# Patient Record
Sex: Female | Born: 1955 | Race: Black or African American | Hispanic: No | Smoking: Former smoker
Health system: Southern US, Community
[De-identification: ages and names within clinical notes are randomized; demographics above are authoritative.]

## PROBLEM LIST (undated history)

## (undated) DIAGNOSIS — E785 Hyperlipidemia, unspecified: Secondary | ICD-10-CM

## (undated) DIAGNOSIS — F329 Major depressive disorder, single episode, unspecified: Secondary | ICD-10-CM

## (undated) DIAGNOSIS — D219 Benign neoplasm of connective and other soft tissue, unspecified: Secondary | ICD-10-CM

## (undated) DIAGNOSIS — R0789 Other chest pain: Secondary | ICD-10-CM

## (undated) DIAGNOSIS — K635 Polyp of colon: Secondary | ICD-10-CM

## (undated) DIAGNOSIS — G4730 Sleep apnea, unspecified: Secondary | ICD-10-CM

## (undated) DIAGNOSIS — T7840XA Allergy, unspecified, initial encounter: Secondary | ICD-10-CM

## (undated) DIAGNOSIS — F410 Panic disorder [episodic paroxysmal anxiety] without agoraphobia: Secondary | ICD-10-CM

## (undated) DIAGNOSIS — F32A Depression, unspecified: Secondary | ICD-10-CM

## (undated) DIAGNOSIS — I10 Essential (primary) hypertension: Secondary | ICD-10-CM

## (undated) HISTORY — DX: Hyperlipidemia, unspecified: E78.5

## (undated) HISTORY — PX: CATARACT EXTRACTION: SUR2

## (undated) HISTORY — DX: Essential (primary) hypertension: I10

## (undated) HISTORY — DX: Major depressive disorder, single episode, unspecified: F32.9

## (undated) HISTORY — DX: Panic disorder (episodic paroxysmal anxiety): F41.0

## (undated) HISTORY — DX: Polyp of colon: K63.5

## (undated) HISTORY — DX: Benign neoplasm of connective and other soft tissue, unspecified: D21.9

## (undated) HISTORY — DX: Allergy, unspecified, initial encounter: T78.40XA

## (undated) HISTORY — DX: Depression, unspecified: F32.A

## (undated) HISTORY — DX: Other chest pain: R07.89

## (undated) HISTORY — DX: Sleep apnea, unspecified: G47.30

---

## 1996-11-21 HISTORY — PX: ABDOMINAL HYSTERECTOMY: SHX81

## 1998-09-22 ENCOUNTER — Ambulatory Visit (HOSPITAL_COMMUNITY): Admission: RE | Admit: 1998-09-22 | Discharge: 1998-09-22 | Payer: Self-pay | Admitting: *Deleted

## 1998-09-22 ENCOUNTER — Encounter: Payer: Self-pay | Admitting: *Deleted

## 1998-10-06 ENCOUNTER — Encounter: Payer: Self-pay | Admitting: *Deleted

## 1998-10-06 ENCOUNTER — Ambulatory Visit (HOSPITAL_COMMUNITY): Admission: RE | Admit: 1998-10-06 | Discharge: 1998-10-06 | Payer: Self-pay | Admitting: *Deleted

## 2001-07-02 ENCOUNTER — Other Ambulatory Visit: Admission: RE | Admit: 2001-07-02 | Discharge: 2001-07-02 | Payer: Self-pay | Admitting: Obstetrics and Gynecology

## 2003-04-23 ENCOUNTER — Ambulatory Visit (HOSPITAL_BASED_OUTPATIENT_CLINIC_OR_DEPARTMENT_OTHER): Admission: RE | Admit: 2003-04-23 | Discharge: 2003-04-23 | Payer: Self-pay | Admitting: Orthopedic Surgery

## 2004-07-13 ENCOUNTER — Emergency Department (HOSPITAL_COMMUNITY): Admission: EM | Admit: 2004-07-13 | Discharge: 2004-07-13 | Payer: Self-pay | Admitting: Emergency Medicine

## 2005-02-22 ENCOUNTER — Ambulatory Visit (HOSPITAL_COMMUNITY): Admission: RE | Admit: 2005-02-22 | Discharge: 2005-02-22 | Payer: Self-pay | Admitting: Gastroenterology

## 2005-02-22 ENCOUNTER — Encounter (INDEPENDENT_AMBULATORY_CARE_PROVIDER_SITE_OTHER): Payer: Self-pay | Admitting: *Deleted

## 2005-11-21 HISTORY — PX: NASAL SINUS SURGERY: SHX719

## 2005-12-20 ENCOUNTER — Encounter: Admission: RE | Admit: 2005-12-20 | Discharge: 2005-12-20 | Payer: Self-pay | Admitting: Family Medicine

## 2005-12-20 ENCOUNTER — Ambulatory Visit: Payer: Self-pay | Admitting: Licensed Clinical Social Worker

## 2005-12-26 ENCOUNTER — Ambulatory Visit: Payer: Self-pay | Admitting: Licensed Clinical Social Worker

## 2006-01-02 ENCOUNTER — Ambulatory Visit: Payer: Self-pay | Admitting: Licensed Clinical Social Worker

## 2006-01-06 ENCOUNTER — Ambulatory Visit: Payer: Self-pay | Admitting: Licensed Clinical Social Worker

## 2006-01-13 ENCOUNTER — Ambulatory Visit: Payer: Self-pay | Admitting: Licensed Clinical Social Worker

## 2006-01-20 ENCOUNTER — Ambulatory Visit: Payer: Self-pay | Admitting: Licensed Clinical Social Worker

## 2006-01-27 ENCOUNTER — Ambulatory Visit: Payer: Self-pay | Admitting: Licensed Clinical Social Worker

## 2006-02-03 ENCOUNTER — Ambulatory Visit: Payer: Self-pay | Admitting: Licensed Clinical Social Worker

## 2006-02-10 ENCOUNTER — Ambulatory Visit: Payer: Self-pay | Admitting: Licensed Clinical Social Worker

## 2006-02-17 ENCOUNTER — Ambulatory Visit: Payer: Self-pay | Admitting: Licensed Clinical Social Worker

## 2006-02-23 ENCOUNTER — Ambulatory Visit: Payer: Self-pay | Admitting: Licensed Clinical Social Worker

## 2006-03-03 ENCOUNTER — Ambulatory Visit: Payer: Self-pay | Admitting: Licensed Clinical Social Worker

## 2006-03-17 ENCOUNTER — Ambulatory Visit: Payer: Self-pay | Admitting: Licensed Clinical Social Worker

## 2006-03-24 ENCOUNTER — Ambulatory Visit: Payer: Self-pay | Admitting: Licensed Clinical Social Worker

## 2006-03-29 ENCOUNTER — Ambulatory Visit: Payer: Self-pay | Admitting: Licensed Clinical Social Worker

## 2006-04-21 ENCOUNTER — Ambulatory Visit: Payer: Self-pay | Admitting: Licensed Clinical Social Worker

## 2006-05-05 ENCOUNTER — Ambulatory Visit: Payer: Self-pay | Admitting: Licensed Clinical Social Worker

## 2006-08-25 ENCOUNTER — Ambulatory Visit: Payer: Self-pay | Admitting: Family Medicine

## 2006-11-23 ENCOUNTER — Ambulatory Visit: Payer: Self-pay | Admitting: Licensed Clinical Social Worker

## 2006-12-21 ENCOUNTER — Ambulatory Visit: Payer: Self-pay | Admitting: Family Medicine

## 2007-01-19 ENCOUNTER — Ambulatory Visit: Payer: Self-pay | Admitting: Family Medicine

## 2007-01-20 DIAGNOSIS — R0789 Other chest pain: Secondary | ICD-10-CM

## 2007-01-20 HISTORY — DX: Other chest pain: R07.89

## 2007-01-25 ENCOUNTER — Ambulatory Visit: Payer: Self-pay | Admitting: Cardiovascular Disease

## 2007-01-26 ENCOUNTER — Ambulatory Visit: Payer: Self-pay | Admitting: Family Medicine

## 2007-01-26 LAB — CONVERTED CEMR LAB
Cholesterol: 204 mg/dL (ref 0–200)
Direct LDL: 140.5 mg/dL
HDL: 46.3 mg/dL (ref >39.0)
Total CHOL/HDL Ratio: 4.4
Triglycerides: 46 mg/dL (ref 0–149)
VLDL: 9 mg/dL (ref 0–40)

## 2007-02-02 ENCOUNTER — Ambulatory Visit: Payer: Self-pay

## 2007-06-08 ENCOUNTER — Ambulatory Visit: Payer: Self-pay | Admitting: Licensed Clinical Social Worker

## 2007-06-15 ENCOUNTER — Ambulatory Visit: Payer: Self-pay | Admitting: Licensed Clinical Social Worker

## 2007-06-22 ENCOUNTER — Ambulatory Visit: Payer: Self-pay | Admitting: Licensed Clinical Social Worker

## 2007-06-22 ENCOUNTER — Telehealth (INDEPENDENT_AMBULATORY_CARE_PROVIDER_SITE_OTHER): Payer: Self-pay | Admitting: *Deleted

## 2007-06-29 ENCOUNTER — Ambulatory Visit: Payer: Self-pay | Admitting: Licensed Clinical Social Worker

## 2007-07-06 ENCOUNTER — Ambulatory Visit: Payer: Self-pay | Admitting: Licensed Clinical Social Worker

## 2007-11-07 ENCOUNTER — Telehealth (INDEPENDENT_AMBULATORY_CARE_PROVIDER_SITE_OTHER): Payer: Self-pay | Admitting: *Deleted

## 2007-11-13 ENCOUNTER — Telehealth (INDEPENDENT_AMBULATORY_CARE_PROVIDER_SITE_OTHER): Payer: Self-pay | Admitting: Family Medicine

## 2007-11-13 ENCOUNTER — Ambulatory Visit: Payer: Self-pay | Admitting: Family Medicine

## 2007-11-13 DIAGNOSIS — F329 Major depressive disorder, single episode, unspecified: Secondary | ICD-10-CM | POA: Insufficient documentation

## 2007-12-12 ENCOUNTER — Telehealth (INDEPENDENT_AMBULATORY_CARE_PROVIDER_SITE_OTHER): Payer: Self-pay | Admitting: *Deleted

## 2008-03-05 ENCOUNTER — Ambulatory Visit: Payer: Self-pay | Admitting: Internal Medicine

## 2008-03-05 DIAGNOSIS — I10 Essential (primary) hypertension: Secondary | ICD-10-CM | POA: Insufficient documentation

## 2008-03-05 DIAGNOSIS — R5383 Other fatigue: Secondary | ICD-10-CM | POA: Insufficient documentation

## 2008-03-05 DIAGNOSIS — R5381 Other malaise: Secondary | ICD-10-CM | POA: Insufficient documentation

## 2008-03-08 LAB — CONVERTED CEMR LAB
BUN: 11 mg/dL (ref 6–23)
Basophils Absolute: 0 10*3/uL (ref 0.0–0.1)
Creatinine, Ser: 0.8 mg/dL (ref 0.4–1.2)
Free T4: 0.9 ng/dL (ref 0.6–1.6)
HCT: 41 % (ref 36.0–46.0)
MCV: 92.6 fL (ref 78.0–100.0)
Neutro Abs: 1.9 10*3/uL (ref 1.4–7.7)
Platelets: 292 10*3/uL (ref 150–400)
Potassium: 3.8 meq/L (ref 3.5–5.1)
RBC: 4.43 M/uL (ref 3.87–5.11)
TSH: 1.45 microintl units/mL (ref 0.35–5.50)
WBC: 4.3 10*3/uL — ABNORMAL LOW (ref 4.5–10.5)

## 2008-03-10 ENCOUNTER — Encounter (INDEPENDENT_AMBULATORY_CARE_PROVIDER_SITE_OTHER): Payer: Self-pay | Admitting: *Deleted

## 2008-03-24 ENCOUNTER — Encounter: Admission: RE | Admit: 2008-03-24 | Discharge: 2008-03-24 | Payer: Self-pay | Admitting: Internal Medicine

## 2008-03-24 ENCOUNTER — Encounter: Payer: Self-pay | Admitting: Internal Medicine

## 2008-05-26 ENCOUNTER — Ambulatory Visit: Payer: Self-pay | Admitting: Licensed Clinical Social Worker

## 2008-06-03 ENCOUNTER — Ambulatory Visit: Payer: Self-pay | Admitting: Licensed Clinical Social Worker

## 2008-06-10 ENCOUNTER — Ambulatory Visit: Payer: Self-pay | Admitting: Licensed Clinical Social Worker

## 2008-06-20 ENCOUNTER — Ambulatory Visit: Payer: Self-pay | Admitting: Licensed Clinical Social Worker

## 2008-06-27 ENCOUNTER — Ambulatory Visit: Payer: Self-pay | Admitting: Licensed Clinical Social Worker

## 2008-06-27 ENCOUNTER — Ambulatory Visit: Payer: Self-pay | Admitting: *Deleted

## 2008-07-18 ENCOUNTER — Ambulatory Visit: Payer: Self-pay | Admitting: Licensed Clinical Social Worker

## 2008-08-07 ENCOUNTER — Ambulatory Visit: Payer: Self-pay | Admitting: Licensed Clinical Social Worker

## 2008-08-19 ENCOUNTER — Ambulatory Visit: Payer: Self-pay | Admitting: Licensed Clinical Social Worker

## 2008-08-26 ENCOUNTER — Ambulatory Visit: Payer: Self-pay | Admitting: Licensed Clinical Social Worker

## 2008-08-27 ENCOUNTER — Ambulatory Visit: Payer: Self-pay | Admitting: *Deleted

## 2008-10-22 ENCOUNTER — Ambulatory Visit: Payer: Self-pay | Admitting: Licensed Clinical Social Worker

## 2008-10-24 ENCOUNTER — Ambulatory Visit: Payer: Self-pay | Admitting: Licensed Clinical Social Worker

## 2008-12-09 ENCOUNTER — Ambulatory Visit: Payer: Self-pay | Admitting: *Deleted

## 2008-12-09 DIAGNOSIS — G4700 Insomnia, unspecified: Secondary | ICD-10-CM | POA: Insufficient documentation

## 2008-12-09 DIAGNOSIS — E7800 Pure hypercholesterolemia, unspecified: Secondary | ICD-10-CM | POA: Insufficient documentation

## 2008-12-09 DIAGNOSIS — L259 Unspecified contact dermatitis, unspecified cause: Secondary | ICD-10-CM | POA: Insufficient documentation

## 2008-12-09 LAB — CONVERTED CEMR LAB
Basophils Absolute: 0 10*3/uL (ref 0.0–0.1)
Basophils Relative: 0.1 % (ref 0.0–3.0)
CO2: 35 meq/L — ABNORMAL HIGH (ref 19–32)
Chloride: 107 meq/L (ref 96–112)
Creatinine, Ser: 0.8 mg/dL (ref 0.4–1.2)
Eosinophils Absolute: 0.1 10*3/uL (ref 0.0–0.7)
GFR calc non Af Amer: 79 mL/min
Lymphocytes Relative: 45.5 % (ref 12.0–46.0)
MCHC: 34.3 g/dL (ref 30.0–36.0)
MCV: 92.4 fL (ref 78.0–100.0)
Neutrophils Relative %: 46.3 % (ref 43.0–77.0)
Platelets: 267 10*3/uL (ref 150–400)
RDW: 13.2 % (ref 11.5–14.6)
Sodium: 144 meq/L (ref 135–145)
TSH: 1.17 microintl units/mL (ref 0.35–5.50)

## 2008-12-10 DIAGNOSIS — D72819 Decreased white blood cell count, unspecified: Secondary | ICD-10-CM | POA: Insufficient documentation

## 2009-01-02 ENCOUNTER — Ambulatory Visit: Payer: Self-pay | Admitting: Licensed Clinical Social Worker

## 2009-01-09 ENCOUNTER — Ambulatory Visit: Payer: Self-pay | Admitting: Licensed Clinical Social Worker

## 2009-01-20 ENCOUNTER — Ambulatory Visit: Payer: Self-pay | Admitting: Licensed Clinical Social Worker

## 2009-03-10 ENCOUNTER — Ambulatory Visit: Payer: Self-pay | Admitting: Family Medicine

## 2009-03-11 LAB — CONVERTED CEMR LAB
AST: 22 units/L (ref 0–37)
Albumin: 3.8 g/dL (ref 3.5–5.2)
Alkaline Phosphatase: 52 units/L (ref 39–117)
Cholesterol: 228 mg/dL — ABNORMAL HIGH (ref 0–200)
HDL: 47.4 mg/dL (ref >39.00)
Total Bilirubin: 0.9 mg/dL (ref 0.3–1.2)
Total CHOL/HDL Ratio: 5
VLDL: 10.2 mg/dL (ref 0.0–40.0)

## 2009-03-23 LAB — CONVERTED CEMR LAB: Pap Smear: NORMAL

## 2009-04-02 ENCOUNTER — Encounter: Admission: RE | Admit: 2009-04-02 | Discharge: 2009-04-02 | Payer: Self-pay | Admitting: *Deleted

## 2009-07-15 ENCOUNTER — Telehealth: Payer: Self-pay | Admitting: Family Medicine

## 2010-01-25 ENCOUNTER — Telehealth: Payer: Self-pay | Admitting: Family

## 2010-02-01 ENCOUNTER — Ambulatory Visit: Payer: Self-pay | Admitting: Family

## 2010-02-01 LAB — CONVERTED CEMR LAB
CO2: 28 meq/L (ref 19–32)
Calcium: 9.7 mg/dL (ref 8.4–10.5)
Creatinine, Ser: 0.87 mg/dL (ref 0.40–1.20)
Glucose, Bld: 90 mg/dL (ref 70–99)

## 2010-02-09 ENCOUNTER — Telehealth (INDEPENDENT_AMBULATORY_CARE_PROVIDER_SITE_OTHER): Payer: Self-pay | Admitting: *Deleted

## 2010-02-22 ENCOUNTER — Encounter: Payer: Self-pay | Admitting: Family

## 2010-02-23 LAB — CONVERTED CEMR LAB
ALT: 17 units/L (ref 0–35)
Albumin: 4.1 g/dL (ref 3.5–5.2)
BUN: 12 mg/dL (ref 6–23)
Basophils Absolute: 0 10*3/uL (ref 0.0–0.1)
Cholesterol: 214 mg/dL — ABNORMAL HIGH (ref 0–200)
HDL: 44 mg/dL (ref >39)
Hemoglobin, Urine: NEGATIVE
Indirect Bilirubin: 0.2 mg/dL (ref 0.0–0.9)
Leukocytes, UA: NEGATIVE
Lymphocytes Relative: 53 % — ABNORMAL HIGH (ref 12–46)
Lymphs Abs: 2.1 10*3/uL (ref 0.7–4.0)
Neutro Abs: 1.5 10*3/uL — ABNORMAL LOW (ref 1.7–7.7)
Platelets: 237 10*3/uL (ref 150–400)
Potassium: 4.2 meq/L (ref 3.5–5.3)
Protein, ur: NEGATIVE mg/dL
RDW: 14.3 % (ref 11.5–15.5)
Sodium: 143 meq/L (ref 135–145)
Total CHOL/HDL Ratio: 4.9
Total Protein: 6.4 g/dL (ref 6.0–8.3)
Triglycerides: 61 mg/dL (ref <150)
Urine Glucose: NEGATIVE mg/dL
VLDL: 12 mg/dL (ref 0–40)
WBC: 4 10*3/uL (ref 4.0–10.5)

## 2010-03-01 ENCOUNTER — Ambulatory Visit: Payer: Self-pay | Admitting: Family

## 2010-03-01 DIAGNOSIS — F17200 Nicotine dependence, unspecified, uncomplicated: Secondary | ICD-10-CM | POA: Insufficient documentation

## 2010-03-02 ENCOUNTER — Encounter (INDEPENDENT_AMBULATORY_CARE_PROVIDER_SITE_OTHER): Payer: Self-pay | Admitting: *Deleted

## 2010-03-15 ENCOUNTER — Encounter (INDEPENDENT_AMBULATORY_CARE_PROVIDER_SITE_OTHER): Payer: Self-pay | Admitting: *Deleted

## 2010-03-16 ENCOUNTER — Ambulatory Visit: Payer: Self-pay | Admitting: Internal Medicine

## 2010-03-19 ENCOUNTER — Ambulatory Visit: Payer: Self-pay | Admitting: Family

## 2010-03-29 ENCOUNTER — Telehealth: Payer: Self-pay | Admitting: Family

## 2010-03-29 ENCOUNTER — Ambulatory Visit: Payer: Self-pay | Admitting: Family

## 2010-03-29 LAB — CONVERTED CEMR LAB
ALT: 17 units/L (ref 0–35)
AST: 18 units/L (ref 0–37)
Albumin: 3.6 g/dL (ref 3.5–5.2)

## 2010-03-30 ENCOUNTER — Ambulatory Visit: Payer: Self-pay | Admitting: Internal Medicine

## 2010-03-30 HISTORY — PX: COLONOSCOPY: SHX174

## 2010-03-31 ENCOUNTER — Encounter: Payer: Self-pay | Admitting: Internal Medicine

## 2010-05-27 ENCOUNTER — Encounter: Admission: RE | Admit: 2010-05-27 | Discharge: 2010-05-27 | Payer: Self-pay | Admitting: Internal Medicine

## 2010-05-31 ENCOUNTER — Ambulatory Visit: Payer: Self-pay | Admitting: Family

## 2010-06-17 ENCOUNTER — Encounter: Payer: Self-pay | Admitting: Family

## 2010-06-17 LAB — CONVERTED CEMR LAB
ALT: 17 units/L (ref 0–35)
AST: 16 units/L (ref 0–37)
Albumin: 4 g/dL (ref 3.5–5.2)
CO2: 26 meq/L (ref 19–32)
Calcium: 9.3 mg/dL (ref 8.4–10.5)
Cholesterol: 172 mg/dL (ref 0–200)
HDL: 49 mg/dL (ref >39)
Sodium: 143 meq/L (ref 135–145)
Total CHOL/HDL Ratio: 3.5
Uric Acid, Serum: 4.4 mg/dL (ref 2.4–7.0)

## 2010-06-22 ENCOUNTER — Encounter: Payer: Self-pay | Admitting: Family

## 2010-08-30 ENCOUNTER — Ambulatory Visit: Payer: Self-pay | Admitting: Family

## 2010-10-01 ENCOUNTER — Ambulatory Visit: Payer: Self-pay | Admitting: Family

## 2010-10-01 LAB — CONVERTED CEMR LAB
Calcium: 9.6 mg/dL (ref 8.4–10.5)
Creatinine, Ser: 0.78 mg/dL (ref 0.40–1.20)

## 2010-10-04 ENCOUNTER — Encounter: Payer: Self-pay | Admitting: Family

## 2010-12-23 NOTE — Assessment & Plan Note (Signed)
Summary: 3 month follow up/mhf   Vital Signs:  Patient profile:   55 year old female Weight:      153.50 pounds BMI:     26.44 O2 Sat:      97 % on Room air Temp:     98.8 degrees F oral Pulse rate:   75 / minute Pulse rhythm:   regular Resp:     16 per minute BP sitting:   110 / 72  (right arm) Cuff size:   large  Vitals Entered By: Glendell Docker CMA (May 31, 2010 8:20 AM)  O2 Flow:  Room air CC: Rm 5- 3 Month Follow up  Is Patient Diabetic? No Pain Assessment Patient in pain? no      Comments medication refill on Pravastatin   Primary Care Provider:  Seymour Bars DO  CC:  Rm 5- 3 Month Follow up .  History of Present Illness: Tiffany Gonzales is a 55 year old female who presents today for follow up.  1)HTN- she reports compliance with BP meds, denies HA or LE edema, watching sodium intake.  2)  Cholesterol- tolerating Pravastatin- some diarrhea.    3) Depression- stable, no complaints.    Preventive Screening-Counseling & Management  Alcohol-Tobacco     Smoking Status: current  Allergies (verified): No Known Drug Allergies  Review of Systems       reports resolution of swelling beneath jaw.  Physical Exam  General:  Well-developed,well-nourished,in no acute distress; alert,appropriate and cooperative throughout examination Lungs:  Normal respiratory effort, chest expands symmetrically. Lungs are clear to auscultation, no crackles or wheezes. Heart:  Normal rate and regular rhythm. S1 and S2 normal without gallop, murmur, click, rub or other extra sounds. Extremities:  No peripheral edema noted.   Impression & Recommendations:  Problem # 1:  HYPERCHOLESTEROLEMIA (ICD-272.0) Assessment Comment Only Will check FLP and LFT's.   Her updated medication list for this problem includes:    Pravastatin Sodium 40 Mg Tabs (Pravastatin sodium) ..... One tablet by mouth daily at bedtime  Orders: T-Hepatic Function [id number]) T-Lipid Profile  [phone])  Problem # 2:  HYPERTENSION (ICD-401.9) Assessment: Improved Check f/u BMET- BP is improved.  Her updated medication list for this problem includes:    Lisinopril-hydrochlorothiazide 20-12.5 Mg Tabs (Lisinopril-hydrochlorothiazide) ..... One tablet by mouth daily  Orders: T-Basic Metabolic Panel [id number])  BP today: 110/72 Prior BP: 130/80 (03/19/2010)  Labs Reviewed: K+: 4.2 (02/22/2010) Creat: : 0.78 (02/22/2010)   Chol: 214 (02/22/2010)   HDL: 44 (02/22/2010)   LDL: 158 (02/22/2010)   TG: 61 (02/22/2010)  Problem # 3:  DEPRESSIVE DISORDER (ICD-311) Assessment: Improved Stable per patient- was situational in nature.  Complete Medication List: 1)  Ambien 10 Mg Tabs (Zolpidem tartrate) .... Take one tablet every 3rd night as needed only 2)  Lisinopril-hydrochlorothiazide 20-12.5 Mg Tabs (Lisinopril-hydrochlorothiazide) .... One tablet by mouth daily 3)  Bayer Aspirin Ec Low Dose 81 Mg Tbec (Aspirin) .... Take 1 tablet by mouth once a day 4)  Estrace Cream (estradiol)  .... Take 1 tablet three times a week 5)  Pravastatin Sodium 40 Mg Tabs (Pravastatin sodium) .... One tablet by mouth daily at bedtime  Patient Instructions: 1)  Please return fasting this week to complete your lab work. 2)  Follow up in 3 months.   3)  Have a nice Summer!  Current Allergies (reviewed today): No known allergies

## 2010-12-23 NOTE — Assessment & Plan Note (Signed)
Summary: 3 month follow up/mhfg--rm 4   Vital Signs:  Patient profile:   55 year old female Height:      64 inches Weight:      151 pounds BMI:     26.01 Temp:     98.3 degrees F oral Pulse rate:   72 / minute Pulse rhythm:   regular Resp:     16 per minute BP sitting:   100 / 70  (right arm) Cuff size:   regular  Vitals Entered By: Mervin Kung CMA Duncan Dull) (August 30, 2010 8:28 AM) CC: Rm 4   3 month f/u., Hypertension Management Is Patient Diabetic? No Pain Assessment Patient in pain? no      Comments Pt would like explanation of last labs. Needs refill on Ambien. Nicki Guadalajara Fergerson CMA Duncan Dull)  August 30, 2010 8:33 AM    Primary Care Provider:  Lemont Fillers FNP  CC:  Rm 4   3 month f/u. and Hypertension Management.  History of Present Illness: Tiffany Gonzales is a 55 year old female who presents today for follow up.  1.HTN:  Reports + compliance with medications.  See below.  2. Hyperlipidemia- tolerating statin. Denies muscle pain.  + med compliance.  3. Insomnia- well controlled.  Rarely needing ambien.  Hypertension History:      She denies headache, chest pain, palpitations, and dyspnea with exertion.        Positive major cardiovascular risk factors include female age 2 years old or older, hyperlipidemia, hypertension, and current tobacco user.  Negative major cardiovascular risk factors include negative family history for ischemic heart disease.     Allergies (verified): No Known Drug Allergies  Physical Exam  General:  Well-developed,well-nourished,in no acute distress; alert,appropriate and cooperative throughout examination Head:  Normocephalic and atraumatic without obvious abnormalities. No apparent alopecia or balding. Lungs:  Normal respiratory effort, chest expands symmetrically. Lungs are clear to auscultation, no crackles or wheezes. Heart:  Normal rate and regular rhythm. S1 and S2 normal without gallop, murmur, click, rub or other extra  sounds. Extremities:  No clubbing, cyanosis, edema, or deformity noted with normal full range of motion of all joints.   Psych:  Cognition and judgment appear intact. Alert and cooperative with normal attention span and concentration. No apparent delusions, illusions, hallucinations   Impression & Recommendations:  Problem # 1:  HYPERTENSION (ICD-401.9) Assessment Unchanged BP is on low side.  Will remove HCTZ from lisinopril/hctz combo.  F/u in 1 month for BP check.   Her updated medication list for this problem includes:    Lisinopril 20 Mg Tabs (Lisinopril) ..... One tablet by mouth once daily  BP today: 100/70 Prior BP: 110/72 (05/31/2010)  Labs Reviewed: K+: 3.9 (06/17/2010) Creat: : 0.82 (06/17/2010)   Chol: 172 (06/17/2010)   HDL: 49 (06/17/2010)   LDL: 115 (06/17/2010)   TG: 38 (06/17/2010)  BP today: 100/70 Prior BP: 110/72 (05/31/2010)  10 Yr Risk Heart Disease: 8 %  Labs Reviewed: K+: 3.9 (06/17/2010) Creat: : 0.82 (06/17/2010)   Chol: 172 (06/17/2010)   HDL: 49 (06/17/2010)   LDL: 115 (06/17/2010)   TG: 38 (06/17/2010)  Problem # 2:  HYPERCHOLESTEROLEMIA (ICD-272.0) Assessment: Improved Stable, continue pravastatin Her updated medication list for this problem includes:    Pravastatin Sodium 40 Mg Tabs (Pravastatin sodium) ..... One tablet by mouth daily at bedtime  Labs Reviewed: SGOT: 16 (06/17/2010)   SGPT: 17 (06/17/2010)  10 Yr Risk Heart Disease: 8 %   HDL:49 (06/17/2010), 44 (  02/22/2010)  LDL:115 (06/17/2010), 158 (02/22/2010)  Chol:172 (06/17/2010), 214 (02/22/2010)  Trig:38 (06/17/2010), 61 (02/22/2010)  Problem # 3:  INSOMNIA (ICD-780.52) Assessment: Improved Continue as needed ambien, well controlled per patient. Her updated medication list for this problem includes:    Ambien 10 Mg Tabs (Zolpidem tartrate) .Marland Kitchen... Take one tablet every 3rd night as needed only  Complete Medication List: 1)  Ambien 10 Mg Tabs (Zolpidem tartrate) .... Take one tablet  every 3rd night as needed only 2)  Lisinopril 20 Mg Tabs (Lisinopril) .... One tablet by mouth once daily 3)  Bayer Aspirin Ec Low Dose 81 Mg Tbec (Aspirin) .... Take 1 tablet by mouth once a day 4)  Estrace Cream (estradiol)  .... Take 1 tablet three times a week 5)  Pravastatin Sodium 40 Mg Tabs (Pravastatin sodium) .... One tablet by mouth daily at bedtime  Hypertension Assessment/Plan:      The patient's hypertensive risk group is category B: At least one risk factor (excluding diabetes) with no target organ damage.  Her calculated 10 year risk of coronary heart disease is 8 %.  Today's blood pressure is 100/70.    Patient Instructions: 1)  Please follow up in 1 month for a blood pressure check. Prescriptions: AMBIEN 10 MG  TABS (ZOLPIDEM TARTRATE) Take one tablet every 3rd night as needed only  #30 x 0   Entered and Authorized by:   Lemont Fillers FNP   Signed by:   Lemont Fillers FNP on 08/30/2010   Method used:   Print then Give to Patient   RxID:   [id number] PRAVASTATIN SODIUM 40 MG TABS (PRAVASTATIN SODIUM) one tablet by mouth daily at bedtime  #30 Tablet x 3   Entered and Authorized by:   Lemont Fillers FNP   Signed by:   Lemont Fillers FNP on 08/30/2010   Method used:   [facility] to        [facility] [phone][facility] (retail)       [address]       [place], [place]  [address]       Ph: [phone]       Fax: [phone]   RxID:   [id number] LISINOPRIL 20 MG TABS (LISINOPRIL) one tablet by mouth once daily  #30 x 3   Entered and Authorized by:   Lemont Fillers FNP   Signed by:   Lemont Fillers FNP on 08/30/2010   Method used:   [facility] to        [facility] [phone]* (retail)       [address]       [place], [place]  [address]       Ph: [phone]       Fax: [phone]   RxID:   [id number]   Current Allergies (reviewed today): No known  allergies

## 2010-12-23 NOTE — Miscellaneous (Signed)
Summary: Orders Update  Clinical Lists Changes  Problems: Added new problem of PREVENTIVE HEALTH CARE (ICD-V70.0) Orders: Added new Test order of TLB-CBC Platelet - w/Differential (85025-CBCD) - Signed Added new Test order of TLB-Hepatic/Liver Function Pnl (80076-HEPATIC) - Signed Added new Test order of TLB-Lipid Panel (80061-LIPID) - Signed Added new Test order of TLB-BMP (Basic Metabolic Panel-BMET) (80048-METABOL) - Signed Added new Test order of TLB-TSH (Thyroid Stimulating Hormone) (84443-TSH) - Signed Added new Test order of T-Urinalysis ([id number]) - Signed

## 2010-12-23 NOTE — Letter (Signed)
Summary: [facility] Instructions  [facility]  [address] [place], [place] [address]   Phone: [phone]  Fax: [phone]       [facility]    [birth year]    MRN: [id number]       Procedure Day /Date: Tuesday 03/30/10     Arrival Time:  9:00 a.m.     Procedure Time: 10:00 a.m.     Location of Procedure:                    _x_  [facility] (4th Floor)    PREPARATION FOR COLONOSCOPY WITH MIRALAX  Starting 5 days prior to your procedure  03/25/10 do not eat nuts, seeds, popcorn, corn, beans, peas,  salads, or any raw vegetables.  Do not take any fiber supplements (e.g. Metamucil, Citrucel, and Benefiber). ____________________________________________________________________________________________________   THE DAY BEFORE YOUR PROCEDURE         DATE: 03/29/10  DAY:  Monday  1   Drink clear liquids the entire day-NO SOLID FOOD  2   Do not drink anything colored red or purple.  Avoid juices with pulp.  No orange juice.  3   Drink at least 64 oz. (8 glasses) of fluid/clear liquids during the day to prevent dehydration and help the prep work efficiently.  CLEAR LIQUIDS INCLUDE: Water Jello Ice Popsicles Tea (sugar ok, no milk/cream) Powdered fruit flavored drinks Coffee (sugar ok, no milk/cream) Gatorade Juice: apple, white grape, white cranberry  Lemonade Clear bullion, consomm, broth Carbonated beverages (any kind) Strained chicken noodle soup Hard Candy  4   Mix the entire bottle of Miralax with 64 oz. of Gatorade/Powerade in the morning and put in the refrigerator to chill.  5   At 3:00 pm take 2 Dulcolax/Bisacodyl tablets.  6   At 4:30 pm take one Reglan/Metoclopramide tablet.  7  Starting at 5:00 pm drink one 8 oz glass of the Miralax mixture every 15-20 minutes until you have finished drinking the entire 64 oz.  You should finish drinking prep around 7:30 or 8:00 pm.  8   If you are nauseated, you may take the 2nd  Reglan/Metoclopramide tablet at 6:30 pm.        9    At 8:00 pm take 2 more DULCOLAX/Bisacodyl tablets.     THE DAY OF YOUR PROCEDURE      DATE:  03/30/10  DAY: Tuesday  You may drink clear liquids until  8:00 a.m. (2 HOURS BEFORE PROCEDURE).   MEDICATION INSTRUCTIONS  Unless otherwise instructed, you should take regular prescription medications with a small sip of water as early as possible the morning of your procedure.           OTHER INSTRUCTIONS  You will need a responsible adult at least 57 years of age to accompany you and drive you home.   This person must remain in the waiting room during your procedure.  Wear loose fitting clothing that is easily removed.  Leave jewelry and other valuables at home.  However, you may wish to bring a book to read or an iPod/MP3 player to listen to music as you wait for your procedure to start.  Remove all body piercing jewelry and leave at home.  Total time from sign-in until discharge is approximately 2-3 hours.  You should go home directly after your procedure and rest.  You can resume normal activities the day after your procedure.  The day of your procedure you should not:  Drive   Make legal decisions   Operate machinery   Drink alcohol   Return to work  You will receive specific instructions about eating, activities and medications before you leave.   The above instructions have been reviewed and explained to me by   Clide Cliff, RN_______________________    I fully understand and can verbalize these instructions _____________________________ Date _______

## 2010-12-23 NOTE — Miscellaneous (Signed)
Summary: added uric acid level  Clinical Lists Changes  Orders: Added new Test order of T-Uric Acid (Blood) [phone]) - Signed

## 2010-12-23 NOTE — Letter (Signed)
   [place] [facility] [address] [place], [place]  [address]  [place] Phone: [phone]      June 22, 2010   [facility] [address] [place], [place] [address]  RE:  LAB RESULTS  Dear  Ms. Starrett,  The following is an interpretation of your most recent lab tests.  Please take note of any instructions provided or changes to medications that have resulted from your lab work.  ELECTROLYTES:  Good - no changes needed  KIDNEY FUNCTION TESTS:  Good - no changes needed  LIVER FUNCTION TESTS:  Good - no changes needed  LIPID PANEL:  Good - no changes needed Triglyceride: 38   Cholesterol: 172   LDL: 115   HDL: 49   Chol/HDL%:  3.5 Ratio   DIABETIC STUDIES:  Excellent - no changes needed Blood Glucose: 90   Also, your uric acid level was normal.  No sign of gout playing a role in your swelling. Please follow up in October as scheduled- sooner if problems or concerns.   Sincerely Yours,    Lemont Fillers FNP  Appended Document:  Mailed.

## 2010-12-23 NOTE — Letter (Signed)
Summary: Patient Notice- Polyp Results  [facility]  [address] [place], [place] [address]   Phone: [phone]  Fax: [phone]        Mar 31, 2010 MRN: [id number]    [facility] [address] [place], [place]  [address]    Dear Ms. Schendel,  I am pleased to inform you that the colon polyp(s) removed during your recent colonoscopy was (were) found to be benign (no cancer detected) upon pathologic examination.The polyps were hyperplastic ( not precancerous)  I recommend you have a repeat colonoscopy examination in _7 years to look for recurrent polyps, as having colon polyps increases your risk for having recurrent polyps or even colon cancer in the future.  Should you develop new or worsening symptoms of abdominal pain, bowel habit changes or bleeding from the rectum or bowels, please schedule an evaluation with either your primary care physician or with me.  Additional information/recommendations:  _x_ No further action with gastroenterology is needed at this time. Please      follow-up with your primary care physician for your other healthcare      needs.  __ Please call [phone] to schedule a return visit to review your      situation.  __ Please keep your follow-up visit as already scheduled.  __ Continue treatment plan as outlined the day of your exam.  Please call us if you are having persistent problems or have questions about your condition that have not been fully answered at this time.  Sincerely,  Hart Carwin MD  This letter has been electronically signed by your physician.  Appended Document: Patient Notice- Polyp Results letter mailed

## 2010-12-23 NOTE — Progress Notes (Signed)
Summary: status update  Phone Note Outgoing Call   Summary of Call: Noted that patient did not follow up.  LFT's normal.  Left message for patient to return my call.  Will ask her how her neck pain/swelling is when she returns my call. Initial call taken by: Lemont Fillers FNP,  Mar 29, 2010 3:00 PM  Follow-up for Phone Call        Pt states neck pain and swelling have resolved. Notified pt. that LFTs were normal.  Mervin Kung CMA  Mar 29, 2010 3:28 PM

## 2010-12-23 NOTE — Assessment & Plan Note (Signed)
Summary: BP CHECK NURSE VISIT/MHF  Nurse Visit   Vital Signs:  Patient profile:   55 year old female BP sitting:   124 / 90  (right arm) Cuff size:   regular  Vitals Entered By: Glendell Docker CMA (October 01, 2010 1:08 PM) Comments patient presented today for blood pressure check. She was advised to continue with curret dose , bmet checked today.patient was advised to schedule a 3 month follow up per Harless Litten CMA  October 01, 2010 1:13 PM     Allergies: No Known Drug Allergies  Orders Added: 1)  T-Basic Metabolic Panel [phone]

## 2010-12-23 NOTE — Assessment & Plan Note (Signed)
Summary: new to est, needs refill on BP med,Old Dr.Wilson pt- jr   Vital Signs:  Patient profile:   55 year old female Weight:      156.75 pounds BMI:     27.00 O2 Sat:      98 % on Room air Temp:     98.1 degrees F oral Pulse rate:   94 / minute Pulse rhythm:   regular Resp:     12 per minute BP sitting:   118 / 80  (right arm) Cuff size:   regular  Vitals Entered By: Mervin Kung CMA (February 01, 2010 1:40 PM)  O2 Flow:  Room air CC: room 5  Needs to establish new pmd. Is Patient Diabetic? No Comments Pt is requesting refill on Lisinopril-HCTZ.   Primary Care Provider:  Seymour Bars DO  CC:  room 5  Needs to establish new pmd..  History of Present Illness: Tiffany Gonzales is a 55 year old female who presents today for follow up.  She presents today for follow up of her HTN.  Notes that she has not taken pravastatin due to "being afraid of the liver damage."  Has not been compliant with low sodium/low cholesterol diet.    Depression/anxiety- Notes that this has improved sincer her GYN placed her on Estrace.    Preventive Screening-Counseling & Management  Alcohol-Tobacco     Smoking Status: current     Packs/Day: 0.5  Caffeine-Diet-Exercise     Caffeine use/day: 1 cup of coffee daily     Does Patient Exercise: yes     Type of exercise: cardio     Exercise (avg: min/session): 30-60     Times/week: 3  Allergies (verified): No Known Drug Allergies  Social History: Packs/Day:  0.5 Caffeine use/day:  1 cup of coffee daily Does Patient Exercise:  yes  Physical Exam  General:  Well-developed,well-nourished,in no acute distress; alert,appropriate and cooperative throughout examination Lungs:  Normal respiratory effort, chest expands symmetrically. Lungs are clear to auscultation, no crackles or wheezes. Heart:  Normal rate and regular rhythm. S1 and S2 normal without gallop, murmur, click, rub or other extra sounds. Extremities:  No clubbing, cyanosis, edema, or  deformity noted with normal full range of motion of all joints.     Impression & Recommendations:  Problem # 1:  HYPERCHOLESTEROLEMIA (ICD-272.0) Assessment Comment Only Pt tells me that she has never taken pravastatin.  Will plan to have patient return fasting for FLP- long discusssion with pt re: long term risks of untreated hypercholesterolemia.  She is agreeable to trial of statin. Patient counseled on low cholesterol diet.   Will  address at next visit.   The following medications were removed from the medication list:    Pravastatin Sodium 40 Mg Tabs (Pravastatin sodium) .Marland Kitchen... 1 tab by mouth qhs  Problem # 2:  HYPERTENSION (ICD-401.9) Assessment: Comment Only Pt has been taking lisinopril-hctz 20/12.5 will continue this dose as BP looks good.  Check BMET.  Pt counseled on low sodium diet.   Her updated medication list for this problem includes:    Lisinopril-hydrochlorothiazide 20-12.5 Mg Tabs (Lisinopril-hydrochlorothiazide) ..... One tablet by mouth daily  BP today: 118/80 Prior BP: 105/83 (03/10/2009)  Labs Reviewed: K+: 4.1 (12/09/2008) Creat: : 0.8 (12/09/2008)   Chol: 228 (03/10/2009)   HDL: 47.40 (03/10/2009)   LDL: DEL (01/26/2007)   TG: 51.0 (03/10/2009)  Complete Medication List: 1)  Ambien 10 Mg Tabs (Zolpidem tartrate) .... Take one tablet every 3rd night as needed only  2)  Lisinopril-hydrochlorothiazide 20-12.5 Mg Tabs (Lisinopril-hydrochlorothiazide) .... One tablet by mouth daily 3)  Bayer Aspirin Ec Low Dose 81 Mg Tbec (Aspirin) .... Take 1 tablet by mouth once a day 4)  Estrace Tabs (estradiol)  .... Take 1 tablet three times a week  Other Orders: Venipuncture ([address]) TLB-BMP (Basic Metabolic Panel-BMET) (80048-METABOL)  Patient Instructions: 1)  Please return fasting for a Complete physical exam. 2)  Please work hard on a low sodium/low cholesterol diet.   Prescriptions: LISINOPRIL-HYDROCHLOROTHIAZIDE 20-12.5 MG TABS (LISINOPRIL-HYDROCHLOROTHIAZIDE) one  tablet by mouth daily  #30 x 1   Entered and Authorized by:   Lemont Fillers FNP   Signed by:   Lemont Fillers FNP on 02/01/2010   Method used:   [facility] to        [facility] [phone]* (retail)       [address]       [place], [place]  [address]       Ph: [phone]       Fax: [phone]   RxID:   [id number]    Preventive Care Screening  Pap Smear:    Date:  03/23/2009    Results:  normal   Mammogram:    Date:  02/19/2009    Results:  normal    Current Allergies (reviewed today): No known allergies

## 2010-12-23 NOTE — Assessment & Plan Note (Signed)
Summary: physical / tf,cma   Vital Signs:  Patient profile:   55 year old female Height:      64 inches Weight:      156.25 pounds BMI:     26.92 Temp:     98.3 degrees F oral Pulse rate:   78 / minute Pulse rhythm:   regular Resp:     16 per minute BP sitting:   118 / 82  (right arm) Cuff size:   regular  Vitals Entered By: Mervin Kung CMA (March 01, 2010 8:15 AM) Is Patient Diabetic? No   Primary Care Provider:  Seymour Bars DO   History of Present Illness: Ms Pancoast is a 55 year old female who presents today for a complete physical.  She has no specific complaints.  Preventative- exercises 3x a week.   Diet is high fat/ does not watch her sodium. Pap is to be done with GYN.  Due for mammogram- [facility]. Last colonoscopy 5 years ago- due for f/u due to finding of polyps (was done at [facility]).  Declines tetanus.  Depression- denies suicide ideation, feels that depression is well controlled.  Sleeping well, rarely needs ambien.    Panic attacks- pt feels that these were situational and feels that this is currently well controlled.    Back pain- generally well controlled.   Preventive Screening-Counseling & Management  Alcohol-Tobacco     Smoking Status: current     Smoking Cessation Counseling: yes     Packs/Day: 0.5  Caffeine-Diet-Exercise     Caffeine use/day: 1 cup coffee daily     Does Patient Exercise: yes     Type of exercise: tennis, cardio     Exercise (avg: min/session): >60     Times/week: <3  Allergies (verified): No Known Drug Allergies  Past History:  Past Medical History: Last updated: 12/09/2008 Current Problems:  SUICIDE RISK (ICD-300.9) DEPRESSIVE DISORDER (ICD-311) Panic attacks Hypertension fibroids colon polyps atypical chest pain neg stress tess 01/2007 seasonal allergies hyperlipidemia  Past Surgical History: Last updated: 06/27/2008 Hysterectomy 1998  Family History: Last updated:  03/01/2010 Family History of Arthritis - mother Family History Hypertension - father only child 2 children-  (one son and one daughter) Armed forces training and education officer and well.   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    Risk Factors: Alcohol Use: 0 (06/27/2008) Caffeine Use: 1 cup coffee daily (03/01/2010) Exercise: yes (03/01/2010)  Risk Factors: Smoking Status: current (03/01/2010) Packs/Day: 0.5 (03/01/2010) Passive Smoke Exposure: yes (06/27/2008)  Family History: Reviewed history from 06/27/2008 and no changes required. Family History of Arthritis - mother Family History Hypertension - father only child 2 children-  (one son and one daughter) Alive and well.   Social History: Reviewed history from 06/27/2008 and no changes required. 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  Caffeine use/day:  1 cup coffee daily  Review of Systems       Constitutional: Denies Fever ENT:  Denies nasal congestion or sore throat. Resp: Denies cough CV:  Denies Chest Pain GI:  Denies nausea or vomitting or diarrhea GU: Denies dysuria Lymphatic: Denies lymphadenopathy Musculoskeletal:  Denies muscle/joint pain Skin:  Denies Rashes Psychiatric: see HPI Neuro: Denies numbness     Physical Exam  General:  Well-developed,well-nourished,in no acute distress; alert,appropriate and cooperative throughout examination Head:  Normocephalic and atraumatic without obvious abnormalities. No apparent alopecia or balding. Eyes:  PERRLA Ears:  External ear exam shows no significant lesions or deformities.  Otoscopic examination reveals clear canals, tympanic membranes are intact bilaterally without bulging, retraction,  inflammation or discharge. Hearing is grossly normal bilaterally. Mouth:  Oral mucosa and oropharynx without lesions or exudates.  Teeth in good repair. Neck:  No deformities, masses, or tenderness noted. Breasts:  No mass, nodules, thickening, tenderness, bulging, retraction, inflamation, nipple discharge or skin changes noted.   Lungs:  Normal respiratory effort, chest expands symmetrically. Lungs are clear to auscultation, no crackles or wheezes. Heart:  Normal rate and regular rhythm. S1 and S2 normal without gallop, murmur, click, rub or other extra sounds. Abdomen:  Bowel sounds positive,abdomen soft and non-tender without masses, organomegaly or hernias noted. Genitalia:  deferred to GYN Msk:  No deformity or scoliosis noted of thoracic or lumbar spine.   Extremities:  No clubbing, cyanosis, edema, or deformity noted with normal full range of motion of all joints.   Neurologic:  alert & oriented X3, cranial nerves II-XII intact, strength normal in all extremities, and gait normal.   Skin:  Intact without suspicious lesions or rashes Cervical Nodes:  No lymphadenopathy noted Axillary Nodes:  No palpable lymphadenopathy Psych:  Cognition and judgment appear intact. Alert and cooperative with normal attention span and concentration. No apparent delusions, illusions, hallucinations   Impression & Recommendations:  Problem # 1:  PREVENTIVE HEALTH CARE (ICD-V70.0) Assessment Comment Only Patient exercises regularly- I encouraged her to continue.  She reports high fat diet, and does not watch sodium.  Encouraged low sodium/low fat diet.  Will order screening mammo- patient instructed to arrange f/u with her GYN for pap.  Will refer for f/u colonoscopy.   Orders: Mammogram (Screening) (Mammo) Gastroenterology Referral (GI)  Problem # 2:  HYPERCHOLESTEROLEMIA (ICD-272.0) Assessment: Deteriorated Never started pravastatin- now agreeable to start.  Plan f/u lft's in 1 month Her updated  medication list for this problem includes:    Pravastatin Sodium 40 Mg Tabs (Pravastatin sodium) ..... One tablet by mouth daily at bedtime  Problem # 3:  HYPERTENSION (ICD-401.9) Assessment: Unchanged BP is stable, continue lisinopril/HCTZ. Her updated medication list for this problem includes:    Lisinopril-hydrochlorothiazide 20-12.5 Mg Tabs (Lisinopril-hydrochlorothiazide) ..... One tablet by mouth daily  BP today: 118/82 Prior BP: 118/80 (02/01/2010)  Labs Reviewed: K+: 4.2 (02/22/2010) Creat: : 0.78 (02/22/2010)   Chol: 214 (02/22/2010)   HDL: 44 (02/22/2010)   LDL: 158 (02/22/2010)   TG: 61 (02/22/2010)  Problem # 4:  DEPRESSIVE DISORDER (ICD-311) Assessment: New Patient reports that her depression and anxiety are well controlled.    Problem # 5:  TOBACCO ABUSE (ICD-305.1) Assessment: Unchanged  Patient counselled on need to quit smoking.  She tells me that she is not mentally ready- but will consider cessation  Orders: Tobacco use cessation intermediate 3-10 minutes ([address])  Complete Medication List: 1)  Ambien 10 Mg Tabs (Zolpidem tartrate) .... Take one tablet every 3rd night as needed only 2)  Lisinopril-hydrochlorothiazide 20-12.5 Mg Tabs (Lisinopril-hydrochlorothiazide) .... One tablet by mouth daily 3)  Bayer Aspirin Ec Low Dose 81 Mg Tbec (Aspirin) .... Take 1 tablet by mouth once a day 4)  Estrace Cream (estradiol)  .... Take 1 tablet three times a week 5)  Pravastatin Sodium 40 Mg Tabs (Pravastatin sodium) .... One tablet by mouth daily at bedtime  Patient  Instructions: 1)  Please return in one month for LFT's (272.0) 2)  Continue to work hard on exercise- try to eat a low sodium/low cholesterol diet. 3)  Follow up in 3 months for an appointment- pls come fasting. 4)  Tobacco is very bad for your health and your loved ones! You Should stop smoking!. 5)  Stop Smoking Tips: Choose a Quit date. Cut down before the Quit date. decide what you will do as a  substitute when you feel the urge to smoke(gum,toothpick,exercise). Prescriptions: PRAVASTATIN SODIUM 40 MG TABS (PRAVASTATIN SODIUM) one tablet by mouth daily at bedtime  #30 x 1   Entered and Authorized by:   Lemont Fillers FNP   Signed by:   Lemont Fillers FNP on 03/01/2010   Method used:   [facility]        [facility] [phone]* (retail)       [address]       [place], [place]  [address]       Ph: [phone]       Fax: [phone]   RxID:   [id number]      Vital Signs:  Patient Profile:   55 year old female Height:     64 inches Weight:      156.25 pounds BMI:     26.92 Temp:     98.3 degrees F oral Pulse rate:   78 / minute Pulse rhythm:   regular Resp:     16 per minute BP sitting:   118 / 82 Cuff size:   regular                   Current Allergies (reviewed today): No known allergies

## 2010-12-23 NOTE — Progress Notes (Signed)
Summary: med request -- lisinopril hctz  Phone Note Refill Request Call back at Home Phone [phone] Message from:  Patient on January 25, 2010 8:35 AM  Refills Requested: Medication #1:  LISINOPRIL-HYDROCHLOROTHIAZIDE 10-12.5 MG TABS 1 tab by mouth daily Pt. needs her BP med. called in as soon as possible, she has been out for a week now. Pt. says if you will just give her enough to last her until her appt. date on 02/01/10.. Call pt. and let her know [phone] [facility]  Next Appointment Scheduled: 02/01/10 Initial call taken by: Michaelle Copas,  January 25, 2010 8:38 AM  Follow-up for Phone Call        OK to give 1 week supply Follow-up by: Lemont Fillers FNP,  January 25, 2010 9:05 AM  Additional Follow-up for Phone Call Additional follow up Details #1::        [facility] [address]  Pt notified. Additional Follow-up by: Mervin Kung CMA,  January 25, 2010 4:32 PM    Prescriptions: LISINOPRIL-HYDROCHLOROTHIAZIDE 10-12.5 MG TABS (LISINOPRIL-HYDROCHLOROTHIAZIDE) 1 tab by mouth daily  #7 x 0   Entered by:   Mervin Kung CMA   Authorized by:   Lemont Fillers FNP   Signed by:   Mervin Kung CMA on 01/25/2010   Method used:   Electronically to        [facility] [phone]* (retail)       [address]       [place], [place]  [address]       Ph: [phone]       Fax: [phone]   RxID:   [id number]

## 2010-12-23 NOTE — Miscellaneous (Signed)
Summary: previsit  Clinical Lists Changes  Medications: Added new medication of MIRALAX   POWD (POLYETHYLENE GLYCOL 3350) As directed - Signed Added new medication of REGLAN 10 MG  TABS (METOCLOPRAMIDE HCL) As directed - Signed Added new medication of DULCOLAX 5 MG  TBEC (BISACODYL) As directed - Signed Rx of MIRALAX   POWD (POLYETHYLENE GLYCOL 3350) As directed;  #255 x 0;  Signed;  Entered by: Clide Cliff RN;  Authorized by: Hart Carwin MD;  Method used: Electronically to [facility] [phone]*, [address], [place], [place], [place]  [address], Ph: [phone], Fax: [phone] Rx of REGLAN 10 MG  TABS (METOCLOPRAMIDE HCL) As directed;  #2 x 0;  Signed;  Entered by: Clide Cliff RN;  Authorized by: Hart Carwin MD;  Method used: Electronically to [facility]  [facility] [phone]*, [address], [place], [place], [place]  [address], Ph: [phone], Fax: [phone] Rx of DULCOLAX 5 MG  TBEC (BISACODYL) As directed;  #4 x 0;  Signed;  Entered by: Clide Cliff RN;  Authorized by: Hart Carwin MD;  Method used: Electronically to [facility]  [facility] [phone]*, [address] [place], [place], [place]  [address], Ph: [phone], Fax: [phone] Observations: Added new observation of ALLERGY REV: Done (03/16/2010 13:46)    Prescriptions: DULCOLAX 5 MG  TBEC (BISACODYL) As directed  #4 x 0   Entered by:   Clide Cliff RN   Authorized by:   Hart Carwin MD   Signed by:   Clide Cliff RN on 03/16/2010   Method used:   Electronically to        [facility] [phone]* (retail)       [address]       [place], [place]  [address]       Ph: [phone]       Fax: [phone]   RxID:   [id number] REGLAN 10 MG  TABS (METOCLOPRAMIDE HCL) As directed  #2 x 0   Entered by:   Clide Cliff RN   Authorized by:   Hart Carwin MD   Signed by:   Clide Cliff RN on 03/16/2010   Method used:   Electronically to        [facility] [phone]* (retail)       [address]       [place], [place]  [address]       Ph: [phone]       Fax: [phone]   RxID:   [id number] MIRALAX   POWD (POLYETHYLENE GLYCOL 3350) As directed  #255 x 0   Entered by:   Clide Cliff RN   Authorized by:   Hart Carwin MD   Signed by:   Clide Cliff RN on 03/16/2010   Method used:   Electronically to        [facility]  [facility] [phone]* (retail)       [address]       [place], [place]  [address]       Ph: [phone]       Fax: [phone]   RxID:   [id number]

## 2010-12-23 NOTE — Assessment & Plan Note (Signed)
Summary: lump under chin /mhf   Vital Signs:  Patient profile:   55 year old female Height:      64 inches Weight:      159 pounds BMI:     27.39 Temp:     98.9 degrees F oral Pulse rate:   84 / minute Pulse rhythm:   regular Resp:     16 per minute BP sitting:   130 / 80  (right arm) Cuff size:   regular  Vitals Entered By: Tiffany Gonzales CMA (March 19, 2010 3:45 PM) CC: room 4  Pt states she has a lump under her right jaw since Wednesday and is sore to touch though she feels the swelling is some better today. Is Patient Diabetic? No   Primary Care Provider:  Seymour Bars DO  CC:  room 4  Pt states she has a lump under her right jaw since Wednesday and is sore to touch though she feels the swelling is some better today.Marland Kitchen  History of Present Illness: Tiffany Gonzales is a 55 year old female who presents with c/o of swelling under right side of her jaw since wednesday night.  Denies fever, sore throat or ear pain.  Has not tried any OTC meds.   Allergies (verified): No Known Drug Allergies  Physical Exam  General:  Well-developed,well-nourished,in no acute distress; alert,appropriate and cooperative throughout examination Head:  Normocephalic and atraumatic without obvious abnormalities. No apparent alopecia or balding. Ears:  External ear exam shows no significant lesions or deformities.  Otoscopic examination reveals clear canals, tympanic membranes are intact bilaterally without bulging, retraction, inflammation or discharge. Hearing is grossly normal bilaterally. Mouth:  Oral mucosa and oropharynx without lesions or exudates.  Teeth in good repair. Neck:  + tender swelling under right side of chin Lungs:  Normal respiratory effort, chest expands symmetrically. Lungs are clear to auscultation, no crackles or wheezes. Heart:  Normal rate and regular rhythm. S1 and S2 normal without gallop, murmur, click, rub or other extra sounds.   Impression & Recommendations:  Problem # 1:   PAROTITIS, RIGHT (ICD-527.2) Assessment New Will treat with Augmentin, plan f/u in 1 week.    Complete Medication List: 1)  Ambien 10 Mg Tabs (Zolpidem tartrate) .... Take one tablet every 3rd night as needed only 2)  Lisinopril-hydrochlorothiazide 20-12.5 Mg Tabs (Lisinopril-hydrochlorothiazide) .... One tablet by mouth daily 3)  Bayer Aspirin Ec Low Dose 81 Mg Tbec (Aspirin) .... Take 1 tablet by mouth once a day 4)  Estrace Cream (estradiol)  .... Take 1 tablet three times a week 5)  Pravastatin Sodium 40 Mg Tabs (Pravastatin sodium) .... One tablet by mouth daily at bedtime 6)  Augmentin 500-125 Mg Tabs (Amoxicillin-pot clavulanate) .... One tab by mouth two times a day x 7 day  Patient Instructions: 1)  Please follow up in 1 week, sooner if symptoms worsen or do not improve. Prescriptions: AUGMENTIN 500-125 MG TABS (AMOXICILLIN-POT CLAVULANATE) one tab by mouth two times a day x 7 day  #14 x 0   Entered and Authorized by:   Lemont Fillers FNP   Signed by:   Lemont Fillers FNP on 03/19/2010   Method used:   Electronically to        [facility] [phone]* (retail)       [address]       [place], [place]  [address]       Ph: [phone]  Fax: [phone]   RxID:   [id number]   Current Allergies (reviewed today): No known allergies

## 2010-12-23 NOTE — Letter (Signed)
   [place] [facility] [address] [place], [place]  [address]  [place] Phone: [phone]      October 04, 2010   [facility] [address] [place], [place] [address]  RE:  LAB RESULTS  Dear  Ms. Baquera,  The following is an interpretation of your most recent lab tests.  Please take note of any instructions provided or changes to medications that have resulted from your lab work.  ELECTROLYTES:  Good - no changes needed  KIDNEY FUNCTION TESTS:  Good - no changes needed     Sincerely Yours,    Lemont Fillers FNP  Appended Document:  mailed

## 2010-12-23 NOTE — Progress Notes (Signed)
Summary: BLOOD WORK RESULTS   Phone Note Call from Patient Call back at Home Phone [phone]   Caller: PATIENT  Call For: OSULLIVAN Summary of Call: WOULD LIKE REULTS OF BLOOD WORK  Initial call taken by: Roselle Locus,  February 09, 2010 2:26 PM  Follow-up for Phone Call        Spoke to pt. @ 4pm and notified her of normal Bmet on 02/01/10. Reminded pt. of lab appt. on 02/22/10 and advised her to call at anytime about labs if she hasn't received results. Follow-up by: Mervin Kung CMA,  February 09, 2010 3:59 PM

## 2010-12-23 NOTE — Letter (Signed)
Summary: Previsit letter  [facility]  [address] [place], [place] [address]   Phone: [phone]  Fax: [phone]       03/02/2010 MRN: [id number]  [facility] [address] [place], [place]  [address]  Dear Ms. Sutcliffe,  Welcome to the Gastroenterology Division at [facility].    You are scheduled to see a nurse for your pre-procedure visit on 03-16-10 at [facility] on the 3rd floor at [facility], [address]  We ask that you try to arrive at our office 15 minutes prior to your appointment time to allow for check-in.  Your nurse visit will consist of discussing your medical and surgical history, your immediate family medical history, and your medications.    Please bring a complete list of all your medications or, if you prefer, bring the medication bottles and we will list them.  We will need to be aware of both prescribed and over the counter drugs.  We will need to know exact dosage information as well.  If you are on blood thinners (Coumadin, Plavix, Aggrenox, Ticlid, etc.) please call our office today/prior to your appointment, as we need to consult with your physician about holding your medication.   Please be prepared to read and sign documents such as consent forms, a financial agreement, and acknowledgement forms.  If necessary, and with your consent, a friend or relative is welcome to sit-in on the nurse visit with you.  Please bring your insurance card so that we may make a copy of it.  If your insurance requires a referral to see a specialist, please bring your referral form from your primary care physician.  No co-pay is required for this nurse visit.     If you cannot keep your appointment, please call [phone] to cancel or reschedule prior to your appointment date.  This allows [place] the opportunity to schedule an appointment for another patient in need of care.    Thank you for choosing [facility] for your medical needs.  We  appreciate the opportunity to care for you.  Please visit [place] at our website  to learn more about our practice.                     Sincerely.                                                                                                                   The Gastroenterology Division

## 2010-12-23 NOTE — Procedures (Signed)
Summary: Colonoscopy  Patient: [name] Note: All result statuses are Final unless otherwise noted.  Tests: (1) Colonoscopy (COL)   COL Colonoscopy           DONE     [facility]     [address]     [place], [place]  [address]           COLONOSCOPY PROCEDURE REPORT           PATIENT:  Tiffany Gonzales, Tiffany Gonzales  MR#:  [id number]     BIRTHDATE:  1956/04/17, 53 yrs. old  GENDER:  female     ENDOSCOPIST:  Hedwig Morton. Juanda Chance, MD     REF. BY:  Thomos Lemons, DO     PROCEDURE DATE:  03/30/2010     PROCEDURE:  Colonoscopy [address]     ASA CLASS:  Class I     INDICATIONS:  Elevated Risk Screening prior colon elsewhere, told     to have had 4 polyps removed     MEDICATIONS:   Versed 7 mg, Fentanyl 75 mcg           DESCRIPTION OF PROCEDURE:   After the risks benefits and     alternatives of the procedure were thoroughly explained, informed     consent was obtained.  Digital rectal exam was performed and     revealed decreased sphincter tone.   The LB [id number]     endoscope was introduced through the anus and advanced to the     cecum, which was identified by both the appendix and ileocecal     valve, without limitations.  The quality of the prep was good,     using MiraLax.  The instrument was then slowly withdrawn as the     colon was fully examined.     <<PROCEDUREIMAGES>>           FINDINGS:  There were multiple polyps identified and removed. in     the sigmoid colon. at 20 cm 5-6 diminutive polyps removed 1-2 mm     size The polyps were removed using cold biopsy forceps (see image5     and image4).  This was otherwise a normal examination of the colon     (see image6, image3, and image1).   Retroflexed views in the     rectum revealed no abnormalities.    The scope was then withdrawn     from the patient and the procedure completed.           COMPLICATIONS:  None     ENDOSCOPIC IMPRESSION:     1) Polyps, multiple in the sigmoid colon     2) Otherwise normal  examination     fatty ileocecal valve     RECOMMENDATIONS:     1) Await pathology results     2) High fiber diet.     REPEAT EXAM:  In 7 year(s) for.           ______________________________     Hedwig Morton. Juanda Chance, MD           CC:           n.     eSIGNED:   Hedwig Morton. Shereece Wellborn at 03/30/2010 10:45 AM           Blain Pais, [phone]  Note: An exclamation mark (!) indicates a result that was not dispersed into the flowsheet. Document Creation Date: 03/30/2010 10:46 AM _______________________________________________________________________  (1) Order result status: Final Collection  or observation date-time: 03/30/2010 10:38 Requested date-time:  Receipt date-time:  Reported date-time:  Referring Physician:   Ordering Physician: Lina Sar [phone]) Specimen Source:  Source: Launa Grill Order Number: [id number] Lab site:   Appended Document: Colonoscopy     Procedures Next Due Date:    Colonoscopy: 03/2017

## 2010-12-28 ENCOUNTER — Telehealth: Payer: Self-pay | Admitting: Family

## 2010-12-29 ENCOUNTER — Encounter: Payer: Self-pay | Admitting: Family

## 2010-12-29 ENCOUNTER — Ambulatory Visit (INDEPENDENT_AMBULATORY_CARE_PROVIDER_SITE_OTHER): Payer: Private Health Insurance - Indemnity | Admitting: Family

## 2010-12-29 DIAGNOSIS — I10 Essential (primary) hypertension: Secondary | ICD-10-CM

## 2010-12-29 DIAGNOSIS — G4700 Insomnia, unspecified: Secondary | ICD-10-CM

## 2010-12-29 DIAGNOSIS — E7800 Pure hypercholesterolemia, unspecified: Secondary | ICD-10-CM

## 2011-01-06 NOTE — Progress Notes (Signed)
Summary: regarding lisinopril refill  Phone Note Call from Patient   Caller: Patient Call For: trish Reason for Call: Refill Medication Summary of Call: we told pt that she needs to contact provider regarding lisinopril refill. please call pt at [phone].  Initial call taken by: Elba Barman,  December 28, 2010 8:55 AM  Follow-up for Phone Call        Pt notified of need for f/u. Appt scheduled with Batul Diego for 12/29/10 @ 2:15pm. Pt aware med may be refilled at visit.  Follow-up by: Mervin Kung CMA Duncan Dull),  December 28, 2010 12:02 PM

## 2011-01-06 NOTE — Assessment & Plan Note (Signed)
Summary: f/u htn--rm 5   Vital Signs:  Patient profile:   55 year old female Height:      64 inches Weight:      154.50 pounds BMI:     26.62 Temp:     98.1 degrees F oral Pulse rate:   72 / minute Pulse rhythm:   regular Resp:     16 per minute BP sitting:   140 / 90  (right arm) Cuff size:   regular  Vitals Entered By: Mervin Kung CMA Duncan Dull) (December 29, 2010 2:18 PM) CC: Pt here for follow up of hypertension., Hypertension Management Is Patient Diabetic? No Pain Assessment Patient in pain? no      Comments Pt needs refill on Ambien. Only takes Pravastatin every other day due to diarrhea if she takes it daily. Nicki Guadalajara Fergerson CMA Duncan Dull)  December 29, 2010 2:23 PM    Primary Care Provider:  Lemont Fillers FNP  CC:  Pt here for follow up of hypertension. and Hypertension Management.  History of Present Illness: Tiffany Gonzales is a 55 year old female who presents today for routine follow up.  1)Hyperlipidemia-  reports that she is only able to tolerate pravastatin every other day due to side effect of diarrhea.  2)  Insomnia-  reports that she lost rx for ambien.  Has been sleeping well.  Requests refill "to have on hand."  Hypertension History:      She denies headache, chest pain, palpitations, dyspnea with exertion, peripheral edema, visual symptoms, and syncope.  She notes no problems with any antihypertensive medication side effects.        Positive major cardiovascular risk factors include female age 72 years old or older, hyperlipidemia, hypertension, and current tobacco user.  Negative major cardiovascular risk factors include negative family history for ischemic heart disease.     Allergies (verified): No Known Drug Allergies  Past History:  Past Medical History: Last updated: 12/09/2008 Current Problems:  SUICIDE RISK (ICD-300.9) DEPRESSIVE DISORDER (ICD-311) Panic attacks Hypertension fibroids colon polyps atypical chest pain neg stress tess  01/2007 seasonal allergies hyperlipidemia  Past Surgical History: Last updated: 06/27/2008 Hysterectomy 1998  Review of Systems       see HPI  Physical Exam  General:  Well-developed,well-nourished,in no acute distress; alert,appropriate and cooperative throughout examination Lungs:  Normal respiratory effort, chest expands symmetrically. Lungs are clear to auscultation, no crackles or wheezes. Heart:  Normal rate and regular rhythm. S1 and S2 normal without gallop, murmur, click, rub or other extra sounds.   Impression & Recommendations:  Problem # 1:  HYPERCHOLESTEROLEMIA (ICD-272.0) Assessment Comment Only Will try switching from pravachol to simvastatin to see if this improves her diarrhea. Her updated medication list for this problem includes:    Simvastatin 20 Mg Tabs (Simvastatin) ..... One tablet by mouth once daily at bedtime  Problem # 2:  INSOMNIA (ICD-780.52) Assessment: Improved Use ambien as needed only Her updated medication list for this problem includes:    Ambien 10 Mg Tabs (Zolpidem tartrate) .Marland Kitchen... Take one tablet every 3rd night as needed only  Problem # 3:  HYPERTENSION (ICD-401.9) Assessment: Unchanged Stable, continue lisinopril.  BMET next visit. Her updated medication list for this problem includes:    Lisinopril 20 Mg Tabs (Lisinopril) ..... One tablet by mouth once daily  BP today: 140/90 Prior BP: 124/90 (10/01/2010)  10 Yr Risk Heart Disease: 17 % Prior 10 Yr Risk Heart Disease: 8 % (08/30/2010)  Labs Reviewed: K+: 3.7 (  10/01/2010) Creat: : 0.78 (10/01/2010)   Chol: 172 (06/17/2010)   HDL: 49 (06/17/2010)   LDL: 115 (06/17/2010)   TG: 38 (06/17/2010)  Complete Medication List: 1)  Ambien 10 Mg Tabs (Zolpidem tartrate) .... Take one tablet every 3rd night as needed only 2)  Lisinopril 20 Mg Tabs (Lisinopril) .... One tablet by mouth once daily 3)  Bayer Aspirin Ec Low Dose 81 Mg Tbec (Aspirin) .... Take 1 tablet by mouth once a day 4)   Estrace Cream (estradiol)  .... Take 1 tablet three times a week 5)  Simvastatin 20 Mg Tabs (Simvastatin) .... One tablet by mouth once daily at bedtime  Hypertension Assessment/Plan:      The patient's hypertensive risk group is category B: At least one risk factor (excluding diabetes) with no target organ damage.  Her calculated 10 year risk of coronary heart disease is 17 %.  Today's blood pressure is 140/90.  Her blood pressure goal is < 140/90.  Patient Instructions: 1)  Please follow up in 3 months, come fasting to this appointment.  Prescriptions: LISINOPRIL 20 MG TABS (LISINOPRIL) one tablet by mouth once daily  #30 x 3   Entered and Authorized by:   Lemont Fillers FNP   Signed by:   Lemont Fillers FNP on 12/29/2010   Method used:   Electronically to        [facility] [phone]* (retail)       [address]       [place], [place]  [address]       Ph: [phone]       Fax: [phone]   RxID:   [id number] LISINOPRIL 20 MG TABS (LISINOPRIL) one tablet by mouth once daily  #30 x 3   Entered and Authorized by:   Lemont Fillers FNP   Signed by:   Lemont Fillers FNP on 12/29/2010   Method used:   Print then Give to Patient   RxID:   [id number] AMBIEN 10 MG  TABS (ZOLPIDEM TARTRATE) Take one tablet every 3rd night as needed only  #30 x 0   Entered and Authorized by:   Lemont Fillers FNP   Signed by:   Lemont Fillers FNP on 12/29/2010   Method used:   Print then Give to Patient   RxID:   [id number] SIMVASTATIN 20 MG TABS (SIMVASTATIN) one tablet by mouth once daily at bedtime  #30 x 3   Entered and Authorized by:   Lemont Fillers FNP   Signed by:   Lemont Fillers FNP on 12/29/2010   Method used:   Electronically to        [facility] [phone]* (retail)       [address]       [place], [place]  [address]       Ph: [phone]       Fax: [phone]    RxID:   [id number]    Orders Added: 1)  Est. Patient Level III [15176]    Current Allergies (reviewed today): No known allergies

## 2011-02-23 ENCOUNTER — Encounter: Payer: Self-pay | Admitting: Family

## 2011-02-23 ENCOUNTER — Ambulatory Visit (INDEPENDENT_AMBULATORY_CARE_PROVIDER_SITE_OTHER): Payer: Private Health Insurance - Indemnity | Admitting: Family

## 2011-02-23 DIAGNOSIS — E785 Hyperlipidemia, unspecified: Secondary | ICD-10-CM

## 2011-02-23 DIAGNOSIS — B029 Zoster without complications: Secondary | ICD-10-CM | POA: Insufficient documentation

## 2011-02-23 DIAGNOSIS — I10 Essential (primary) hypertension: Secondary | ICD-10-CM

## 2011-02-23 DIAGNOSIS — R404 Transient alteration of awareness: Secondary | ICD-10-CM

## 2011-02-23 DIAGNOSIS — R400 Somnolence: Secondary | ICD-10-CM | POA: Insufficient documentation

## 2011-02-23 LAB — HEPATIC FUNCTION PANEL
Bilirubin, Direct: 0.1 mg/dL (ref 0.0–0.3)
Indirect Bilirubin: 0.2 mg/dL (ref 0.0–0.9)

## 2011-02-23 LAB — BASIC METABOLIC PANEL
BUN: 10 mg/dL (ref 6–23)
Chloride: 105 mEq/L (ref 96–112)
Glucose, Bld: 92 mg/dL (ref 70–99)
Potassium: 4 mEq/L (ref 3.5–5.3)

## 2011-02-23 MED ORDER — LISINOPRIL 20 MG PO TABS: ORAL_TABLET | ORAL | Status: DC

## 2011-02-23 MED ORDER — VALACYCLOVIR HCL 1 G PO TABS: 1000.0000 mg | ORAL_TABLET | Freq: Three times a day (TID) | ORAL | Status: AC

## 2011-02-23 NOTE — Progress Notes (Signed)
  Subjective:    Patient ID: Tiffany Gonzales, female    DOB: 1956/03/09, 55 y.o.   MRN: [id number]  HPI  Tiffany Gonzales is a 55 yr old female presents today for follow up and to discuss recent dizziness and rash on chest.    1) Dizziness- started yesterday, before dinner. Felt wobbly, went to bed, resolved on its own.   2)Hyperlidemia- tolerating simvastatin- diarrhea is resolved.   3)insomnia- + fatigue, not sleeping well, rarely using the ambien. Wakes up 3x a night to go to the bathroom. Goes to bed at 9:30PM gets up between 6 and 6:30.  + Snoring- badly.    4) rash -tarted 2 days ago, itching/painful. Neosporin helped with the itching. Notes that originally there were blisters which weeped.   Review of Systems  Constitutional: Negative for fever and chills.       Objective:   Physical Exam  Constitutional: She appears well-developed and well-nourished.  HENT:  Head: Normocephalic and atraumatic.  Eyes: Conjunctivae are normal. Pupils are equal, round, and reactive to light.  Cardiovascular: Normal rate and regular rhythm.   Pulmonary/Chest: Effort normal and breath sounds normal.  Skin:          Rash noted beneath right breast, some ulcerated areas from previous vesicles.          Assessment & Plan:

## 2011-02-23 NOTE — Assessment & Plan Note (Signed)
Will treat with valtrex 

## 2011-02-23 NOTE — Patient Instructions (Signed)
You will be contacted about your referral for your sleep study. Please complete your lab work on the first floor.

## 2011-02-23 NOTE — Assessment & Plan Note (Signed)
Will cut back on her lisinopril to 10mg  once daily.

## 2011-02-23 NOTE — Assessment & Plan Note (Signed)
+   daytime somnolence despite >8 hrs of sleep at night.  + snoring.  Will refer for sleep study.

## 2011-02-27 ENCOUNTER — Encounter: Payer: Self-pay | Admitting: Family

## 2011-02-28 ENCOUNTER — Telehealth: Payer: Self-pay | Admitting: *Deleted

## 2011-02-28 NOTE — Telephone Encounter (Signed)
Pt.notified

## 2011-02-28 NOTE — Telephone Encounter (Signed)
Pt called wanting to know her lab results. Please advise. °

## 2011-02-28 NOTE — Telephone Encounter (Signed)
Please call patient and let her know that her labs are normal (Basic metabolic panel, TSH and liver tests)

## 2011-04-08 ENCOUNTER — Encounter (HOSPITAL_BASED_OUTPATIENT_CLINIC_OR_DEPARTMENT_OTHER): Payer: Private Health Insurance - Indemnity

## 2011-04-08 NOTE — Assessment & Plan Note (Signed)
[facility]                            CARDIOLOGY OFFICE NOTE   NAME:Tiffany Gonzales, Tiffany Gonzales                    MRN:          [id number]  DATE:01/25/2007                            DOB:          03/17/1956    Ms. [name] is a pleasant 55 year old patient referred by Dr. Blossom Hoops.  She is having chest pressure.  Her pressure has been going on for about  2 weeks.  It is atypical for coronary pain.  It is primarily right-  sided.  There is no exacerbating factor.  She does not know what she can  do to make it better.  It tends to be short-lived, it has been  intermittent over the last 2 weeks.  There is no associated nausea or GI  complaints, there is no associated shortness of breath.   She did have the flu a couple of weeks ago and it may be some residual  myalgias.   She has not had any previous cardiac problems before.   There is no family history of coronary disease, no diabetes, no  hypertension, no smoking.  Her lipid status is unknown to me.   REVIEW OF SYSTEMS:  Remarkable for occasional headaches.   SHE IS NOT ALLERGIC TO SHELLFISH.  SHE DOES NOT HAVE ANY OTHER KNOWN  ALLERGIES.   She has been smoking 6 cigarettes a day.  She smoked for 20 years.   She has occasional alcohol and 1 cup of coffee a day.   Patient uses an elliptical machine and weights on occasion.   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   She is divorced.  Her only previous surgical history is a hysterectomy  in 1998.   Mother and father are both still alive.   MEDICATIONS:  1. Singulair 10 a day.  2. Lisinopril 10/12.5.  3. Wellbutrin 300 a day.  4. __________ 10 mg a day.   EXAM:  Remarkable for a blood pressure of 126/76, pulse is 76 and  regular.  HEENT:  Normal.  Carotids are normal without bruit.  There is no  lymphadenopathy, no thyromegaly.  LUNGS:  Clear.  There is an S1, S2 with normal heart sounds.  ABDOMEN:  Benign.  LOWER  EXTREMITIES:  Intact pulses, no edema.  She has an owl tattoo on  the back between her shoulder blades.  NEURO:  Nonfocal.  SKIN:  Warm and dry.   EKG is normal.   IMPRESSION:  1. Atypical chest pain in a patient who smokes and has hypertension.      Unfortunately, her EKG has some T wave flattening in 3 and F and I      think she would need a Myoview study, not just a simple treadmill.      We will try to schedule this next Monday or Friday at her      convenience.  The pain is atypical.  As long as her stress test is      normal she does not need cardiovascular followup.  She will      continue to follow up with  Dr. Blossom Hoops for risk factor      modification.  Her blood pressure seems well controlled on      lisinopril/HCTZ.  2. She is at a point where she should be able to stop smoking on      Wellbutrin.  3. There is a question of a history of hyperlipidemia, and again she      will follow up with Dr. Blossom Hoops for this without any known      coronary artery disease and LDL cholesterol under 100 should      suffice.     [name]. [name], MD, [facility]  Electronically Signed    PCN/MedQ  DD: 01/25/2007  DT: 01/25/2007  Job #: [id number]   cc:   Leanne Chang, M.D.

## 2011-04-08 NOTE — Op Note (Signed)
   NAME:  Tiffany Gonzales, Tiffany Gonzales                     ACCOUNT NO.:  [id number]   MEDICAL RECORD NO.:  [id number]                   PATIENT TYPE:  AMB   LOCATION:  DSC                                  FACILITY:  [facility]   PHYSICIAN:  Loreta Ave, M.D.              DATE OF BIRTH:  [birth year]   DATE OF PROCEDURE:  04/24/2003  DATE OF DISCHARGE:                                 OPERATIVE REPORT   PREOPERATIVE DIAGNOSIS:  Right carpal tunnel syndrome.   POSTOPERATIVE DIAGNOSIS:  Right carpal tunnel syndrome.   PROCEDURE:  Right carpal tunnel release.   SURGEON:  Loreta Ave, M.D.   ASSISTANT:  Arlys John D. Petrarca, P.A.-C.   ANESTHESIA:  General.   ESTIMATED BLOOD LOSS:  Minimal.   TOURNIQUET TIME:  30 minutes.   SPECIMENS:  None.  Cultures; none.   COMPLICATIONS:  None.  Dressing was soft compressive with a bulky hand  dressing and splint.   DESCRIPTION OF PROCEDURE:  The patient was brought to the operating room and  after adequate anesthesia had been obtained, the tourniquet was applied to  the upper aspect of the right arm.  Prepped and draped in the usual sterile  fashion.  Exsanguinated with elevation of Esmarch. The tourniquet inflated  to 250 mmHg.  An incision on the volar aspect of the hand over the carpal  tunnel.  Heading slightly ulnarward to the distal wrist crease. Avoiding  injury to the palmar branch of the median nerve.  The skin and subcutaneous  tissue was divided.  Retinaculum over the carpal tunnel exposed and incised  under direct visualization from the forearm fascia proximally to palmar arch  distally.  Moderate to marked thickening of all the tissues throughout the  carpal tunnel.  Very thickened, tight epineurium with marked hourglass  constriction of nerve.  After carpal tunnel release, epineurotomy and  debridement of the thickened tenosynovial myxedematous tissue, I had a nice  decompression of the nerve throughout.  Digital branches and motor  branches  were identified, protected, and decompressed.  Obvious improvement on the  amount of constriction on the nerve at completion.  The wound was irrigated.  Skin closed with nylon.  Margins of the wound injected with Marcaine.  A  sterile compressive dressing with hand dressing applied.  The tourniquet was  deflated.  Anesthesia reversed.  Brought to the recovery room.  Tolerated  the surgery well with no complications.                                               Loreta Ave, M.D.    DFM/MEDQ  D:  04/24/2003  T:  04/24/2003  Job:  [id number]

## 2011-04-08 NOTE — Op Note (Signed)
NAMEJALEIA, HANKE           ACCOUNT NO.:  [id number]   MEDICAL RECORD NO.:  [id number]          PATIENT TYPE:  AMB   LOCATION:  ENDO                         FACILITY:  [facility]   PHYSICIAN:  Petra Kuba, M.D.    DATE OF BIRTH:  1956-08-22   DATE OF PROCEDURE:  02/22/2005  DATE OF DISCHARGE:                                 OPERATIVE REPORT   PROCEDURE:  Colonoscopy with polypectomy.   INDICATIONS:  Guaiac positivity.  Due for colonic screening.  Abdominal  pain.  Anorectal complaints.  Consent was signed after risks, benefits,  methods, options were thoroughly discussed in the office.   MEDICINES USED:  1.  Demerol 60.  2.  Versed 5.   PROCEDURE:  Rectal inspection was pertinent for external hemorrhoids, small.  Digital exam was negative.  Video pediatric adjustable colonoscope was  inserted, easily advanced around the colon to the cecum.  This did require  abdominal pressure but no position changes.  No significant abnormality was  seen on insertion.  The cecum was identified by the appendiceal orifice and  the ileocecal valve.  In fact, the scope was inserted a short ways into the  terminal ileum, which was normal.  Photodocumentation was obtained.  The  scope was slowly withdrawn.  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  The scope was slowly withdrawn.  The prep was adequate.  There was some liquid stool that required washing  and suctioning.  On slow withdraw through the colon, four tiny polyps, two  in the ascending, one in the transverse, and one in the descending were seen  and were all hot biopsied x 1 and put in the same container.  The scope was  slowly withdrawn.  No other abnormalities were seen as we slowly withdrew  back to the rectum.  Anorectal pullthrough and retroflexion confirmed some  small hemorrhoids.  The scope was straightened and readvanced a short ways  up the left side of the colon, air was  suctioned, scope removed.  The  patient tolerated the procedure well.  There was no obvious immediate  complication.   ENDOSCOPIC DIAGNOSES:  1.  Internal and external hemorrhoids.  2.  Four tiny polyps in the descending, transverse, and two in the      ascending, hot biopsied.  3.  Lipomatous ileocecal valve, biopsied.  4.  Otherwise within normal limits to the cecum and terminal ileum.Marland Kitchen   PLAN:  Await pathology.  Probably recheck colon screening in five years.  Continue workup with an EGD.      MEM/MEDQ  D:  02/22/2005  T:  02/22/2005  Job:  [id number]   cc:   Petra Kuba, M.D.  [address], [address]  [place]  [place] [address]  Fax: [phone]   Leanne Chang, M.D.  [address]  [place]  [place] [address]  Fax: [phone]

## 2011-04-08 NOTE — Op Note (Signed)
Tiffany Gonzales, FAETH           ACCOUNT NO.:  [id number]   MEDICAL RECORD NO.:  [id number]          PATIENT TYPE:  AMB   LOCATION:  ENDO                         FACILITY:  [facility]   PHYSICIAN:  Petra Kuba, M.D.    DATE OF BIRTH:  [birth year]   DATE OF PROCEDURE:  02/22/2005  DATE OF DISCHARGE:                                 OPERATIVE REPORT   PROCEDURE:  EGD with biopsy.   INDICATIONS:  Guaiac positivity, nondiagnostic colonoscopy, abdominal pain.   Consent was signed after risks, benefits, methods and options were  thoroughly discussed in the office.   ADDITIONAL MEDICINES FOR THIS PROCEDURE:  1 of Versed only.   DESCRIPTION OF PROCEDURE:  The scope was inserted by direct vision. The  proximal and mid esophagus was normal and distal esophagus was a tiny  erosion with no obvious other reflux change or hiatal hernia. The scope  passed into the stomach and advanced to the antrum where some mild antritis  was seen as well as a small antral nodule along the pylorus which was  biopsied at the end of the procedure. The scope passed through a normal  pylorus into a normal duodenal bulb and around the C-loop to a normal second  portion of the duodenum. The scope was withdrawn back to the bulb and a good  look there ruled out abnormalities in the location. The scope was withdrawn  back to the antrum which confirmed the antritis and retroflexed. The  angularis, cardia, fundus, lesser and greater curve were normal on  retroflexed visualization. Straight visualization of the stomach did not  reveal any additional finding. We then re-advanced to the antrum, the  biopsies of the tiny to small nodule as well as some of the antritis were  obtained and we did take two of the proximal stomach to rule out  Helicobacter as well. All were put in the same container. Air was suctioned,  scope slowly withdrawn back to the distal esophagus and a few biopsies of  the distal esophagus and the tiny  erosion were obtained and put in a second  container. The scope was further withdrawn. No additional abnormalities were  seen. The scope was removed. The patient tolerated the procedure well. There  was no obvious immediate complications.   ENDOSCOPIC DIAGNOSIS:  1.  Tiny distal esophageal erosions status post biopsy.  2.  Minimal antritis and minimal nodule in the antrum status post biopsy.  3.  Otherwise normal esophagogastroduodenoscopy.   PLAN:  Await pathology, over-the-counter Prilosec p.r.n., care with aspirin  and nonsteroidals since they may have been playing a role with her stomach  pain. Follow up with me p.r.n. or in six weeks to recheck symptoms, probably  guaiacs and decide any other workup and plans.      MEM/MEDQ  D:  02/22/2005  T:  02/22/2005  Job:  [id number]   cc:   Leanne Chang, M.D.  [address]  [place]  [place] [address]  Fax: [phone]

## 2011-04-13 ENCOUNTER — Other Ambulatory Visit: Payer: Self-pay | Admitting: Neurological Surgery

## 2011-04-13 DIAGNOSIS — M542 Cervicalgia: Secondary | ICD-10-CM

## 2011-04-19 ENCOUNTER — Ambulatory Visit
Admission: RE | Admit: 2011-04-19 | Discharge: 2011-04-19 | Disposition: A | Discharge status: Home / Self Care | Payer: Private Health Insurance - Indemnity | Source: Ambulatory Visit | Attending: Neurological Surgery | Admitting: Neurological Surgery

## 2011-04-19 DIAGNOSIS — M542 Cervicalgia: Secondary | ICD-10-CM

## 2011-05-17 ENCOUNTER — Other Ambulatory Visit: Payer: Self-pay | Admitting: Family

## 2011-05-17 DIAGNOSIS — Z1231 Encounter for screening mammogram for malignant neoplasm of breast: Secondary | ICD-10-CM

## 2011-05-24 ENCOUNTER — Other Ambulatory Visit: Payer: Self-pay | Admitting: Family

## 2011-05-24 NOTE — Telephone Encounter (Signed)
30 day supply sent to pharmacy for simvastatin with note for pt to call for appointment. Last lipid check was 05/2010. Per 4/12 office visit, pt was due back for 1 week follow up of BP and did not return.

## 2011-06-01 ENCOUNTER — Ambulatory Visit
Admission: RE | Admit: 2011-06-01 | Discharge: 2011-06-01 | Disposition: A | Discharge status: Home / Self Care | Payer: Private Health Insurance - Indemnity | Source: Ambulatory Visit | Attending: Family | Admitting: Family

## 2011-06-01 DIAGNOSIS — Z1231 Encounter for screening mammogram for malignant neoplasm of breast: Secondary | ICD-10-CM

## 2011-06-22 ENCOUNTER — Telehealth: Payer: Self-pay | Admitting: Family

## 2011-06-22 ENCOUNTER — Other Ambulatory Visit: Payer: Self-pay | Admitting: Family

## 2011-06-22 DIAGNOSIS — I10 Essential (primary) hypertension: Secondary | ICD-10-CM

## 2011-06-22 MED ORDER — LISINOPRIL 20 MG PO TABS: ORAL_TABLET | ORAL | Status: DC

## 2011-06-22 NOTE — Telephone Encounter (Signed)
Patient states that she took last lisinopril this morning. She made an appt for 06-28-11. Patient wants to know if we can call in some more to last her until her appt. [facility] [address]

## 2011-06-22 NOTE — Telephone Encounter (Signed)
Refill sent to pharmacy and pt notified.  

## 2011-06-23 NOTE — Telephone Encounter (Signed)
Called pharmacy to clarify what medication needed refilling as it did not come through e-scribe. Per Gearldine Bienenstock, there are no pending refills for pt in their system. Request discarded.

## 2011-06-24 ENCOUNTER — Encounter: Payer: Self-pay | Admitting: Family

## 2011-06-28 ENCOUNTER — Encounter: Payer: Self-pay | Admitting: Family

## 2011-06-28 ENCOUNTER — Ambulatory Visit (INDEPENDENT_AMBULATORY_CARE_PROVIDER_SITE_OTHER): Payer: Private Health Insurance - Indemnity | Admitting: Family

## 2011-06-28 DIAGNOSIS — E7800 Pure hypercholesterolemia, unspecified: Secondary | ICD-10-CM

## 2011-06-28 DIAGNOSIS — M5090 Cervical disc disorder, unspecified, unspecified cervical region: Secondary | ICD-10-CM

## 2011-06-28 DIAGNOSIS — E785 Hyperlipidemia, unspecified: Secondary | ICD-10-CM

## 2011-06-28 DIAGNOSIS — I10 Essential (primary) hypertension: Secondary | ICD-10-CM

## 2011-06-28 MED ORDER — SIMVASTATIN 20 MG PO TABS: 20.0000 mg | ORAL_TABLET | Freq: Every day | ORAL | Status: DC

## 2011-06-28 MED ORDER — LISINOPRIL 10 MG PO TABS: 10.0000 mg | ORAL_TABLET | Freq: Every day | ORAL | Status: DC

## 2011-06-28 NOTE — Patient Instructions (Signed)
Please complete your lab work on the first floor.  Follow up in 6 months.

## 2011-06-28 NOTE — Assessment & Plan Note (Signed)
Check FLP today.  Cont. Statin.

## 2011-06-28 NOTE — Assessment & Plan Note (Signed)
This is being managed by neurosurgery.  She is planning for surgery perhaps in the late fall.

## 2011-06-28 NOTE — Progress Notes (Signed)
  Subjective:    Patient ID: Tiffany Gonzales, female    DOB: Apr 05, 1956, 55 y.o.   MRN: [id number]  HPI  C5-6 DDD- seeing Dr. Marikay Alar.  It is recommended that she have a disc fusion but she has delayed this due to her new job.  She has a follow up apt in October.  Having neck pain and numbness down the left arm.  HTN- Continues lisinopril.  Denies chest pain, sob, or lower extremity edema.  Notes occasional HA's.    Hyperlipidemia- denies myalgias. Continues statin.   Review of Systems    see HPI  Past Medical History  Diagnosis Date  . Depression   . Panic attacks   . Hypertension   . Fibroids   . Colon polyps   . Atypical chest pain 01/2007    negative stress test  . Allergy     seasonal  . Hyperlipidemia     History   Social History  . Marital Status: Single    Spouse Name: N/A    Number of Children: N/A  . Years of Education: N/A   Occupational History  . Not on file.   Social History Main Topics  . Smoking status: Current Everyday Smoker -- 0.3 packs/day    Types: Cigarettes  . Smokeless tobacco: Never Used  . Alcohol Use: Yes     Once a week  . Drug Use: No  . Sexually Active: Not on file   Other Topics Concern  . Not on file   Social History Narrative  . No narrative on file    Past Surgical History  Procedure Date  . Abdominal hysterectomy 1998    Family History  Problem Relation Age of Onset  . Arthritis Mother   . Hypertension Father     No Known Allergies  Current Outpatient Prescriptions on File Prior to Visit  Medication Sig Dispense Refill  . aspirin 81 MG tablet Take 81 mg by mouth daily.        Marland Kitchen estradiol (ESTRACE VAGINAL) 0.1 MG/GM vaginal cream Place 1 Applicatorful vaginally. Three times a week.       Marland Kitchen lisinopril (PRINIVIL,ZESTRIL) 20 MG tablet 1/2 tablet by mouth daily  15 tablet  0  . simvastatin (ZOCOR) 20 MG tablet TAKE ONE TABLET BY MOUTH ONCE DAILY AT BEDTIME  30 tablet  0  . zolpidem (AMBIEN) 10 MG tablet Take  10 mg by mouth at bedtime as needed.          BP 130/84  Pulse 78  Temp(Src) 98.4 F (36.9 C) (Oral)  Resp 16  Ht 5\' 3"  (1.6 m)  Wt 152 lb 1.9 oz (69.001 kg)  BMI 26.95 kg/m2    Objective:   Physical Exam  Constitutional: She appears well-developed and well-nourished.  Cardiovascular: Normal rate and regular rhythm.   No murmur heard. Pulmonary/Chest: Effort normal and breath sounds normal. No respiratory distress. She has no wheezes. She has no rales.  Musculoskeletal: She exhibits no tenderness.  Psychiatric: She has a normal mood and affect. Her behavior is normal. Judgment and thought content normal.          Assessment & Plan:

## 2011-06-28 NOTE — Assessment & Plan Note (Signed)
Stable, check BMET, continue ACE.

## 2011-06-29 LAB — BASIC METABOLIC PANEL
BUN: 11 mg/dL (ref 6–23)
Calcium: 9.2 mg/dL (ref 8.4–10.5)
Creat: 0.74 mg/dL (ref 0.50–1.10)
Glucose, Bld: 83 mg/dL (ref 70–99)

## 2011-06-29 LAB — LIPID PANEL: Cholesterol: 151 mg/dL (ref 0–200)

## 2011-06-30 ENCOUNTER — Encounter: Payer: Self-pay | Admitting: Family

## 2011-07-08 ENCOUNTER — Other Ambulatory Visit: Payer: Self-pay | Admitting: Family

## 2011-07-14 ENCOUNTER — Ambulatory Visit (HOSPITAL_COMMUNITY)
Admission: RE | Admit: 2011-07-14 | Payer: Private Health Insurance - Indemnity | Source: Ambulatory Visit | Admitting: Neurological Surgery

## 2011-08-22 ENCOUNTER — Ambulatory Visit (INDEPENDENT_AMBULATORY_CARE_PROVIDER_SITE_OTHER): Payer: Private Health Insurance - Indemnity | Admitting: Family

## 2011-08-22 ENCOUNTER — Encounter: Payer: Self-pay | Admitting: Family

## 2011-08-22 DIAGNOSIS — H109 Unspecified conjunctivitis: Secondary | ICD-10-CM

## 2011-08-22 DIAGNOSIS — J069 Acute upper respiratory infection, unspecified: Secondary | ICD-10-CM

## 2011-08-22 MED ORDER — ESTRADIOL 0.1 MG/GM VA CREA: TOPICAL_CREAM | VAGINAL | Status: DC

## 2011-08-22 MED ORDER — CIPROFLOXACIN HCL 0.3 % OP SOLN: 1.0000 [drp] | OPHTHALMIC | Status: AC

## 2011-08-22 MED ORDER — CIPROFLOXACIN HCL 0.3 % OP SOLN: 1.0000 [drp] | OPHTHALMIC | Status: DC

## 2011-08-22 NOTE — Progress Notes (Signed)
  Subjective:    Patient ID: Tiffany Gonzales, female    DOB: Oct 04, 1956, 55 y.o.   MRN: [id number]  HPI  Ms.  Tiffany Gonzales is a 55 yr old female who presents today with two concerns.  1)R eye redness since Saturday.  Denies drainage or visual changes in the right eye.  2)Sore throat started yesterday AM.  Denies associated fever, nausea, vomitting ear pain or headache.  She does report associated nasal congestion.    Review of Systems    see HPI  Past Medical History  Diagnosis Date  . Depression   . Panic attacks   . Hypertension   . Fibroids   . Colon polyps   . Atypical chest pain 01/2007    negative stress test  . Allergy     seasonal  . Hyperlipidemia     History   Social History  . Marital Status: Single    Spouse Name: N/A    Number of Children: 2  . Years of Education: N/A   Occupational History  . 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 Smoking status: Current Everyday Smoker -- 0.3 packs/day    Types: Cigarettes  . Smokeless tobacco: Never Used  . Alcohol Use: Yes     Once a week  . Drug Use: No  . Sexually Active: Not on file   Other Topics Concern  . 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 Abdominal hysterectomy 1998    Family History  Problem Relation Age of Onset  . Arthritis Mother   . Hypertension Father     No Known Allergies  Current Outpatient Prescriptions on File Prior to Visit  Medication Sig Dispense Refill  . aspirin 81 MG tablet Take 81 mg by mouth daily.        Marland Kitchen lisinopril (PRINIVIL,ZESTRIL) 10 MG tablet Take 1 tablet (10 mg total) by mouth daily.  30 tablet  5  . simvastatin (ZOCOR) 20 MG tablet Take 1 tablet (20 mg total) by mouth at bedtime.  30 tablet  5  . zolpidem (AMBIEN) 10 MG tablet Take 10 mg by mouth at bedtime as needed.          BP 132/86  Pulse 78  Temp(Src)  98.7 F (37.1 C) (Oral)  Resp 16  Ht 5\' 3"  (1.6 m)  Wt 151 lb 1.3 oz (68.529 kg)  BMI 26.76 kg/m2    Objective:   Physical Exam  Constitutional: She appears well-developed and well-nourished.  HENT:  Head: Normocephalic and atraumatic.  Right Ear: Tympanic membrane and ear canal normal.  Left Ear: Tympanic membrane and ear canal normal.  Mouth/Throat: Uvula is midline, oropharynx is clear and moist and mucous membranes are normal.       Mild post oropharyngeal erythema. No exudates.    Eyes: Pupils are equal, round, and reactive to light. Right eye exhibits no discharge. Left eye exhibits no discharge. No scleral icterus.         + scleral injection right eye medially.          Assessment & Plan:

## 2011-08-22 NOTE — Patient Instructions (Signed)
Please call if your symptoms worsen or if you are not feeling better in 2-3 days.  

## 2011-08-23 DIAGNOSIS — H109 Unspecified conjunctivitis: Secondary | ICD-10-CM | POA: Insufficient documentation

## 2011-08-23 DIAGNOSIS — J069 Acute upper respiratory infection, unspecified: Secondary | ICD-10-CM | POA: Insufficient documentation

## 2011-08-23 NOTE — Assessment & Plan Note (Signed)
This too may be viral, but will plan empiric rx with cipro drops.

## 2011-08-23 NOTE — Assessment & Plan Note (Signed)
Suspect viral URI. We discussed conservative measures (motrin, chloraseptic spray, salt water gargles)

## 2011-08-24 ENCOUNTER — Telehealth: Payer: Self-pay | Admitting: Family

## 2011-08-24 DIAGNOSIS — R400 Somnolence: Secondary | ICD-10-CM

## 2011-08-24 NOTE — Telephone Encounter (Signed)
Tiffany Gonzales, pt would like to reschedule her sleep study. This was originally placed in Epic back in April. Would you please call to reschedule? thanks

## 2011-08-29 ENCOUNTER — Telehealth: Payer: Self-pay | Admitting: Family

## 2011-08-29 DIAGNOSIS — R400 Somnolence: Secondary | ICD-10-CM

## 2011-08-29 NOTE — Telephone Encounter (Signed)
Addended by: Sandford Craze on: 08/29/2011 05:04 PM   Modules accepted: Orders

## 2011-09-02 ENCOUNTER — Telehealth: Payer: Self-pay | Admitting: Pulmonary Disease

## 2011-09-02 NOTE — Telephone Encounter (Signed)
LMTCB

## 2011-09-05 ENCOUNTER — Other Ambulatory Visit: Payer: Self-pay | Admitting: Family

## 2011-09-05 NOTE — Telephone Encounter (Signed)
Spoke with Clydie Braun at [facility] and advised her that we have never seen this patient. We are not the ordering physician and did not initiate this referral. Charna Busman at [facility] that she would need to contact the referring physician's office which is Dr. Sandford Craze.

## 2011-09-05 NOTE — Telephone Encounter (Signed)
Per Almyra Free send to [facility] poole

## 2011-09-06 ENCOUNTER — Other Ambulatory Visit: Payer: Self-pay | Admitting: Family

## 2011-09-06 NOTE — Telephone Encounter (Signed)
Refill was sent to pharmacy in August for lisinopril 10mg  1 tablet daily. Advised pharmacist to fill from Rx on file.

## 2011-09-07 NOTE — Telephone Encounter (Signed)
Refill was sent to pharmacy on 06/28/11 for #30 x 5 refills of simvastatin. Should still have refills on file. Denial sent to pharmacy and message left on pharmacy voicemail.

## 2011-09-13 ENCOUNTER — Telehealth: Payer: Self-pay | Admitting: Pulmonary Disease

## 2011-09-13 DIAGNOSIS — R400 Somnolence: Secondary | ICD-10-CM

## 2011-09-14 NOTE — Telephone Encounter (Signed)
Called insurance company to schedule peer to peer as they tell me that the sleep study has been denied.

## 2011-09-14 NOTE — Telephone Encounter (Signed)
Reviewed pt's chart, per phone note from 10/12, we've already spoke with ins company to inform them this pt isn't our pt.  We've never seen before.  This info they are calling about needs to go through pt's pcp, Dr. Peggyann Juba.  Will forward message to Dr. Peggyann Juba.

## 2011-09-14 NOTE — Telephone Encounter (Signed)
Spoke with physician at [facility] re: Sleep study.  He denied approval for sleep study.  Told me that their guidelines will only support an in home study.

## 2011-09-14 NOTE — Telephone Encounter (Signed)
Please call the sleep lab and let them know that pt's insurance has denied authorization for sleep study.

## 2011-09-30 ENCOUNTER — Encounter (HOSPITAL_BASED_OUTPATIENT_CLINIC_OR_DEPARTMENT_OTHER): Payer: Private Health Insurance - Indemnity

## 2011-10-07 ENCOUNTER — Encounter (HOSPITAL_BASED_OUTPATIENT_CLINIC_OR_DEPARTMENT_OTHER): Payer: Private Health Insurance - Indemnity

## 2011-10-18 ENCOUNTER — Telehealth: Payer: Self-pay | Admitting: Family

## 2011-10-18 DIAGNOSIS — R400 Somnolence: Secondary | ICD-10-CM

## 2011-10-19 ENCOUNTER — Other Ambulatory Visit: Payer: Self-pay | Admitting: Family

## 2011-10-19 ENCOUNTER — Ambulatory Visit (INDEPENDENT_AMBULATORY_CARE_PROVIDER_SITE_OTHER): Payer: Private Health Insurance - Indemnity | Admitting: Pulmonary Disease

## 2011-10-19 DIAGNOSIS — G4733 Obstructive sleep apnea (adult) (pediatric): Secondary | ICD-10-CM

## 2011-10-19 DIAGNOSIS — R400 Somnolence: Secondary | ICD-10-CM

## 2011-10-20 ENCOUNTER — Telehealth: Payer: Self-pay | Admitting: Family

## 2011-10-20 DIAGNOSIS — G4730 Sleep apnea, unspecified: Secondary | ICD-10-CM

## 2011-10-20 NOTE — Telephone Encounter (Signed)
Please call pt and let her know that her sleep study shows some sleep apnea.  I would like for her to meet with pulmonary for further evaluation. Tiffany Gonzales will call her with apt.

## 2011-10-20 NOTE — Telephone Encounter (Signed)
Pt.notified

## 2011-10-25 ENCOUNTER — Institutional Professional Consult (permissible substitution): Payer: Private Health Insurance - Indemnity | Admitting: Pulmonary Disease

## 2011-11-08 ENCOUNTER — Encounter: Payer: Self-pay | Admitting: Pulmonary Disease

## 2011-11-08 ENCOUNTER — Ambulatory Visit (INDEPENDENT_AMBULATORY_CARE_PROVIDER_SITE_OTHER): Payer: Private Health Insurance - Indemnity | Admitting: Pulmonary Disease

## 2011-11-08 VITALS — BP 138/90 | HR 72 | Temp 99.0°F | Ht 64.0 in | Wt 154.0 lb

## 2011-11-08 DIAGNOSIS — G4733 Obstructive sleep apnea (adult) (pediatric): Secondary | ICD-10-CM | POA: Insufficient documentation

## 2011-11-08 NOTE — Progress Notes (Signed)
  Subjective:    Patient ID: RAYLI WIEDERHOLD, female    DOB: 09/08/1956, 55 y.o.   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 Snoring is horrible, boyfriend & daughter complaining Memory is affected C/o Nocturia  ESS7 Last took [place] ago, got Rx due to trouble sleeping. She has a glass of wine daily around 8pm, no excessive cafeine intake. Bedtime 10-11 pm, latency 10 mins- 1 hr, 2-3 awakenings, oob at 0630, feels tired - no dryness or headaches. Home study showed AHI 18/h with 22 central apneas & desaturations. There is no history suggestive of cataplexy, sleep paralysis or parasomnias    Review of Systems  Constitutional: Negative for fever and unexpected weight change.  HENT: Positive for congestion, sneezing and dental problem. Negative for ear pain, nosebleeds, sore throat, rhinorrhea, trouble swallowing, postnasal drip and sinus pressure.   Eyes: Negative for redness and itching.  Respiratory: Negative for cough, chest tightness, shortness of breath and wheezing.   Cardiovascular: Negative for palpitations and leg swelling.  Gastrointestinal: Negative for nausea and vomiting.  Genitourinary: Negative for dysuria.  Musculoskeletal: Positive for arthralgias. Negative for joint swelling.  Skin: Negative for rash.  Neurological: Negative for headaches.  Hematological: Does not bruise/bleed easily.  Psychiatric/Behavioral: Negative for dysphoric mood. The patient is nervous/anxious.        Objective:   Physical Exam  Gen. Pleasant, well-nourished, in no distress, normal affect ENT - no lesions, no post nasal drip, class 2 airway Neck: No JVD, no thyromegaly, no carotid bruits Lungs: no use of accessory muscles, no dullness to percussion, clear without rales or rhonchi  Cardiovascular: Rhythm regular, heart sounds  normal, no murmurs or gallops, no peripheral edema Abdomen: soft and non-tender, no hepatosplenomegaly,  BS normal. Musculoskeletal: No deformities, no cyanosis or clubbing Neuro:  alert, non focal       Assessment & Plan:

## 2011-11-08 NOTE — Patient Instructions (Addendum)
We will set you up for a CPAP titration study CPAP will be set up afterwards Please make an effort to cut down & stop smoking

## 2011-11-10 ENCOUNTER — Telehealth: Payer: Self-pay | Admitting: Family

## 2011-11-10 NOTE — Telephone Encounter (Signed)
Patient states that she has had a hard time hearing and would like a referral to ENT.

## 2011-11-11 NOTE — Telephone Encounter (Signed)
Please advise. ENT referral?   

## 2011-11-11 NOTE — Telephone Encounter (Signed)
I think that she should be seen in office first. We can do a basic hearing test, check her ears for fluid/infection and make sure that she does not have a wax blockage. We will determine best referral for her at that time. (Audiology or ENT).

## 2011-11-11 NOTE — Telephone Encounter (Signed)
Call placed to patient at [phone], no answer. A detailed voice message was left informing patient per Sandford Craze instructions.

## 2011-11-13 NOTE — Assessment & Plan Note (Signed)
Moderate obstructive sleep apnea  The pathophysiology of obstructive sleep apnea , it's cardiovascular consequences & modes of treatment including CPAP were discused with the patient in detail & they evidenced understanding. Given central apneas, would prefer for CPAP titration to be performed in the lab - This would enable [place] to determine if these are real & degree of complex sleep apnea - whether they disappear with titration Weight loss encouraged, compliance with goal of at least 4-6 hrs every night is the expectation. Advised against medications with sedative side effects Cautioned against driving when sleepy - understanding that sleepiness will vary on a day to day basis

## 2011-11-17 ENCOUNTER — Ambulatory Visit (INDEPENDENT_AMBULATORY_CARE_PROVIDER_SITE_OTHER): Payer: Private Health Insurance - Indemnity | Admitting: Internal Medicine

## 2011-11-17 ENCOUNTER — Encounter: Payer: Self-pay | Admitting: Internal Medicine

## 2011-11-17 DIAGNOSIS — H6690 Otitis media, unspecified, unspecified ear: Secondary | ICD-10-CM

## 2011-11-17 MED ORDER — AMOXICILLIN 875 MG PO TABS: 875.0000 mg | ORAL_TABLET | Freq: Two times a day (BID) | ORAL | Status: AC

## 2011-11-22 DIAGNOSIS — H9202 Otalgia, left ear: Secondary | ICD-10-CM | POA: Insufficient documentation

## 2011-11-22 NOTE — Progress Notes (Signed)
  Subjective:    Patient ID: Tiffany Gonzales, female    DOB: 1956/06/27, 56 y.o.   MRN: [id number]  HPI Pt presents to clinic for evaluation of decreased hearing. Notes 3wk h/o decreased bilateral hearing. Notes nasal congestion and drainage. No ear drainage, fever, chills. No other associated sx's. Taking no medication for the problem. No alleviating or exacerbating factors. No other complaints.  Past Medical History  Diagnosis Date  . Depression   . Panic attacks   . Hypertension   . Fibroids   . Colon polyps   . Atypical chest pain 01/2007    negative stress test  . Allergy     seasonal  . Hyperlipidemia    Past Surgical History  Procedure Date  . Abdominal hysterectomy 1998    reports that she has been smoking Cigarettes.  She has been smoking about .3 packs per day. She has never used smokeless tobacco. She reports that she drinks alcohol. She reports that she does not use illicit drugs. family history includes Arthritis in her mother and Hypertension in her father. No Known Allergies   Review of Systems see hpi     Objective:   Physical Exam  Nursing note and vitals reviewed. Constitutional: She appears well-developed and well-nourished. No distress.  HENT:  Head: Normocephalic and atraumatic.  Right Ear: Tympanic membrane, external ear and ear canal normal.  Left Ear: External ear and ear canal normal.  Nose: Nose normal.  Mouth/Throat: Oropharynx is clear and moist. No oropharyngeal exudate.       Left tm dullness  Eyes: Conjunctivae are normal. Right eye exhibits no discharge. Left eye exhibits no discharge. No scleral icterus.  Neck: Neck supple.  Neurological: She is alert.  Skin: Skin is warm and dry. She is not diaphoretic.  Psychiatric: She has a normal mood and affect.          Assessment & Plan:

## 2011-11-22 NOTE — Assessment & Plan Note (Signed)
Attempt course of abx with nasonex samples. Followup if no improvement or worsening.

## 2011-11-27 ENCOUNTER — Ambulatory Visit (HOSPITAL_BASED_OUTPATIENT_CLINIC_OR_DEPARTMENT_OTHER): Payer: Private Health Insurance - Indemnity

## 2011-12-06 ENCOUNTER — Other Ambulatory Visit: Payer: Self-pay | Admitting: *Deleted

## 2011-12-06 MED ORDER — SIMVASTATIN 20 MG PO TABS: 20.0000 mg | ORAL_TABLET | Freq: Every day | ORAL | Status: DC

## 2011-12-06 NOTE — Telephone Encounter (Signed)
Received fax refill request from [facility] for simvastatin 20mg . Refill sent via eRx #90 x 1 refill.

## 2011-12-08 ENCOUNTER — Telehealth: Payer: Self-pay | Admitting: *Deleted

## 2011-12-08 MED ORDER — LISINOPRIL 10 MG PO TABS: 10.0000 mg | ORAL_TABLET | Freq: Every day | ORAL | Status: DC

## 2011-12-08 NOTE — Telephone Encounter (Signed)
Spoke with CSR at [facility] and verified that eRx from 12/06/11 (simvastatin) is scanning in their system, she cannot verify name yet. Advised her that I also sent lisinopril via eRx this a.m. Asked her to remove these requests from their fax line as this is the 2nd request we have received for the simvastatin. She states she is not able to remove the fax from their system until we fax a response back. Response faxed to check their eRxs for authorization.

## 2011-12-26 ENCOUNTER — Ambulatory Visit: Payer: Private Health Insurance - Indemnity | Admitting: Family

## 2012-01-02 ENCOUNTER — Telehealth: Payer: Self-pay | Admitting: Family

## 2012-01-02 ENCOUNTER — Encounter: Payer: Self-pay | Admitting: Family

## 2012-01-02 ENCOUNTER — Ambulatory Visit (INDEPENDENT_AMBULATORY_CARE_PROVIDER_SITE_OTHER): Payer: Private Health Insurance - Indemnity | Admitting: Family

## 2012-01-02 DIAGNOSIS — G4733 Obstructive sleep apnea (adult) (pediatric): Secondary | ICD-10-CM

## 2012-01-02 DIAGNOSIS — M79609 Pain in unspecified limb: Secondary | ICD-10-CM

## 2012-01-02 DIAGNOSIS — M79643 Pain in unspecified hand: Secondary | ICD-10-CM

## 2012-01-02 DIAGNOSIS — E785 Hyperlipidemia, unspecified: Secondary | ICD-10-CM

## 2012-01-02 DIAGNOSIS — H9209 Otalgia, unspecified ear: Secondary | ICD-10-CM

## 2012-01-02 DIAGNOSIS — I10 Essential (primary) hypertension: Secondary | ICD-10-CM

## 2012-01-02 DIAGNOSIS — H9202 Otalgia, left ear: Secondary | ICD-10-CM

## 2012-01-02 LAB — HEPATIC FUNCTION PANEL
ALT: 15 U/L (ref 0–35)
AST: 16 U/L (ref 0–37)
Bilirubin, Direct: 0.1 mg/dL (ref 0.0–0.3)

## 2012-01-02 LAB — BASIC METABOLIC PANEL
BUN: 13 mg/dL (ref 6–23)
Chloride: 105 mEq/L (ref 96–112)
Glucose, Bld: 94 mg/dL (ref 70–99)
Potassium: 4.3 mEq/L (ref 3.5–5.3)

## 2012-01-02 MED ORDER — AMLODIPINE BESYLATE 5 MG PO TABS: 5.0000 mg | ORAL_TABLET | Freq: Every day | ORAL | Status: DC

## 2012-01-02 MED ORDER — MOMETASONE FUROATE 50 MCG/ACT NA SUSP: 2.0000 | Freq: Every day | NASAL | Status: DC

## 2012-01-02 NOTE — Patient Instructions (Signed)
Please complete your lab work prior to leaving. Follow up in 6 months, sooner if problems.

## 2012-01-02 NOTE — Telephone Encounter (Signed)
[facility] She will return to the lab for uric acid level.

## 2012-01-02 NOTE — Telephone Encounter (Signed)
Pt had requested refill of Nasonex that she said was prescribed by Dr Rodena Medin. She reports that it really helps her congestion. Please advise.

## 2012-01-02 NOTE — Progress Notes (Signed)
Subjective:    Patient ID: Tiffany Gonzales, female    DOB: [birth year], 56 y.o.   MRN: [id number]  HPI  Ms.  Northington is a 56 yr old female who presents today for follow up.  1) HTN- She reports that she is taking her lisinopril regularly.  Denies cp, sob, swelling.    2) Hyperlipemia- reports that she is taking simvastatin, denies myalgias.   3) Hand pain- Reports + swelling and pain in the right index PIP.  She has tried ibuprofen which has helped.    4) Otalgia-left ear pain x 3 weeks.  Was treated for OM by Dr. Rodena Medin in early January.  Never felt better with amoxicillin.  Actually thinks that the pain is worse, hearing is "about the same."  5) OSA- saw Dr. [name].  Reports that she was unable to tolerate the mask.      Review of Systems See HPI  Past Medical History  Diagnosis Date  . Depression   . Panic attacks   . Hypertension   . Fibroids   . Colon polyps   . Atypical chest pain 01/2007    negative stress test  . Allergy     seasonal  . Hyperlipidemia     History   Social History  . Marital Status: Single    Spouse Name: N/A    Number of Children: 2  . Years of Education: N/A   Occupational History  . 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 Smoking status: Current Everyday Smoker -- 0.3 packs/day    Types: Cigarettes  . Smokeless tobacco: Never Used  . Alcohol Use: Yes     Once a week  . Drug Use: No  . Sexually Active: Not on file   Other Topics Concern  . 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 Abdominal hysterectomy 1998    Family History  Problem Relation Age of Onset  . Arthritis Mother   . Hypertension Father     No Known Allergies  Current Outpatient Prescriptions on File Prior to Visit  Medication Sig Dispense Refill  . aspirin 81 MG tablet Take 81 mg by mouth daily.        Marland Kitchen  estradiol (ESTRACE VAGINAL) 0.1 MG/GM vaginal cream One applicator full in Vagina 3 times a week  42.5 g  0  . lisinopril (PRINIVIL,ZESTRIL) 10 MG tablet Take 1 tablet (10 mg total) by mouth daily.  90 tablet  1  . simvastatin (ZOCOR) 20 MG tablet Take 1 tablet (20 mg total) by mouth at bedtime.  90 tablet  1    BP 158/94  Pulse 65  Temp(Src) 98 F (36.7 C) (Oral)  Resp 16  Ht 5\' 3"  (1.6 m)  SpO2 99%       Objective:   Physical Exam  Constitutional: She appears well-developed and well-nourished. No distress.  HENT:  Head: Normocephalic and atraumatic.  Right Ear: Tympanic membrane and ear canal normal.  Left Ear: Tympanic membrane and ear canal normal.  Mouth/Throat: No posterior oropharyngeal edema or posterior oropharyngeal erythema.  Eyes: No scleral icterus.  Cardiovascular: Normal rate and regular rhythm.   No murmur heard. Pulmonary/Chest: Effort normal and breath sounds normal. No respiratory distress. She has no wheezes. She has no rales. She exhibits no tenderness.  Musculoskeletal: She exhibits no edema.  Skin: Skin  is warm and dry.  Psychiatric: She has a normal mood and affect. Her behavior is normal. Judgment and thought content normal.          Assessment & Plan:

## 2012-01-02 NOTE — Telephone Encounter (Signed)
NEEDS RX FOR NASONEX TO [facility]

## 2012-01-03 ENCOUNTER — Encounter: Payer: Self-pay | Admitting: Family

## 2012-01-04 ENCOUNTER — Telehealth: Payer: Self-pay | Admitting: Family

## 2012-01-04 DIAGNOSIS — M79643 Pain in unspecified hand: Secondary | ICD-10-CM | POA: Insufficient documentation

## 2012-01-04 NOTE — Telephone Encounter (Signed)
Please let pt know that I would like to see her back in 4-6 weeks please for BP recheck. thanks

## 2012-01-04 NOTE — Assessment & Plan Note (Signed)
We discussed risks of untreated sleep apnea.  Pt verbalizes understanding.  I recommended that she arrange follow up with Dr. [name] to discuss further options for her.

## 2012-01-04 NOTE — Telephone Encounter (Signed)
Spoke with pt and scheduled appt for 02/08/12 at 2:15pm.

## 2012-01-04 NOTE — Assessment & Plan Note (Signed)
Reports associated hearing loss.  Exam is normal.  Refer to ENT for further evaluation.

## 2012-01-04 NOTE — Assessment & Plan Note (Signed)
Mild.  Continue ibuprofen PRN.

## 2012-01-04 NOTE — Assessment & Plan Note (Addendum)
BP Readings from Last 3 Encounters:  01/02/12 158/94  11/17/11 140/80  11/08/11 138/90   Deteriorated. Add amlodipine.

## 2012-02-02 ENCOUNTER — Telehealth: Payer: Self-pay | Admitting: Family

## 2012-02-02 MED ORDER — AMLODIPINE BESYLATE 5 MG PO TABS: 5.0000 mg | ORAL_TABLET | Freq: Every day | ORAL | Status: DC

## 2012-02-02 NOTE — Telephone Encounter (Signed)
Verified with pt that she only got amlodipine 1 time at local pharmacy and will now need to use mail order. #90 x 1 refill sent to [facility].

## 2012-02-02 NOTE — Telephone Encounter (Signed)
New prescription request  Amlodipine 5mg  tab. 90 days supply

## 2012-02-08 ENCOUNTER — Ambulatory Visit: Payer: Private Health Insurance - Indemnity | Admitting: Family

## 2012-02-15 ENCOUNTER — Ambulatory Visit (HOSPITAL_BASED_OUTPATIENT_CLINIC_OR_DEPARTMENT_OTHER): Payer: Managed Care, Other (non HMO) | Attending: Otolaryngology | Admitting: Radiology

## 2012-02-15 VITALS — Ht 64.0 in | Wt 150.0 lb

## 2012-02-15 DIAGNOSIS — G4733 Obstructive sleep apnea (adult) (pediatric): Secondary | ICD-10-CM | POA: Insufficient documentation

## 2012-02-17 DIAGNOSIS — R0989 Other specified symptoms and signs involving the circulatory and respiratory systems: Secondary | ICD-10-CM

## 2012-02-17 DIAGNOSIS — R0609 Other forms of dyspnea: Secondary | ICD-10-CM

## 2012-02-17 DIAGNOSIS — G4733 Obstructive sleep apnea (adult) (pediatric): Secondary | ICD-10-CM

## 2012-02-18 NOTE — Procedures (Signed)
NAMEJAMIN, Tiffany Gonzales             ACCOUNT NO.:  [id number]  MEDICAL RECORD NO.:  [id number]          PATIENT TYPE:  OUT  LOCATION:  SLEEP CENTER                 FACILITY:  [facility]  PHYSICIAN:  Jayln Madeira D. Maple Hudson, MD, FCCP, FACPDATE OF BIRTH:  03[birth year]  DATE OF STUDY:                           NOCTURNAL POLYSOMNOGRAM  REFERRING PHYSICIAN:  Jefry H. Pollyann Kennedy, MD  REFERRING PHYSICIAN:  Jefry H. Pollyann Kennedy, MD  INDICATION FOR STUDY:  Hypersomnia with sleep apnea.  EPWORTH SLEEPINESS SCORE:  9/24.  BMI 25.7, weight 150 pounds.  Height 64 inches, neck 11.5 inches.  MEDICATIONS:  Home medications are charted and reviewed.  SLEEP ARCHITECTURE:  Total sleep time 340.5 minutes with sleep efficiency 19.7%.  Stage I was 8.1%, stage II 72.1%, stage III, 0.3%, REM 19.5% of total sleep time.  Sleep latency 14 minutes, REM latency 172 minutes, awake after sleep onset 22.5 minutes, arousal index 33.7.  BEDTIME MEDICATION:  Amlodipine, lisinopril, simvastatin, and aspirin.  RESPIRATORY DATA:  Apnea-hypopnea index (AHI) 13.4 per hour.  A total of 76 events was scored including 60 obstructive apneas, 4 central apneas, 3 mixed apneas, 9 hypopneas.  Events were more common while non-supine. REM AHI 55 per hour.  This was a diagnostic NPSG protocol as ordered.  OXYGEN DATA:  Moderate to loud snoring with oxygen desaturation to a nadir of 88% and mean oxygen saturation through the study of 95.2% on room air.  CARDIAC DATA:  Sinus rhythm with occasional PVC.  MOVEMENT-PARASOMNIA:  No significant movement disturbance.  Bathroom x1.  IMPRESSIONS-RECOMMENDATIONS: 1. Mild obstructive sleep apnea/hypopnea syndrome, apnea-hypopnea     index 13.4 per hour with non-supine and REM events.  Moderately     loud to loud snoring with oxygen desaturation to a nadir of 88% and     mean oxygen saturation through the study of 95.2% on room air. 2. The study was ordered as a diagnostic NPSG protocol.  Consider     return  for CPAP titration if clinically appropriate.     Siriyah Ambrosius D. Maple Hudson, MD, [facility], FACP Diplomate, American Board of Sleep Medicine    CDY/MEDQ  D:  02/17/2012 10:54:40  T:  02/18/2012 00:16:48  Job:  [id number]

## 2012-02-29 ENCOUNTER — Encounter (HOSPITAL_BASED_OUTPATIENT_CLINIC_OR_DEPARTMENT_OTHER): Payer: Private Health Insurance - Indemnity

## 2012-05-08 ENCOUNTER — Other Ambulatory Visit: Payer: Self-pay | Admitting: Family

## 2012-05-08 DIAGNOSIS — Z1231 Encounter for screening mammogram for malignant neoplasm of breast: Secondary | ICD-10-CM

## 2012-05-24 ENCOUNTER — Encounter (HOSPITAL_BASED_OUTPATIENT_CLINIC_OR_DEPARTMENT_OTHER): Payer: Self-pay | Admitting: *Deleted

## 2012-05-24 ENCOUNTER — Observation Stay (HOSPITAL_BASED_OUTPATIENT_CLINIC_OR_DEPARTMENT_OTHER)
Admission: EM | Admit: 2012-05-24 | Discharge: 2012-05-24 | Disposition: A | Discharge status: Home / Self Care | Payer: Managed Care, Other (non HMO) | Attending: Emergency Medicine | Admitting: Emergency Medicine

## 2012-05-24 DIAGNOSIS — F329 Major depressive disorder, single episode, unspecified: Secondary | ICD-10-CM | POA: Insufficient documentation

## 2012-05-24 DIAGNOSIS — T783XXA Angioneurotic edema, initial encounter: Principal | ICD-10-CM | POA: Insufficient documentation

## 2012-05-24 DIAGNOSIS — R21 Rash and other nonspecific skin eruption: Secondary | ICD-10-CM | POA: Insufficient documentation

## 2012-05-24 DIAGNOSIS — Z79899 Other long term (current) drug therapy: Secondary | ICD-10-CM | POA: Insufficient documentation

## 2012-05-24 DIAGNOSIS — F3289 Other specified depressive episodes: Secondary | ICD-10-CM | POA: Insufficient documentation

## 2012-05-24 DIAGNOSIS — E785 Hyperlipidemia, unspecified: Secondary | ICD-10-CM | POA: Insufficient documentation

## 2012-05-24 DIAGNOSIS — I10 Essential (primary) hypertension: Secondary | ICD-10-CM | POA: Insufficient documentation

## 2012-05-24 DIAGNOSIS — T7840XA Allergy, unspecified, initial encounter: Secondary | ICD-10-CM

## 2012-05-24 DIAGNOSIS — X58XXXA Exposure to other specified factors, initial encounter: Secondary | ICD-10-CM | POA: Insufficient documentation

## 2012-05-24 DIAGNOSIS — Y92009 Unspecified place in unspecified non-institutional (private) residence as the place of occurrence of the external cause: Secondary | ICD-10-CM | POA: Insufficient documentation

## 2012-05-24 DIAGNOSIS — F410 Panic disorder [episodic paroxysmal anxiety] without agoraphobia: Secondary | ICD-10-CM | POA: Insufficient documentation

## 2012-05-24 MED ORDER — DIPHENHYDRAMINE HCL 50 MG/ML IJ SOLN
50.0000 mg | Freq: Once | INTRAMUSCULAR | Status: AC
  Administered 2012-05-24: 50 mg via INTRAVENOUS
  Filled 2012-05-24: qty 1

## 2012-05-24 MED ORDER — PREDNISONE 50 MG PO TABS: ORAL_TABLET | ORAL | Status: AC

## 2012-05-24 MED ORDER — FAMOTIDINE IN NACL 20-0.9 MG/50ML-% IV SOLN
20.0000 mg | Freq: Two times a day (BID) | INTRAVENOUS | Status: DC
  Administered 2012-05-24: 20 mg via INTRAVENOUS
  Filled 2012-05-24: qty 50

## 2012-05-24 MED ORDER — DIPHENHYDRAMINE HCL 50 MG/ML IJ SOLN
25.0000 mg | Freq: Once | INTRAMUSCULAR | Status: AC
  Administered 2012-05-24: 25 mg via INTRAVENOUS
  Filled 2012-05-24: qty 1

## 2012-05-24 MED ORDER — EPINEPHRINE 0.3 MG/0.3ML IJ DEVI
0.3000 mg | Freq: Once | INTRAMUSCULAR | Status: DC
  Filled 2012-05-24: qty 0.3

## 2012-05-24 MED ORDER — EPINEPHRINE 0.3 MG/0.3ML IJ DEVI: 0.3000 mg | INTRAMUSCULAR | Status: DC | PRN

## 2012-05-24 MED ORDER — PREDNISONE 50 MG PO TABS
60.0000 mg | ORAL_TABLET | Freq: Every day | ORAL | Status: DC
  Administered 2012-05-24: 60 mg via ORAL
  Filled 2012-05-24: qty 1

## 2012-05-24 MED ORDER — EPINEPHRINE HCL 1 MG/ML IJ SOLN
INTRAMUSCULAR | Status: AC
  Administered 2012-05-24: 0.3 mg
  Filled 2012-05-24: qty 1

## 2012-05-24 MED ORDER — ONDANSETRON HCL 4 MG/2ML IJ SOLN: 4.0000 mg | Freq: Four times a day (QID) | INTRAMUSCULAR | Status: DC | PRN

## 2012-05-24 NOTE — ED Notes (Signed)
Gave Pt. Ice pk for her lip due to more edema noted.  Pt. Report given to Dr. Conseco.

## 2012-05-24 NOTE — ED Notes (Signed)
Allergic reaction. Hives. Lips swelling. No resp distress. States she was stung by several bees about 6 days ago and has been having some itching since that time but nothing like today.

## 2012-05-24 NOTE — ED Provider Notes (Signed)
History     CSN: [id number]  Arrival date & time 05/24/12  1316   First MD Initiated Contact with Patient 05/24/12 1338      Chief Complaint  Patient presents with  . Allergic Reaction     Patient is a 56 y.o. female presenting with allergic reaction. The history is provided by the patient.  Allergic Reaction The primary symptoms are  rash, angioedema and urticaria. The primary symptoms do not include wheezing, shortness of breath, cough, abdominal pain, nausea, vomiting, diarrhea or altered mental status. Episode onset: today. The problem has been gradually improving. This is a new problem.  The rash is associated with itching.  The angioedema is not associated with shortness of breath.   The onset of the reaction was associated with insect bite/sting. Significant symptoms also include itching.  pt reports bee sting over a week ago but did not have immediate rash She reports today noticed rash to her body and lip swelling No new meds No other known causes Denies history of severe allergic reaction  Family history - negative for allergic reaction  Past Medical History  Diagnosis Date  . Depression   . Panic attacks   . Hypertension   . Fibroids   . Colon polyps   . Atypical chest pain 01/2007    negative stress test  . Allergy     seasonal  . Hyperlipidemia     Past Surgical History  Procedure Date  . Abdominal hysterectomy 1998    Family History  Problem Relation Age of Onset  . Arthritis Mother   . Hypertension Father     History  Substance Use Topics  . Smoking status: Current Everyday Smoker -- 0.3 packs/day    Types: Cigarettes  . Smokeless tobacco: Never Used  . Alcohol Use: Yes     Once a week    OB History    Grav Para Term Preterm Abortions TAB SAB Ect Mult Living                  Review of Systems  Respiratory: Negative for cough, shortness of breath and wheezing.   Gastrointestinal: Negative for nausea, vomiting, abdominal pain and  diarrhea.  Skin: Positive for itching and rash.  Psychiatric/Behavioral: Negative for altered mental status.  All other systems reviewed and are negative.    Allergies  Iodinated diagnostic agents  Home Medications   Current Outpatient Rx  Name Route Sig Dispense Refill  . AMLODIPINE BESYLATE 5 MG PO TABS Oral Take 1 tablet (5 mg total) by mouth daily. 90 tablet 1  . ASPIRIN 81 MG PO TABS Oral Take 81 mg by mouth daily.      Marland Kitchen EPINEPHRINE 0.3 MG/0.3ML IJ DEVI Intramuscular Inject 0.3 mLs (0.3 mg total) into the muscle as needed. 2 Device 1  . ESTRADIOL 0.1 MG/GM VA CREA  One applicator full in Vagina 3 times a week 42.5 g 0  . MOMETASONE FUROATE 50 MCG/ACT NA SUSP Nasal Place 2 sprays into the nose daily. 17 g 5  . PREDNISONE 50 MG PO TABS  One tablet PO daily 4 tablet 0  . SIMVASTATIN 20 MG PO TABS Oral Take 1 tablet (20 mg total) by mouth at bedtime. 90 tablet 1    BP 140/68  Pulse 101  Temp 99.2 F (37.3 C) (Oral)  Resp 20  SpO2 98%  Physical Exam CONSTITUTIONAL: Well developed/well nourished HEAD AND FACE: Normocephalic/atraumatic EYES: EOMI/PERRL ENMT: Mucous membranes moist.  Lip swelling noted.  No tongue swelling, uvula midline, voice normal   NECK: supple no meningeal signs SPINE:entire spine nontender CV: S1/S2 noted, no murmurs/rubs/gallops noted LUNGS: Lungs are clear to auscultation bilaterally, no apparent distress ABDOMEN: soft, nontender, no rebound or guarding GU:no cva tenderness NEURO: Pt is awake/alert, moves all extremitiesx4 EXTREMITIES: pulses normal, full ROM SKIN: urticaria to neck and lower extremities PSYCH: no abnormalities of mood noted  ED Course  Procedures    1. Allergic reaction    2:18 PM Pt with lip swelling/urticaria.  She thought it was related to recent bee sting.  She is on lisinopril.  Will have her stop her lisinopril in case this is cause of angioedema.  Will observe in ED on allergic reaction protocol.  Pt otherwise  well/stable at this time   MDM  Nursing notes including past medical history and social history reviewed and considered in documentation         Joya Gaskins, MD 05/24/12 1420

## 2012-05-24 NOTE — ED Notes (Signed)
Pt. In no distress after meds given

## 2012-05-24 NOTE — ED Provider Notes (Signed)
BP 102/64  Pulse 76  Temp 98.8 F (37.1 C) (Oral)  Resp 18  SpO2 100%  Patient without worsening symptoms in the emergency department. Her symptoms began approximately 10 hours ago. Unlikely rebound at this time. Provided epi pen and prednisone by Dr. Bebe Shaggy. Will take OTC benadryl. D/C lisinopril and f/u with PMD for recommendations regarding antihypertensives.  No EMC precluding discharge at this time. Given Precautions for return. PMD f/u.   Forbes Cellar, MD 05/24/12 [phone]

## 2012-06-05 ENCOUNTER — Ambulatory Visit (INDEPENDENT_AMBULATORY_CARE_PROVIDER_SITE_OTHER): Payer: Managed Care, Other (non HMO) | Admitting: Family

## 2012-06-05 ENCOUNTER — Ambulatory Visit
Admission: RE | Admit: 2012-06-05 | Discharge: 2012-06-05 | Disposition: A | Discharge status: Home / Self Care | Payer: Managed Care, Other (non HMO) | Source: Ambulatory Visit | Attending: Family | Admitting: Family

## 2012-06-05 ENCOUNTER — Encounter: Payer: Self-pay | Admitting: Family

## 2012-06-05 ENCOUNTER — Telehealth: Payer: Self-pay | Admitting: *Deleted

## 2012-06-05 VITALS — BP 110/76 | HR 80 | Temp 99.4°F | Resp 16 | Ht 63.0 in | Wt 153.1 lb

## 2012-06-05 DIAGNOSIS — Z1231 Encounter for screening mammogram for malignant neoplasm of breast: Secondary | ICD-10-CM

## 2012-06-05 DIAGNOSIS — I10 Essential (primary) hypertension: Secondary | ICD-10-CM

## 2012-06-05 DIAGNOSIS — T7840XA Allergy, unspecified, initial encounter: Secondary | ICD-10-CM

## 2012-06-05 MED ORDER — AMLODIPINE BESYLATE 5 MG PO TABS: 5.0000 mg | ORAL_TABLET | Freq: Every day | ORAL | Status: DC

## 2012-06-05 MED ORDER — SIMVASTATIN 20 MG PO TABS: 20.0000 mg | ORAL_TABLET | Freq: Every day | ORAL | Status: DC

## 2012-06-05 MED ORDER — PREDNISONE 10 MG PO TABS: ORAL_TABLET | ORAL | Status: DC

## 2012-06-05 MED ORDER — CETIRIZINE HCL 10 MG PO TABS: 10.0000 mg | ORAL_TABLET | Freq: Every day | ORAL | Status: DC

## 2012-06-05 NOTE — Telephone Encounter (Signed)
Received call from pharmacy requesting clarification of prednisone directions.  Tiffany Gonzales per Provider's instruction that directions are correct except we need to include 2 tabs once daily x 2 days. Rx will be corrected.

## 2012-06-05 NOTE — Assessment & Plan Note (Signed)
BP stable despite discontinuation of lisinopril.  I have asked the patient to remain off of lisinopril and continue amlodipine.

## 2012-06-05 NOTE — Assessment & Plan Note (Signed)
Cause unclear. It is possible that this is residual allergic reaction to ACE.  I have advised her to remain off of ACE.  Will place back on prednisone taper- 60mg  x 2 days, 50mg  x 2 days, 40mg  x 2 days 30 mg x 2 days, 20mg  x 2 days, 10mg  x 2 days then stop #36 (clarified dosing with pharmacy downstairs).  Also advised her to add zyrtec once daily. OK to take benadryl as needed.  Has epi pen for severe symptoms.  Advised pt to use if tongue/lip swelling or SOB and then to go directly to ED.  Pt verbalizes understanding. Continue to avoid shellfish. Refer to Allergist for further evaluation.

## 2012-06-05 NOTE — Patient Instructions (Addendum)
You will be contacted about your referral to the allergist.  Do not take lisinopril. Please use epi pen and go to ED if you develop shortness of breath, tongue/lip swelling. Follow up in 3 months.

## 2012-06-05 NOTE — Progress Notes (Signed)
Subjective:    Patient ID: Tiffany Gonzales, female    DOB: [birth year], 56 y.o.   MRN: [id number]  HPI  Ms.  Gonzales is a 56 yr old female who presents today with chief complaint of allergic reaction.  She presented to the ED downstairs on 7/4 with urticaria.  She reports that symptoms started several days prior, but it then worsened. She then developed swollen lips.  She did not develop tongue swelling or shortness of breath.  ED records are reviewed. She was given epinephrine in the ER and told to discontinue lisinopril which she tells me she has done.   Symptoms improved in the ED and pt was ultimately sent home on given prednisone 50 mg daily x 5 days and benadryl.  Rash and symptoms completely resolved.    Approximately 1 week after completion of prednisone on Sunday night 7/14,  she started to break out again on the left forearm and developed associated redness and + itching.  She still has this irritation.  She has had some symptomatic improvement with the use of ice to the area. She denies associated tongue/lip swelling or shortness of breath.    Review of Systems See HPI  Past Medical History  Diagnosis Date  . Depression   . Panic attacks   . Hypertension   . Fibroids   . Colon polyps   . Atypical chest pain 01/2007    negative stress test  . Allergy     seasonal  . Hyperlipidemia     History   Social History  . Marital Status: Single    Spouse Name: N/A    Number of Children: 2  . Years of Education: N/A   Occupational History  . 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 Smoking status: Current Everyday Smoker -- 0.3 packs/day    Types: Cigarettes  . Smokeless tobacco: Never Used  . Alcohol Use: Yes     Once a week  . Drug Use: No  . Sexually Active: Not on file   Other Topics Concern  . 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 Abdominal hysterectomy 1998    Family History  Problem Relation Age of Onset  . Arthritis Mother   . Hypertension Father     Allergies  Allergen Reactions  . Iodinated Diagnostic Agents Shortness Of Breath    Current Outpatient Prescriptions on File Prior to Visit  Medication Sig Dispense Refill  . aspirin 81 MG tablet Take 81 mg by mouth daily.        Marland Kitchen EPINEPHrine (EPIPEN) 0.3 mg/0.3 mL DEVI Inject 0.3 mLs (0.3 mg total) into the muscle as needed.  2 Device  1  . estradiol (ESTRACE VAGINAL) 0.1 MG/GM vaginal cream One applicator full in Vagina 3 times a week  42.5 g  0  . DISCONTD: amLODipine (NORVASC) 5 MG tablet Take 1 tablet (5 mg total) by mouth daily.  90 tablet  1  . DISCONTD: simvastatin (ZOCOR) 20 MG tablet Take 1 tablet (20 mg total) by mouth at bedtime.  90 tablet  1  . cetirizine (ZYRTEC) 10 MG tablet Take 1 tablet (10 mg total) by mouth daily.  30 tablet  11    BP 110/76  Pulse 80  Temp 99.4 F (37.4 C) (Oral)  Resp 16  Ht 5\' 3"  (1.6  m)  Wt 153 lb 1.3 oz (69.437 kg)  BMI 27.12 kg/m2  SpO2 99%       Objective:   Physical Exam  Constitutional: She appears well-developed and well-nourished. No distress.  HENT:       No tongue or lip swelling is noted.   Cardiovascular: Normal rate and regular rhythm.   No murmur heard. Pulmonary/Chest: Effort normal and breath sounds normal. No respiratory distress. She has no wheezes. She has no rales. She exhibits no tenderness.  Skin:       Left forearm with warm induration left forearm most consistent with urticaria.   No other rash is noted.   Psychiatric: She has a normal mood and affect. Her behavior is normal. Judgment and thought content normal.          Assessment & Plan:

## 2012-07-13 ENCOUNTER — Institutional Professional Consult (permissible substitution): Payer: Managed Care, Other (non HMO) | Admitting: Internal Medicine

## 2012-07-17 ENCOUNTER — Institutional Professional Consult (permissible substitution): Payer: Managed Care, Other (non HMO) | Admitting: Internal Medicine

## 2012-08-28 ENCOUNTER — Institutional Professional Consult (permissible substitution): Payer: Managed Care, Other (non HMO) | Admitting: Internal Medicine

## 2012-09-03 ENCOUNTER — Ambulatory Visit (INDEPENDENT_AMBULATORY_CARE_PROVIDER_SITE_OTHER): Payer: Managed Care, Other (non HMO) | Admitting: Family

## 2012-09-03 ENCOUNTER — Encounter: Payer: Self-pay | Admitting: Family

## 2012-09-03 VITALS — BP 122/78 | HR 72 | Temp 98.8°F | Resp 12 | Ht 63.0 in | Wt 157.0 lb

## 2012-09-03 DIAGNOSIS — F329 Major depressive disorder, single episode, unspecified: Secondary | ICD-10-CM

## 2012-09-03 DIAGNOSIS — T7840XA Allergy, unspecified, initial encounter: Secondary | ICD-10-CM

## 2012-09-03 DIAGNOSIS — I10 Essential (primary) hypertension: Secondary | ICD-10-CM

## 2012-09-03 DIAGNOSIS — E785 Hyperlipidemia, unspecified: Secondary | ICD-10-CM

## 2012-09-03 DIAGNOSIS — E7800 Pure hypercholesterolemia, unspecified: Secondary | ICD-10-CM

## 2012-09-03 DIAGNOSIS — N952 Postmenopausal atrophic vaginitis: Secondary | ICD-10-CM

## 2012-09-03 MED ORDER — ESTROGENS, CONJUGATED 0.625 MG/GM VA CREA: TOPICAL_CREAM | Freq: Every day | VAGINAL | Status: DC

## 2012-09-03 MED ORDER — ESTRADIOL 0.1 MG/GM VA CREA: TOPICAL_CREAM | VAGINAL | Status: DC

## 2012-09-03 NOTE — Assessment & Plan Note (Signed)
Pt reports that her insurance is requesting switch to premarin cream.  Rx sent.  (called [facility] and cancelled rx for estrace) She is due for pelvic exam next visit.

## 2012-09-03 NOTE — Assessment & Plan Note (Signed)
Stable off of meds.  

## 2012-09-03 NOTE — Progress Notes (Signed)
  Subjective:    Patient ID: Tiffany Gonzales, female    DOB: 1956-10-29, 56 y.o.   MRN: [id number]  HPI  Ms.  Deupree is a 56 yr old female who presents today for follow up.  1) Depression-  Not currently on medications.  Reports good mood, no concerns re: depresion.  2) HTN-  Pt continues amlodipine. Denies chest pain, SOB or swelling.   3) Hyperlipidemia-  Last LDL 96 back in 8/12.  She continues simvastatin. Denies myalgias.   Review of Systems See HPI  Past Medical History  Diagnosis Date  . Depression   . Panic attacks   . Hypertension   . Fibroids   . Colon polyps   . Atypical chest pain 01/2007    negative stress test  . Allergy     seasonal  . Hyperlipidemia     History   Social History  . Marital Status: Single    Spouse Name: N/A    Number of Children: 2  . Years of Education: N/A   Occupational History  . 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 Smoking status: Current Every Day Smoker -- 0.3 packs/day    Types: Cigarettes  . Smokeless tobacco: Never Used  . Alcohol Use: Yes     Once a week  . Drug Use: No  . Sexually Active: Not on file   Other Topics Concern  . 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 Abdominal hysterectomy 1998    Family History  Problem Relation Age of Onset  . Arthritis Mother   . Hypertension Father     Allergies  Allergen Reactions  . Iodinated Diagnostic Agents Shortness Of Breath    Current Outpatient Prescriptions on File Prior to Visit  Medication Sig Dispense Refill  . amLODipine (NORVASC) 5 MG tablet Take 1 tablet (5 mg total) by mouth daily.  90 tablet  1  . aspirin 81 MG tablet Take 81 mg by mouth daily.        . cetirizine (ZYRTEC) 10 MG tablet Take 1 tablet (10 mg total) by mouth daily.  30 tablet  11  . EPINEPHrine (EPIPEN) 0.3 mg/0.3 mL DEVI Inject  0.3 mLs (0.3 mg total) into the muscle as needed.  2 Device  1  . estradiol (ESTRACE VAGINAL) 0.1 MG/GM vaginal cream One applicator full in Vagina 3 times a week  42.5 g  0  . simvastatin (ZOCOR) 20 MG tablet Take 1 tablet (20 mg total) by mouth at bedtime.  90 tablet  1    BP 122/78  Pulse 72  Temp 98.8 F (37.1 C) (Oral)  Resp 12  Ht 5\' 3"  (1.6 m)  Wt 157 lb 0.6 oz (71.233 kg)  BMI 27.82 kg/m2  SpO2 99%       Objective:   Physical Exam  Constitutional: She appears well-developed and well-nourished. No distress.  Cardiovascular: Normal rate and regular rhythm.   No murmur heard. Pulmonary/Chest: Effort normal and breath sounds normal. No respiratory distress. She has no wheezes. She has no rales. She exhibits no tenderness.  Musculoskeletal: She exhibits no edema.  Skin: Skin is warm and dry.  Psychiatric: She has a normal mood and affect. Her behavior is normal. Judgment and thought content normal.          Assessment & Plan:

## 2012-09-03 NOTE — Patient Instructions (Addendum)
Please complete your blood work prior to leaving.  Please schedule a follow up appointment for follow up and GYN exam in 3 months.

## 2012-09-03 NOTE — Assessment & Plan Note (Signed)
She reports that she continues antihistamines without any further allergy symptoms.  She has rescheduled her allergy apt due to her work schedule.

## 2012-09-03 NOTE — Assessment & Plan Note (Signed)
Obtain FLP, LFT, tolerating statin.  Continue same.

## 2012-09-03 NOTE — Assessment & Plan Note (Signed)
BP stable.  Continue amlodipine. 

## 2012-09-04 ENCOUNTER — Encounter: Payer: Self-pay | Admitting: Family

## 2012-09-04 LAB — HEPATIC FUNCTION PANEL
ALT: 19 U/L (ref 0–35)
AST: 16 U/L (ref 0–37)
Albumin: 4.2 g/dL (ref 3.5–5.2)
Alkaline Phosphatase: 54 U/L (ref 39–117)
Indirect Bilirubin: 0.3 mg/dL (ref 0.0–0.9)
Total Protein: 6.5 g/dL (ref 6.0–8.3)

## 2012-09-04 LAB — BASIC METABOLIC PANEL WITH GFR
BUN: 11 mg/dL (ref 6–23)
CO2: 29 mEq/L (ref 19–32)
Chloride: 107 mEq/L (ref 96–112)
Creat: 0.73 mg/dL (ref 0.50–1.10)
GFR, Est Non African American: >89 mL/min
Glucose, Bld: 86 mg/dL (ref 70–99)
Potassium: 4.1 mEq/L (ref 3.5–5.3)

## 2012-09-04 LAB — LIPID PANEL
Cholesterol: 176 mg/dL (ref 0–200)
Triglycerides: 57 mg/dL (ref <150)
VLDL: 11 mg/dL (ref 0–40)

## 2012-09-05 ENCOUNTER — Telehealth: Payer: Self-pay | Admitting: *Deleted

## 2012-09-05 NOTE — Telephone Encounter (Signed)
Received message from [facility] that clarification is needed for recent Rx received.

## 2012-09-06 MED ORDER — SIMVASTATIN 20 MG PO TABS: 20.0000 mg | ORAL_TABLET | Freq: Every day | ORAL | Status: DC

## 2012-09-06 MED ORDER — AMLODIPINE BESYLATE 5 MG PO TABS: 5.0000 mg | ORAL_TABLET | Freq: Every day | ORAL | Status: DC

## 2012-09-06 NOTE — Telephone Encounter (Signed)
Pt called requesting we contact [facility] at [phone] for Premarin rx.Contacted [facility] verified order f or Premarin vaginal cream to use twice weekly.  Pt also requests refill of Amlodipine and Simvistatin. Rx sent to [facility] for refills of Amlodipine 90 days with 1 refill; Simvistatin 90 days with 1 refill.

## 2012-10-09 ENCOUNTER — Other Ambulatory Visit: Payer: Self-pay | Admitting: Internal Medicine

## 2012-10-09 ENCOUNTER — Encounter: Payer: Self-pay | Admitting: Internal Medicine

## 2012-10-09 ENCOUNTER — Other Ambulatory Visit: Payer: Managed Care, Other (non HMO)

## 2012-10-09 ENCOUNTER — Ambulatory Visit (INDEPENDENT_AMBULATORY_CARE_PROVIDER_SITE_OTHER): Payer: Managed Care, Other (non HMO) | Admitting: Internal Medicine

## 2012-10-09 VITALS — BP 110/62 | HR 64 | Ht 64.0 in | Wt 161.0 lb

## 2012-10-09 DIAGNOSIS — T7840XA Allergy, unspecified, initial encounter: Secondary | ICD-10-CM

## 2012-10-09 DIAGNOSIS — L509 Urticaria, unspecified: Secondary | ICD-10-CM

## 2012-10-09 DIAGNOSIS — J309 Allergic rhinitis, unspecified: Secondary | ICD-10-CM

## 2012-10-09 DIAGNOSIS — G4733 Obstructive sleep apnea (adult) (pediatric): Secondary | ICD-10-CM

## 2012-10-09 DIAGNOSIS — F17200 Nicotine dependence, unspecified, uncomplicated: Secondary | ICD-10-CM

## 2012-10-09 MED ORDER — MONTELUKAST SODIUM 10 MG PO TABS: 10.0000 mg | ORAL_TABLET | Freq: Every day | ORAL | Status: DC

## 2012-10-09 NOTE — Patient Instructions (Addendum)
Order- lab- Allergy Profile, Food allergy IgE profile, Stinging insect allergy profile  Try montelukast/ Singulair for a month, while weaning off the benadryl and zyrtec as you find you are able

## 2012-10-09 NOTE — Progress Notes (Signed)
10/09/12- 76 yoF current smoker referred courtesy of  Macario Carls- NP; angioedema and shellfish allergy  She gives a chronic history of perennial rhinitis/hay fever since at least her teenage years, with no asthma. Much nose blowing. Shellfish- shrimp and crab-close distinct increase nasal congestion. Turbinate reduction surgery did help some in  2007, but she continues to feel too stuffy to wear CPAP.   In July 2013 she had a yellow jacket sting. 3 or 4 days later she had urticaria with angioedema of the lips and went to emergency room. Lisinopril was blamed. She was treated with Benadryl and Zyrtec. I discussed with her the possibility that the yellow jacket sting "primed" her system in conjunction with her lisinopril. She has always had "sensitive skin". She says she smokes 7 cigarettes per day. One glass of wine. Family history is negative for sleep apnea or allergies.  Prior to Admission medications   Medication Sig Start Date End Date Taking? Authorizing Provider  amLODipine (NORVASC) 5 MG tablet Take 1 tablet (5 mg total) by mouth daily. 09/06/12 09/06/13 Yes Sandford Craze, NP  aspirin 81 MG tablet Take 81 mg by mouth daily.     Yes Historical Provider, MD  cetirizine (ZYRTEC) 10 MG tablet Take 1 tablet (10 mg total) by mouth daily. 06/05/12 06/05/13 Yes Sandford Craze, NP  conjugated estrogens (PREMARIN) vaginal cream Place vaginally daily. 0.5 grams in vaginal twice weekly 09/03/12  Yes Sandford Craze, NP  diphenhydrAMINE (BENADRYL) 25 mg capsule Take 25 mg by mouth at bedtime as needed.   Yes Historical Provider, MD  EPINEPHrine (EPIPEN) 0.3 mg/0.3 mL DEVI Inject 0.3 mLs (0.3 mg total) into the muscle as needed. 05/24/12  Yes Joya Gaskins, MD  simvastatin (ZOCOR) 20 MG tablet Take 1 tablet (20 mg total) by mouth at bedtime. 09/06/12  Yes Sandford Craze, NP  montelukast (SINGULAIR) 10 MG tablet Take 1 tablet (10 mg total) by mouth at bedtime. 10/09/12 10/09/13   Waymon Budge, MD   Past Medical History  Diagnosis Date  . Depression   . Panic attacks   . Hypertension   . Fibroids   . Colon polyps   . Atypical chest pain 01/2007    negative stress test  . Allergy     seasonal  . Hyperlipidemia    Past Surgical History  Procedure Date  . Abdominal hysterectomy 1998  . Nasal sinus surgery 2007    Dr Esperanza Richters out area to help breath   Family History  Problem Relation Age of Onset  . Arthritis Mother   . Hypertension Father   . Heart disease Maternal Grandmother   . Heart disease Paternal Grandmother    History   Social History  . Marital Status: Single    Spouse Name: N/A    Number of Children: 2  . Years of Education: N/A   Occupational History  . 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 Smoking status: Current Every Day Smoker -- 0.3 packs/day for 20 years    Types: Cigarettes  . Smokeless tobacco: Never Used  . Alcohol Use: 0.6 oz/week    1 Glasses of wine per week     Comment: white wine  . Drug Use: No  . Sexually Active: Not on file   Other Topics Concern  . 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 HEENT:   No-  headaches, difficulty swallowing, tooth/dental problems, sore throat,       + sneezing, itching,  nasal congestion, post nasal drip,  CV:  No-   chest pain, orthopnea, PND, swelling in lower extremities, anasarca, dizziness, palpitations Resp: No-   shortness of breath with exertion or at rest.              No-   productive cough,  + non-productive cough,  No- coughing up of blood.              No-   change in color of mucus.  No- wheezing.   Skin: +  rash or lesions. GI:  No-   heartburn, indigestion, abdominal pain, nausea, vomiting, diarrhea,                 change in bowel habits, loss of  appetite GU: No-   dysuria, change in color of urine, no urgency or frequency.  No- flank pain. MS:  No-   joint pain or swelling.  No- decreased range of motion.  No- back pain. Neuro-     nothing unusual Psych:  No- change in mood or affect. No depression or anxiety.  No memory loss.  OBJ- Physical Exam General- Alert, Oriented, Affect-appropriate, Distress- none acute. Medium build Skin- rash-none, lesions- none, excoriation- none Lymphadenopathy- none Head- atraumatic            Eyes- Gross vision intact, PERRLA, conjunctivae and secretions clear            Ears- Hearing, canals-normal            Nose- + Wet, Clear, no-Septal dev,  polyps, erosion, perforation             Throat- Mallampati IV , mucosa clear , drainage- none, tonsils- atrophic Neck- flexible , trachea midline, no stridor , thyroid nl, carotid no bruit Chest - symmetrical excursion , unlabored           Heart/CV- RRR , no murmur , no gallop  , no rub, nl s1 s2                           - JVD- none , edema- none, stasis changes- none, varices- none           Lung- clear to P&A, wheeze- none, cough- none , dullness-none, rub- none           Chest wall-  Abd- tender-no, distended-no, bowel sounds-present, HSM- no Br/ Gen/ Rectal- Not done, not indicated Extrem- cyanosis- none, clubbing, none, atrophy- none, strength- nl Neuro- grossly intact to observation

## 2012-10-10 LAB — ALLERGEN HYMENOPTERA PANEL: Honey Bee IgE: <0.1 kU/L

## 2012-10-10 LAB — ALLERGY FULL AND FOOD SPECIFIC PROFILE
Allergen,Goose feathers, e70: <0.1 kU/L
Aspergillus fumigatus, IgG: <0.1 kU/L
Bermuda Grass: <0.1 kU/L
Box Elder IgE: 0.56 kU/L — ABNORMAL HIGH
Candida Albicans: 3.86 kU/L — ABNORMAL HIGH
Common Ragweed: 0.31 kU/L — ABNORMAL HIGH
Dog Dander: <0.1 kU/L
Egg White IgE: <0.1 kU/L
G005 Rye, Perennial: 1.44 kU/L — ABNORMAL HIGH
Goldenrod: <0.1 kU/L
Helminthosporium halodes: <0.1 kU/L
House Dust Hollister: <0.1 kU/L
IgE (Immunoglobulin E), Serum: 131.4 IU/mL (ref 0.0–180.0)
Orange: <0.1 kU/L
Peanut IgE: <0.1 kU/L
Plantain: 0.13 kU/L — ABNORMAL HIGH
Shrimp IgE: <0.1 kU/L
Soybean IgE: <0.1 kU/L
Stemphylium Botryosum: <0.1 kU/L
Tomato IgE: <0.1 kU/L
Wheat IgE: <0.1 kU/L

## 2012-10-19 ENCOUNTER — Encounter: Payer: Self-pay | Admitting: Internal Medicine

## 2012-10-19 NOTE — Assessment & Plan Note (Signed)
Limited CPAP compliance partly because of nasal congestion. We discussed this.

## 2012-10-19 NOTE — Assessment & Plan Note (Signed)
Tobacco exposure is likely to aggravate nasal symptoms as discussed.

## 2012-10-19 NOTE — Assessment & Plan Note (Signed)
The combination of insect sting and lisinopril may have been enough to trigger urticaria/angioedema, but we have no proof as discussed with her. We can look at the status of her IgE system. Plan-okay to wean off with Benadryl and Zyrtec as tolerated. Allergy profiles, stinging insect profile.

## 2012-10-26 NOTE — Progress Notes (Signed)
Quick Note:  LMTCB ______ 

## 2012-10-29 ENCOUNTER — Telehealth: Payer: Self-pay | Admitting: Internal Medicine

## 2012-10-29 NOTE — Telephone Encounter (Signed)
Pt returned [name] call.  Tiffany Gonzales

## 2012-10-29 NOTE — Progress Notes (Signed)
Quick Note:  Spoke with patient, informed her of results and recs per Dr. Maple Hudson as listed below. Nothing further needed at this time. ______

## 2012-10-29 NOTE — Telephone Encounter (Signed)
ATC patient, no answer LMOMTCB 

## 2012-10-29 NOTE — Progress Notes (Signed)
Quick Note:  ATC patient no answer, lmomtcb ______

## 2012-10-29 NOTE — Telephone Encounter (Signed)
Spoke with patient informed her of results/recs as lsited below per Dr. Maple Hudson. Verbalized understanding of this and nothing further needed at this time.   Notes Recorded by Waymon Budge, MD on 10/24/2012 at 12:52 PM Alle3rgy antibody levels are elevated for rass, weed and tree pollens, apple, and also bees and wasps. We will discuss at next ov.

## 2012-11-06 ENCOUNTER — Encounter: Payer: Self-pay | Admitting: Internal Medicine

## 2012-11-06 ENCOUNTER — Ambulatory Visit (INDEPENDENT_AMBULATORY_CARE_PROVIDER_SITE_OTHER): Payer: Managed Care, Other (non HMO) | Admitting: Internal Medicine

## 2012-11-06 VITALS — BP 114/68 | HR 76 | Ht 64.0 in | Wt 157.2 lb

## 2012-11-06 DIAGNOSIS — F17200 Nicotine dependence, unspecified, uncomplicated: Secondary | ICD-10-CM

## 2012-11-06 DIAGNOSIS — Z91038 Other insect allergy status: Secondary | ICD-10-CM

## 2012-11-06 DIAGNOSIS — T7840XA Allergy, unspecified, initial encounter: Secondary | ICD-10-CM

## 2012-11-06 NOTE — Patient Instructions (Addendum)
Ok to continue Singulair/ montelukast and to add an antihistamine like Claritin/ loratadine or Allegra/ fexofenadine as needed  If this isn't enough we can add a nasal spray though a bad season  Allergy vaccine is an option for the long term if needed  You are at potential danger from the Hymenoptera stinging insects- wasps, bees and yellow jackets. Avoid when you can, keep Epipen available, and consider pretreating with an antihistamine if yuo are working out-doors.   Pay attention and avoid those foods that clearly cause you problems.

## 2012-11-06 NOTE — Progress Notes (Signed)
10/09/12- 55 yoF current smoker referred courtesy of  Macario Carls- NP; angioedema and shellfish allergy  She gives a chronic history of perennial rhinitis/hay fever since at least her teenage years, with no asthma. Much nose blowing. Shellfish- shrimp and crab-close distinct increase nasal congestion. Turbinate reduction surgery did help some in  2007, but she continues to feel too stuffy to wear CPAP.   In July 2013 she had a yellow jacket sting. 3 or 4 days later she had urticaria with angioedema of the lips and went to emergency room. Lisinopril was blamed. She was treated with Benadryl and Zyrtec. I discussed with her the possibility that the yellow jacket sting "primed" her system in conjunction with her lisinopril. She has always had "sensitive skin". She says she smokes 7 cigarettes per day. One glass of wine. Family history is negative for sleep apnea or allergies.  11/06/12-56 yoF current smoker referred courtesy of  Macario Carls- NP; angioedema and shellfish allergy  FOLLOWS FOR: currently on singulair and helping per patient; review all allergy labs in detail with patient. She has been able to stop taking Zyrtec and Benadryl. Cough is gone. She is still smoking against advice and we discussed that again today. Lab: Stinging Insect/Hymenoptera IgE elevated to all except honeybees. History of systemic reaction to yellow jacket sting Allergy Profile 10/09/2012-total IgE 131.4, elevated for apple which she eats without problem, for grass pollens and weed pollens  ROS-see HPI Constitutional:   No-   weight loss, night sweats, fevers, chills, fatigue, lassitude. HEENT:   No-  headaches, difficulty swallowing, tooth/dental problems, sore throat,       + sneezing, itching,  nasal congestion, post nasal drip,  CV:  No-   chest pain, orthopnea, PND, swelling in lower extremities, anasarca, dizziness, palpitations Resp: No-   shortness of breath with exertion or at rest.    No-   productive cough,  + non-productive cough,  No- coughing up of blood.              No-   change in color of mucus.  No- wheezing.   Skin: +  rash or lesions. GI:  No-   heartburn, indigestion, abdominal pain, nausea, vomiting,  GU:  MS:  No-   joint pain or swelling. . Neuro-     nothing unusual Psych:  No- change in mood or affect. No depression or anxiety.  No memory loss.  OBJ- Physical Exam General- Alert, Oriented, Affect-appropriate, Distress- none acute. Medium build Skin- rash-none, lesions- none, excoriation- none Lymphadenopathy- none Head- atraumatic            Eyes- Gross vision intact, PERRLA, conjunctivae and secretions clear            Ears- Hearing, canals-normal            Nose- + Wet, Clear, no-Septal dev,  polyps, erosion, perforation             Throat- Mallampati IV , mucosa clear , drainage- none, tonsils- atrophic Neck- flexible , trachea midline, no stridor , thyroid nl, carotid no bruit Chest - symmetrical excursion , unlabored           Heart/CV- RRR , no murmur , no gallop  , no rub, nl s1 s2                           - JVD- none , edema- none, stasis changes- none, varices- none  Lung- clear to P&A, wheeze- none, cough- none , dullness-none, rub- none           Chest wall-  Abd-  Br/ Gen/ Rectal- Not done, not indicated Extrem- cyanosis- none, clubbing, none, atrophy- none, strength- nl Neuro- grossly intact to observation

## 2012-11-18 DIAGNOSIS — Z91038 Other insect allergy status: Secondary | ICD-10-CM | POA: Insufficient documentation

## 2012-11-18 NOTE — Assessment & Plan Note (Signed)
The importance of smoking cessation and available support measures were reviewed again.

## 2012-11-18 NOTE — Assessment & Plan Note (Signed)
Remain off of angiotensin converting enzyme inhibitors. Continue Singulair. Antihistamine when needed

## 2012-11-18 NOTE — Assessment & Plan Note (Signed)
We have reviewed avoidance of stings, the role of antihistamines and use of EpiPen. I explained that insect venom skin testing and vaccine is available at other office is and I can refer her. She wants to wait.

## 2012-12-03 ENCOUNTER — Ambulatory Visit: Payer: Managed Care, Other (non HMO) | Admitting: Family

## 2012-12-11 ENCOUNTER — Telehealth: Payer: Self-pay | Admitting: *Deleted

## 2012-12-11 ENCOUNTER — Encounter: Payer: Self-pay | Admitting: Family

## 2012-12-11 ENCOUNTER — Ambulatory Visit (INDEPENDENT_AMBULATORY_CARE_PROVIDER_SITE_OTHER): Payer: Managed Care, Other (non HMO) | Admitting: Family

## 2012-12-11 VITALS — BP 104/66 | HR 78 | Temp 98.7°F | Resp 16 | Ht 63.0 in | Wt 156.1 lb

## 2012-12-11 DIAGNOSIS — F17200 Nicotine dependence, unspecified, uncomplicated: Secondary | ICD-10-CM

## 2012-12-11 DIAGNOSIS — I10 Essential (primary) hypertension: Secondary | ICD-10-CM

## 2012-12-11 DIAGNOSIS — E7800 Pure hypercholesterolemia, unspecified: Secondary | ICD-10-CM

## 2012-12-11 NOTE — Assessment & Plan Note (Signed)
BP stable.  Continue amlodipine. 

## 2012-12-11 NOTE — Telephone Encounter (Signed)
Completed Wellness form at pt's office visit today. Form faxed to Benita Stabile: Team [id number] @ [phone].

## 2012-12-11 NOTE — Assessment & Plan Note (Signed)
Encouraged her to work hard on quitting smoking.

## 2012-12-11 NOTE — Progress Notes (Signed)
Subjective:    Patient ID: Tiffany Gonzales, female    DOB: Apr 05, 1956, 57 y.o.   MRN: [id number]  HPI  Ms.  Gonzales is a 57 yr old female who presents today for GYN exam.  HTN-  She reports that she is feeling well on amlodipine.  Denies chest pain, swelling or shortness of breath.    Hyperlipidemia- continues simvastatin without myalgia.    Tobacco abuse- she is going to try the electronic cigarettes.   Review of Systems See HPI  Past Medical History  Diagnosis Date  . Depression   . Panic attacks   . Hypertension   . Fibroids   . Colon polyps   . Atypical chest pain 01/2007    negative stress test  . Allergy     seasonal  . Hyperlipidemia     History   Social History  . Marital Status: Single    Spouse Name: N/A    Number of Children: 2  . Years of Education: N/A   Occupational History  . 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 Smoking status: Current Every Day Smoker -- 0.3 packs/day for 20 years    Types: Cigarettes  . Smokeless tobacco: Never Used  . Alcohol Use: 0.6 oz/week    1 Glasses of wine per week     Comment: white wine  . Drug Use: No  . Sexually Active: Not on file   Other Topics Concern  . 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 Abdominal hysterectomy 1998  . Nasal sinus surgery 2007    Dr Esperanza Richters out area to help breath    Family History  Problem Relation Age of Onset  . Arthritis Mother   . Hypertension Father   . Heart disease Maternal Grandmother   . Heart disease Paternal Grandmother     Allergies  Allergen Reactions  . Iodinated Diagnostic Agents Shortness Of Breath  . Lisinopril Hives    Current Outpatient Prescriptions on File Prior to Visit  Medication Sig Dispense Refill  . amLODipine (NORVASC) 5 MG tablet Take 1 tablet (5 mg total) by mouth daily.  90  tablet  1  . aspirin 81 MG tablet Take 81 mg by mouth daily.        Marland Kitchen conjugated estrogens (PREMARIN) vaginal cream Place vaginally daily. 0.5 grams in vaginal twice weekly  30 g  1  . EPINEPHrine (EPIPEN) 0.3 mg/0.3 mL DEVI Inject 0.3 mLs (0.3 mg total) into the muscle as needed.  2 Device  1  . montelukast (SINGULAIR) 10 MG tablet Take 1 tablet (10 mg total) by mouth at bedtime.  30 tablet  11  . simvastatin (ZOCOR) 20 MG tablet Take 1 tablet (20 mg total) by mouth at bedtime.  90 tablet  1    BP 104/66  Pulse 78  Temp 98.7 F (37.1 C) (Oral)  Resp 16  Ht 5\' 3"  (1.6 m)  Wt 156 lb 1.9 oz (70.816 kg)  BMI 27.66 kg/m2  SpO2 99%       Objective:   Physical Exam  Constitutional: She is oriented to person, place, and time. She appears well-developed and well-nourished. No distress.  HENT:  Head: Normocephalic and atraumatic.  Cardiovascular: Normal rate and regular rhythm.   No murmur heard. Pulmonary/Chest: Effort normal and breath sounds normal. No respiratory distress. She  has no wheezes. She has no rales. She exhibits no tenderness.  Genitourinary:       Breasts: Examined lying and sitting.  Right: Without masses, retractions, discharge or axillary adenopathy.  Left: Without masses, retractions, discharge or axillary adenopathy.  Inguinal/mons: Normal without inguinal adenopathy  External genitalia: Normal  BUS/Urethra/Skene's glands: Normal  Bladder: Normal  Vagina: Normal  Cervix: surgically absent Uterus: surgically absent Adnexa/parametria:  Rt: Without masses or tenderness.  Lt: Without masses or tenderness.  Anus and perineum: Normal    Musculoskeletal: She exhibits no edema.  Lymphadenopathy:    She has no cervical adenopathy.  Neurological: She is alert and oriented to person, place, and time.  Skin: Skin is warm and dry.  Psychiatric: She has a normal mood and affect. Her behavior is normal. Judgment and thought content normal.          Assessment &  Plan:  Pelvic exam performed today and is normal. Mammogram up to date and pt was counseled on BSE.

## 2012-12-11 NOTE — Patient Instructions (Addendum)
Please follow up in 6 months.

## 2012-12-11 NOTE — Assessment & Plan Note (Signed)
Tolerating statin.  LDL 110. Continue same.

## 2012-12-12 ENCOUNTER — Other Ambulatory Visit: Payer: Self-pay | Admitting: Internal Medicine

## 2012-12-12 MED ORDER — MONTELUKAST SODIUM 10 MG PO TABS: 10.0000 mg | ORAL_TABLET | Freq: Every day | ORAL | Status: DC

## 2013-03-07 ENCOUNTER — Ambulatory Visit: Payer: Managed Care, Other (non HMO) | Admitting: Internal Medicine

## 2013-03-14 ENCOUNTER — Other Ambulatory Visit: Payer: Self-pay | Admitting: Internal Medicine

## 2013-05-21 ENCOUNTER — Other Ambulatory Visit: Payer: Self-pay

## 2013-05-21 DIAGNOSIS — Z1231 Encounter for screening mammogram for malignant neoplasm of breast: Secondary | ICD-10-CM

## 2013-06-04 ENCOUNTER — Ambulatory Visit: Payer: Managed Care, Other (non HMO) | Admitting: Family

## 2013-06-10 ENCOUNTER — Ambulatory Visit (INDEPENDENT_AMBULATORY_CARE_PROVIDER_SITE_OTHER): Payer: Managed Care, Other (non HMO) | Admitting: Family

## 2013-06-10 ENCOUNTER — Encounter: Payer: Self-pay | Admitting: Family

## 2013-06-10 VITALS — BP 100/74 | HR 72 | Temp 98.7°F | Resp 14 | Ht 63.0 in | Wt 155.1 lb

## 2013-06-10 DIAGNOSIS — E785 Hyperlipidemia, unspecified: Secondary | ICD-10-CM

## 2013-06-10 DIAGNOSIS — I10 Essential (primary) hypertension: Secondary | ICD-10-CM

## 2013-06-10 DIAGNOSIS — G4733 Obstructive sleep apnea (adult) (pediatric): Secondary | ICD-10-CM

## 2013-06-10 LAB — BASIC METABOLIC PANEL
Chloride: 107 mEq/L (ref 96–112)
Potassium: 4.5 mEq/L (ref 3.5–5.3)
Sodium: 143 mEq/L (ref 135–145)

## 2013-06-10 LAB — HEPATIC FUNCTION PANEL
ALT: 25 U/L (ref 0–35)
AST: 22 U/L (ref 0–37)
Bilirubin, Direct: 0.1 mg/dL (ref 0.0–0.3)
Indirect Bilirubin: 0.3 mg/dL (ref 0.0–0.9)
Total Protein: 6.7 g/dL (ref 6.0–8.3)

## 2013-06-10 LAB — LIPID PANEL
LDL Cholesterol: 96 mg/dL (ref 0–99)
VLDL: 7 mg/dL (ref 0–40)

## 2013-06-10 MED ORDER — MONTELUKAST SODIUM 10 MG PO TABS: 10.0000 mg | ORAL_TABLET | Freq: Every day | ORAL | Status: DC

## 2013-06-10 NOTE — Assessment & Plan Note (Signed)
Stable on singulair and claritin.

## 2013-06-10 NOTE — Assessment & Plan Note (Signed)
Stable on amlodipine 

## 2013-06-10 NOTE — Assessment & Plan Note (Signed)
Refer to pulmonary for evaluation of mask for CPAP- interested in trial of nasal pillows.

## 2013-06-10 NOTE — Progress Notes (Signed)
Subjective:    Patient ID: Tiffany Gonzales, female    DOB: [birth year], 57 y.o.   MRN: [id number]  HPI  Tiffany Gonzales is a 57 yr old female who presents today for follow up of multiple medical problems.  1) HTN- currently maintained on amlodipine. Reports good med compliance.  2) Allergies- uses singulair for allergic rhinitis.  Had to reschedule consult with allergist.   3) OSA- could not tolerate full face mask. Would consider nasal mask.    Review of Systems    see HPI  Past Medical History  Diagnosis Date  . Depression   . Panic attacks   . Hypertension   . Fibroids   . Colon polyps   . Atypical chest pain 01/2007    negative stress test  . Allergy     seasonal  . Hyperlipidemia     History   Social History  . Marital Status: Single    Spouse Name: N/A    Number of Children: 2  . Years of Education: N/A   Occupational History  . 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 Smoking status: Current Every Day Smoker -- 0.30 packs/day for 20 years    Types: Cigarettes  . Smokeless tobacco: Never Used  . Alcohol Use: 0.6 oz/week    1 Glasses of wine per week     Comment: white wine  . Drug Use: No  . Sexually Active: Not on file   Other Topics Concern  . 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 Abdominal hysterectomy  1998  . Nasal sinus surgery  2007    Dr Esperanza Richters out area to help breath    Family History  Problem Relation Age of Onset  . Arthritis Mother   . Hypertension Father   . Heart disease Maternal Grandmother   . Heart disease Paternal Grandmother     Allergies  Allergen Reactions  . Iodinated Diagnostic Agents Shortness Of Breath  . Lisinopril Hives    Current Outpatient Prescriptions on File Prior to Visit  Medication Sig Dispense Refill  . amLODipine (NORVASC)  5 MG tablet Take 1 tablet (5 mg total) by mouth daily.  90 tablet  1  . aspirin 81 MG tablet Take 81 mg by mouth daily.        Marland Kitchen conjugated estrogens (PREMARIN) vaginal cream Place vaginally daily. 0.5 grams in vaginal twice weekly  30 g  1  . EPINEPHrine (EPIPEN) 0.3 mg/0.3 mL DEVI Inject 0.3 mLs (0.3 mg total) into the muscle as needed.  2 Device  1  . simvastatin (ZOCOR) 20 MG tablet Take 1 tablet (20 mg total) by mouth at bedtime.  90 tablet  1   No current facility-administered medications on file prior to visit.    BP 100/74  Pulse 72  Temp(Src) 98.7 F (37.1 C) (Oral)  Resp 14  Ht 5\' 3"  (1.6 m)  Wt 155 lb 1.3 oz (70.344 kg)  BMI 27.48 kg/m2  SpO2 99%    Objective:   Physical Exam  Constitutional: She is oriented to person, place, and time. She appears well-developed and well-nourished. No distress.  HENT:  Head: Normocephalic and atraumatic.  Cardiovascular: Normal rate and regular rhythm.   No murmur heard. Pulmonary/Chest: Effort normal and breath sounds normal. No respiratory distress. She  has no wheezes. She has no rales. She exhibits no tenderness.  Musculoskeletal: She exhibits no edema.  Neurological: She is alert and oriented to person, place, and time.  Psychiatric: She has a normal mood and affect. Her behavior is normal. Judgment and thought content normal.          Assessment & Plan:

## 2013-06-10 NOTE — Patient Instructions (Addendum)
Please complete your lab work prior to leaving. Please follow up in 6 months. You will be contacted about your appointment with Dr. [name].

## 2013-06-11 ENCOUNTER — Ambulatory Visit
Admission: RE | Admit: 2013-06-11 | Discharge: 2013-06-11 | Disposition: A | Discharge status: Home / Self Care | Payer: Managed Care, Other (non HMO) | Source: Ambulatory Visit

## 2013-06-11 DIAGNOSIS — Z1231 Encounter for screening mammogram for malignant neoplasm of breast: Secondary | ICD-10-CM

## 2013-06-25 ENCOUNTER — Ambulatory Visit (INDEPENDENT_AMBULATORY_CARE_PROVIDER_SITE_OTHER): Payer: Managed Care, Other (non HMO) | Admitting: Pulmonary Disease

## 2013-06-25 ENCOUNTER — Encounter: Payer: Self-pay | Admitting: Pulmonary Disease

## 2013-06-25 VITALS — BP 108/62 | HR 74 | Temp 99.9°F | Ht 64.0 in | Wt 153.0 lb

## 2013-06-25 DIAGNOSIS — G4733 Obstructive sleep apnea (adult) (pediatric): Secondary | ICD-10-CM

## 2013-06-25 NOTE — Assessment & Plan Note (Signed)
Auto CPAP 5-12 with nasal pillows, humidity, download in 4 wks  Weight loss encouraged, compliance with goal of at least 4-6 hrs every night is the expectation. Advised against medications with sedative side effects Cautioned against driving when sleepy - understanding that sleepiness will vary on a day to day basis

## 2013-06-25 NOTE — Progress Notes (Signed)
  Subjective:    Patient ID: Tiffany Gonzales, female    DOB: 1956/03/26, 57 y.o.   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  Snoring is horrible, boyfriend & daughter complaining  Memory is affected  C/o Nocturia   Home study 2012 showed AHI 18/h with 22 central apneas & desaturations.   Saw Dr Maple Hudson dec 2013 -The combination of insect sting and lisinopril may have been enough to trigger urticaria/angioedema    06/25/2013  Last seen 10/2011. Pt stated when she had the CPAP titration study 2013 she had a panic attack d/t the mask. Pt is interested in trying nasal pillows. she does not have a CPAP machine  01/2012 -PSG showed AHI 13/h ESS 7 Off ambien, got Rx due to trouble sleeping. She has a glass of wine daily around 8pm, no excessive cafeine intake. Bedtime 10-11 pm, latency 10 mins- 1 hr, 2-3 awakenings, oob at 0630, feels tired - no dryness or headaches.   Review of Systems neg for any significant sore throat, dysphagia, itching, sneezing, nasal congestion or excess/ purulent secretions, fever, chills, sweats, unintended wt loss, pleuritic or exertional cp, hempoptysis, orthopnea pnd or change in chronic leg swelling. Also denies presyncope, palpitations, heartburn, abdominal pain, nausea, vomiting, diarrhea or change in bowel or urinary habits, dysuria,hematuria, rash, arthralgias, visual complaints, headache, numbness weakness or ataxia.     Objective:   Physical Exam  Gen. Pleasant,  in no distress ENT - no lesions, no post nasal drip Neck: No JVD, no thyromegaly, no carotid bruits Lungs: no use of accessory muscles, no dullness to percussion, decreased without rales or rhonchi  Cardiovascular: Rhythm regular, heart sounds  normal, no murmurs or gallops, no peripheral edema Musculoskeletal: No deformities, no cyanosis or clubbing , no tremors       Assessment & Plan:

## 2013-06-25 NOTE — Patient Instructions (Addendum)
Trial of CPAP with nasal pillows Send in card before your next appt

## 2013-08-06 ENCOUNTER — Ambulatory Visit: Payer: Managed Care, Other (non HMO) | Admitting: Pulmonary Disease

## 2013-08-20 ENCOUNTER — Ambulatory Visit (INDEPENDENT_AMBULATORY_CARE_PROVIDER_SITE_OTHER): Payer: Managed Care, Other (non HMO) | Admitting: Pulmonary Disease

## 2013-08-20 ENCOUNTER — Encounter: Payer: Self-pay | Admitting: Pulmonary Disease

## 2013-08-20 VITALS — BP 122/70 | HR 74 | Temp 99.3°F | Ht 64.0 in | Wt 162.0 lb

## 2013-08-20 DIAGNOSIS — G4733 Obstructive sleep apnea (adult) (pediatric): Secondary | ICD-10-CM

## 2013-08-20 NOTE — Progress Notes (Signed)
  Subjective:    Patient ID: Tiffany Gonzales, female    DOB: Apr 11, 1956, 57 y.o.   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  Home study 2012 showed AHI 18/h with 22 central apneas & desaturations.   Saw Dr Maple Hudson dec 2013 -The combination of insect sting and lisinopril may have been enough to trigger urticaria/angioedema   01/2012 -PSG showed AHI 13/h  Off ambien, got Rx due to trouble sleeping. Pt stated when she had the CPAP titration study 2013 she had a panic attack d/t the mask. Placed on nasal pillows   08/20/2013  07/31/13 download on auto 5-12 shows avg pr, AHI 1.6/h, excellent compliance Pt reports she is wearing her CPAP nightly. She is feeling rested during the day. Has no complaints.  Nasal pillows seems to work well, no dryness, pressure ok  Review of Systems neg for any significant sore throat, dysphagia, itching, sneezing, nasal congestion or excess/ purulent secretions, fever, chills, sweats, unintended wt loss, pleuritic or exertional cp, hempoptysis, orthopnea pnd or change in chronic leg swelling. Also denies presyncope, palpitations, heartburn, abdominal pain, nausea, vomiting, diarrhea or change in bowel or urinary habits, dysuria,hematuria, rash, arthralgias, visual complaints, headache, numbness weakness or ataxia.     Objective:   Physical Exam  Gen. Pleasant, well-nourished, in no distress ENT - no lesions, no post nasal drip Neck: No JVD, no thyromegaly, no carotid bruits Lungs: no use of accessory muscles, no dullness to percussion, clear without rales or rhonchi  Cardiovascular: Rhythm regular, heart sounds  normal, no murmurs or gallops, no peripheral edema Musculoskeletal: No deformities, no cyanosis or clubbing        Assessment & Plan:

## 2013-08-20 NOTE — Patient Instructions (Addendum)
You are on auto settings

## 2013-08-20 NOTE — Assessment & Plan Note (Signed)
OK to stay on auto settings Tolerating nasal pillows well. Good control of events  Weight loss encouraged, compliance with goal of at least 4-6 hrs every night is the expectation. Advised against medications with sedative side effects Cautioned against driving when sleepy - understanding that sleepiness will vary on a day to day basis

## 2013-08-21 ENCOUNTER — Ambulatory Visit: Payer: Managed Care, Other (non HMO) | Admitting: Internal Medicine

## 2013-09-20 ENCOUNTER — Other Ambulatory Visit: Payer: Self-pay | Admitting: Family

## 2013-10-14 ENCOUNTER — Ambulatory Visit: Payer: Managed Care, Other (non HMO) | Admitting: Internal Medicine

## 2013-10-18 ENCOUNTER — Ambulatory Visit (INDEPENDENT_AMBULATORY_CARE_PROVIDER_SITE_OTHER): Payer: Managed Care, Other (non HMO) | Admitting: Internal Medicine

## 2013-10-18 ENCOUNTER — Encounter: Payer: Self-pay | Admitting: Internal Medicine

## 2013-10-18 VITALS — BP 118/72 | HR 83 | Ht 64.0 in | Wt 163.0 lb

## 2013-10-18 DIAGNOSIS — F17200 Nicotine dependence, unspecified, uncomplicated: Secondary | ICD-10-CM

## 2013-10-18 DIAGNOSIS — G4733 Obstructive sleep apnea (adult) (pediatric): Secondary | ICD-10-CM

## 2013-10-18 NOTE — Progress Notes (Signed)
10/09/12- 30 yoF current smoker referred courtesy of  Tiffany Gonzales- NP; angioedema and shellfish allergy  She gives a chronic history of perennial rhinitis/hay fever since at least her teenage years, with no asthma. Much nose blowing. Shellfish- shrimp and crab-close distinct increase nasal congestion. Turbinate reduction surgery did help some in  2007, but she continues to feel too stuffy to wear CPAP.   In July 2013 she had a yellow jacket sting. 3 or 4 days later she had urticaria with angioedema of the lips and went to emergency room. Lisinopril was blamed. She was treated with Benadryl and Zyrtec. I discussed with her the possibility that the yellow jacket sting "primed" her system in conjunction with her lisinopril. She has always had "sensitive skin". She says she smokes 7 cigarettes per day. One glass of wine. Family history is negative for sleep apnea or allergies.  11/06/12-56 yoF current smoker referred courtesy of  Tiffany Gonzales- NP; angioedema and shellfish allergy  FOLLOWS FOR: currently on singulair and helping per patient; review all allergy labs in detail with patient. She has been able to stop taking Zyrtec and Benadryl. Cough is gone. She is still smoking against advice and we discussed that again today. Lab: Stinging Insect/Hymenoptera IgE elevated to all except honeybees. History of systemic reaction to yellow jacket sting Allergy Profile 10/09/2012-total IgE 131.4, elevated for apple which she eats without problem, for grass pollens and weed pollens  11/ 28/14-57 yoF smoker last seen almost a year ago. For history of angioedema and shellfish allergy. Followed by Dr. [name] for CPAP/OSA She comes today questioning allergy/season change possibly interfering with her use of CPAP. Discomfort with increased work of breathing while wearing CPAP began around mid September. She has not had a cold and she denies change in daytime symptoms including nasal congestion, drainage,  sneezing, cough or wheeze. Just at night, she feels it is somehow harder to breathe through her CPAP/ [facility]. This discomfort may have happened when her CPAP was ordered changed to change to autoPAP 5-15.   ROS-see HPI Constitutional:   No-   weight loss, night sweats, fevers, chills, fatigue, lassitude. HEENT:   No-  headaches, difficulty swallowing, tooth/dental problems, sore throat,       + sneezing, itching,  nasal congestion, post nasal drip,  CV:  No-   chest pain, orthopnea, PND, swelling in lower extremities, anasarca, dizziness, palpitations Resp: +shortness of breath with exertion or at rest.              No-   productive cough,  + non-productive cough,  No- coughing up of blood.              No-   change in color of mucus.  No- wheezing.   Skin: +  rash or lesions. GI:  No-   heartburn, indigestion, abdominal pain, nausea, vomiting,  GU:  MS:  No-   joint pain or swelling. . Neuro-     nothing unusual Psych:  No- change in mood or affect. No depression or anxiety.  No memory loss.  OBJ- Physical Exam General- Alert, Oriented, Affect-appropriate, Distress- none acute. Medium build Skin- rash-none, lesions- none, excoriation- none Lymphadenopathy- none Head- atraumatic            Eyes- Gross vision intact, PERRLA, conjunctivae and secretions clear            Ears- Hearing, canals-normal            Nose-  Clear, no-Septal dev,  polyps, erosion, perforation             Throat- Mallampati IV , mucosa clear , drainage- none, tonsils- atrophic Neck- flexible , trachea midline, no stridor , thyroid nl, carotid no bruit Chest - symmetrical excursion , unlabored           Heart/CV- RRR , no murmur , no gallop  , no rub, nl s1 s2                           - JVD- none , edema- none, stasis changes- none, varices- none           Lung- clear to P&A, wheeze- none, cough- none , dullness-none, rub- none           Chest wall-  Abd-  Br/ Gen/ Rectal- Not done, not indicated Extrem-  cyanosis- none, clubbing, none, atrophy- none, strength- nl Neuro- grossly intact to observation

## 2013-10-18 NOTE — Patient Instructions (Addendum)
Order- DME Christoper Allegra- change CPAP autotitration to 5-10 cwp  Please call Dr [name] if this isn't comfortable for you

## 2013-10-19 ENCOUNTER — Encounter: Payer: Self-pay | Admitting: Internal Medicine

## 2013-10-19 NOTE — Assessment & Plan Note (Signed)
She complains of dyspnea while wearing CPAP. If this were an allergy/asthma problem I would expect symptoms to carry over into the day, which they do not. Onset of symptoms corresponded to change to her CPAP pressure settings. Plan-[facility] to reduce CPAP auto range to 5-10,

## 2013-10-19 NOTE — Assessment & Plan Note (Signed)
We built on previous smoking cessation counseling efforts and strongly encouraged her to stop completely

## 2013-11-19 ENCOUNTER — Other Ambulatory Visit: Payer: Self-pay | Admitting: Family

## 2013-12-09 ENCOUNTER — Encounter: Payer: Self-pay | Admitting: Family

## 2013-12-09 ENCOUNTER — Ambulatory Visit (INDEPENDENT_AMBULATORY_CARE_PROVIDER_SITE_OTHER): Payer: Managed Care, Other (non HMO) | Admitting: Family

## 2013-12-09 VITALS — BP 108/80 | HR 60 | Temp 98.9°F | Resp 16 | Ht 63.0 in | Wt 162.1 lb

## 2013-12-09 DIAGNOSIS — I10 Essential (primary) hypertension: Secondary | ICD-10-CM

## 2013-12-09 DIAGNOSIS — G4733 Obstructive sleep apnea (adult) (pediatric): Secondary | ICD-10-CM

## 2013-12-09 DIAGNOSIS — E785 Hyperlipidemia, unspecified: Secondary | ICD-10-CM

## 2013-12-09 DIAGNOSIS — R413 Other amnesia: Secondary | ICD-10-CM | POA: Insufficient documentation

## 2013-12-09 DIAGNOSIS — E7800 Pure hypercholesterolemia, unspecified: Secondary | ICD-10-CM

## 2013-12-09 MED ORDER — SIMVASTATIN 20 MG PO TABS: ORAL_TABLET | ORAL | Status: DC

## 2013-12-09 NOTE — Progress Notes (Signed)
Subjective:    Patient ID: Tiffany Gonzales, female    DOB: [birth year], 58 y.o.   MRN: [id number]  Hypertension Pertinent negatives include no chest pain, headaches or shortness of breath.  Hyperlipidemia Pertinent negatives include no chest pain, myalgias or shortness of breath.   Tiffany Gonzales is a 58 year old female who presents today for follow up.  HTN) Takes amlodipine without problems. Denies chest pain and shortness of breath.  Hyperlipidemia) Patient compliant with simvastatin, denies myalgias.  Sleep Apnea) Patient reports using nasal CPAP mask pillow every night.   Allergies) Patient follows with Dr. Annamaria Boots and reports to be feeling better.  Memory Loss) Patient reports short term memory loss gradually since November 2014.  Patient reports she'll forget changes at work or cannot recall memories that her family or friends will mention. Patient also reports she'll see a movie and cannot recall the name of the actor/actress she once knew prior. Denies forgetting the location of things around the house. Patient starting taking Ginko OTC in December 2014 and reports she believes it to be helping.  Review of Systems  Constitutional: Negative for fatigue.  HENT: Negative for rhinorrhea.   Respiratory: Negative for cough and shortness of breath.   Cardiovascular: Negative for chest pain.  Musculoskeletal: Negative for myalgias.  Neurological: Negative for dizziness and headaches.       See HPI regarding memory loss.   Past Medical History  Diagnosis Date  . Depression   . Panic attacks   . Hypertension   . Fibroids   . Colon polyps   . Atypical chest pain 01/2007    negative stress test  . Allergy     seasonal  . Hyperlipidemia     History   Social History  . Marital Status: Single    Spouse Name: N/A    Number of Children: 2  . Years of Education: N/A   Occupational History  . 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 Smoking status:  Current Every Day Smoker -- 0.30 packs/day for 20 years    Types: Cigarettes  . Smokeless tobacco: Never Used     Comment: 7 cigs daily 08/20/13  . Alcohol Use: 0.6 oz/week    1 Glasses of wine per week     Comment: white wine  . Drug Use: No  . Sexual Activity: Not on file   Other Topics Concern  . 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 Abdominal hysterectomy  1998  . Nasal sinus surgery  2007    Dr Mikey Bussing out area to help breath    Family History  Problem Relation Age of Onset  . Arthritis Mother   . Hypertension Father   . Heart disease Maternal Grandmother   . Heart disease Paternal Grandmother     Allergies  Allergen Reactions  . Iodinated Diagnostic Agents Shortness Of Breath  . Lisinopril Hives    Current Outpatient Prescriptions on File Prior to Visit  Medication Sig Dispense Refill  . amLODipine (NORVASC) 5 MG tablet TAKE 1 TABLET DAILY  90 tablet  1  . aspirin 81 MG tablet Take 81 mg by mouth daily.        . cetirizine (ZYRTEC) 10 MG tablet Take 10 mg by mouth daily.      Marland Kitchen  conjugated estrogens (PREMARIN) vaginal cream Place vaginally daily. 0.5 grams in vaginal twice weekly  30 g  1  . EPINEPHrine (EPIPEN) 0.3 mg/0.3 mL DEVI Inject 0.3 mLs (0.3 mg total) into the muscle as needed.  2 Device  1  . montelukast (SINGULAIR) 10 MG tablet TAKE 1 TABLET AT BEDTIME  90 tablet  0   No current facility-administered medications on file prior to visit.    BP 108/80  Pulse 60  Temp(Src) 98.9 F (37.2 C) (Oral)  Resp 16  Ht 5\' 3"  (1.6 m)  Wt 162 lb 1.3 oz (73.519 kg)  BMI 28.72 kg/m2  SpO2 99%       Objective:   Physical Exam  Constitutional: She is oriented to person, place, and time. She appears well-nourished.  HENT:  Head: Normocephalic.  Cardiovascular: Normal rate and regular rhythm.   Pulses:       Radial pulses are 2+ on the right side, and 2+ on the left side.  Pulmonary/Chest: Effort normal and breath sounds normal. No respiratory distress.  Neurological: She is alert and oriented to person, place, and time.  Skin: Skin is warm and dry.  Psychiatric: She has a normal mood and affect. Thought content normal.    Preformed Standardized Mini-Mental State Examination and passed with a score of 30/30.      Assessment & Plan:  I have personally seen and examined patient and agree with Tiffany Ivory NP student's assessment and plan.

## 2013-12-09 NOTE — Assessment & Plan Note (Signed)
Stable. Denies concerns/complaints. Continue as directed.

## 2013-12-09 NOTE — Assessment & Plan Note (Signed)
Stable on Amlodipine. Continue as prescribed. Obtain BMET today. Follow up in six months.

## 2013-12-09 NOTE — Assessment & Plan Note (Signed)
Stable on statin. Continue. Obtain LFT's and Lipid Panel. Follow up in three months.

## 2013-12-09 NOTE — Assessment & Plan Note (Signed)
Patient scored 30/30 on Mini Mental Status Exam. Obtain B12, Folate, TSH, and RPR. Follow up in three months.

## 2013-12-09 NOTE — Progress Notes (Signed)
Pre visit review using our clinic review tool, if applicable. No additional management support is needed unless otherwise documented below in the visit note. 

## 2013-12-09 NOTE — Patient Instructions (Addendum)
Please obtain lab work prior to leaving. Continue Amlodipine and Simvastatin as prescribed. Follow up in three months.

## 2013-12-10 ENCOUNTER — Encounter: Payer: Self-pay | Admitting: Family

## 2013-12-10 LAB — BASIC METABOLIC PANEL
BUN: 11 mg/dL (ref 6–23)
CHLORIDE: 104 meq/L (ref 96–112)
CO2: 29 mEq/L (ref 19–32)
Calcium: 9.7 mg/dL (ref 8.4–10.5)
Creat: 0.72 mg/dL (ref 0.50–1.10)
Glucose, Bld: 89 mg/dL (ref 70–99)
Potassium: 4.3 mEq/L (ref 3.5–5.3)
SODIUM: 141 meq/L (ref 135–145)

## 2013-12-10 LAB — LIPID PANEL
CHOL/HDL RATIO: 3 ratio
Cholesterol: 148 mg/dL (ref 0–200)
HDL: 49 mg/dL (ref >39)
LDL Cholesterol: 89 mg/dL (ref 0–99)
Triglycerides: 48 mg/dL (ref <150)
VLDL: 10 mg/dL (ref 0–40)

## 2013-12-10 LAB — HEPATIC FUNCTION PANEL
ALBUMIN: 4.1 g/dL (ref 3.5–5.2)
ALT: 16 U/L (ref 0–35)
AST: 17 U/L (ref 0–37)
Alkaline Phosphatase: 57 U/L (ref 39–117)
BILIRUBIN DIRECT: 0.1 mg/dL (ref 0.0–0.3)
BILIRUBIN TOTAL: 0.4 mg/dL (ref 0.3–1.2)
Indirect Bilirubin: 0.3 mg/dL (ref 0.0–0.9)
Total Protein: 6.5 g/dL (ref 6.0–8.3)

## 2013-12-10 LAB — RPR

## 2013-12-10 LAB — FOLATE: Folate: 10.9 ng/mL

## 2013-12-10 LAB — VITAMIN B12: Vitamin B-12: 345 pg/mL (ref 211–911)

## 2013-12-10 LAB — TSH: TSH: 1.723 u[IU]/mL (ref 0.350–4.500)

## 2013-12-18 ENCOUNTER — Telehealth: Payer: Self-pay | Admitting: *Deleted

## 2013-12-18 NOTE — Telephone Encounter (Signed)
Wellness Program form completed and faxed to [facility] @ [phone].

## 2013-12-25 ENCOUNTER — Telehealth: Payer: Self-pay | Admitting: Family

## 2013-12-25 NOTE — Telephone Encounter (Signed)
Relevant patient education mailed to patient.  

## 2013-12-30 ENCOUNTER — Telehealth: Payer: Self-pay | Admitting: Internal Medicine

## 2013-12-30 MED ORDER — MONTELUKAST SODIUM 10 MG PO TABS: 10.0000 mg | ORAL_TABLET | Freq: Every day | ORAL | Status: DC

## 2013-12-30 NOTE — Telephone Encounter (Signed)
Pt is aware that rx has been sent in.  

## 2014-05-30 ENCOUNTER — Emergency Department (HOSPITAL_BASED_OUTPATIENT_CLINIC_OR_DEPARTMENT_OTHER)
Admission: EM | Admit: 2014-05-30 | Discharge: 2014-05-30 | Disposition: A | Discharge status: Home / Self Care | Payer: No Typology Code available for payment source | Attending: Emergency Medicine | Admitting: Emergency Medicine

## 2014-05-30 ENCOUNTER — Encounter (HOSPITAL_BASED_OUTPATIENT_CLINIC_OR_DEPARTMENT_OTHER): Payer: Self-pay | Admitting: Emergency Medicine

## 2014-05-30 DIAGNOSIS — Z860100 Personal history of colon polyps, unspecified: Secondary | ICD-10-CM | POA: Insufficient documentation

## 2014-05-30 DIAGNOSIS — Z8742 Personal history of other diseases of the female genital tract: Secondary | ICD-10-CM | POA: Insufficient documentation

## 2014-05-30 DIAGNOSIS — Z8659 Personal history of other mental and behavioral disorders: Secondary | ICD-10-CM | POA: Insufficient documentation

## 2014-05-30 DIAGNOSIS — K625 Hemorrhage of anus and rectum: Secondary | ICD-10-CM | POA: Insufficient documentation

## 2014-05-30 DIAGNOSIS — I10 Essential (primary) hypertension: Secondary | ICD-10-CM | POA: Insufficient documentation

## 2014-05-30 DIAGNOSIS — Z87891 Personal history of nicotine dependence: Secondary | ICD-10-CM | POA: Insufficient documentation

## 2014-05-30 DIAGNOSIS — E785 Hyperlipidemia, unspecified: Secondary | ICD-10-CM | POA: Insufficient documentation

## 2014-05-30 DIAGNOSIS — Z79899 Other long term (current) drug therapy: Secondary | ICD-10-CM | POA: Insufficient documentation

## 2014-05-30 DIAGNOSIS — Z7982 Long term (current) use of aspirin: Secondary | ICD-10-CM | POA: Insufficient documentation

## 2014-05-30 LAB — COMPREHENSIVE METABOLIC PANEL
ALT: 16 U/L (ref 0–35)
AST: 17 U/L (ref 0–37)
Albumin: 3.9 g/dL (ref 3.5–5.2)
Alkaline Phosphatase: 65 U/L (ref 39–117)
Anion gap: 12 (ref 5–15)
BUN: 10 mg/dL (ref 6–23)
CO2: 25 mEq/L (ref 19–32)
CREATININE: 0.8 mg/dL (ref 0.50–1.10)
Calcium: 9.7 mg/dL (ref 8.4–10.5)
Chloride: 106 mEq/L (ref 96–112)
GFR, EST NON AFRICAN AMERICAN: 79 mL/min — AB (ref >90)
GLUCOSE: 106 mg/dL — AB (ref 70–99)
Potassium: 3.8 mEq/L (ref 3.7–5.3)
SODIUM: 143 meq/L (ref 137–147)
Total Bilirubin: <0.2 mg/dL — ABNORMAL LOW (ref 0.3–1.2)
Total Protein: 6.7 g/dL (ref 6.0–8.3)

## 2014-05-30 LAB — CBC WITH DIFFERENTIAL/PLATELET
Basophils Absolute: 0 10*3/uL (ref 0.0–0.1)
Basophils Relative: 1 % (ref 0–1)
EOS PCT: 1 % (ref 0–5)
Eosinophils Absolute: 0 10*3/uL (ref 0.0–0.7)
HCT: 35.2 % — ABNORMAL LOW (ref 36.0–46.0)
HEMOGLOBIN: 13 g/dL (ref 12.0–15.0)
LYMPHS ABS: 1.6 10*3/uL (ref 0.7–4.0)
LYMPHS PCT: 41 % (ref 12–46)
MCH: 30.7 pg (ref 26.0–34.0)
MCHC: 36.9 g/dL — ABNORMAL HIGH (ref 30.0–36.0)
MCV: 83.2 fL (ref 78.0–100.0)
MONO ABS: 0.3 10*3/uL (ref 0.1–1.0)
Monocytes Relative: 7 % (ref 3–12)
Neutro Abs: 1.9 10*3/uL (ref 1.7–7.7)
Neutrophils Relative %: 50 % (ref 43–77)
Platelets: 239 10*3/uL (ref 150–400)
RBC: 4.23 MIL/uL (ref 3.87–5.11)
RDW: 13 % (ref 11.5–15.5)
WBC: 3.8 10*3/uL — AB (ref 4.0–10.5)

## 2014-05-30 LAB — OCCULT BLOOD X 1 CARD TO LAB, STOOL: Fecal Occult Bld: NEGATIVE

## 2014-05-30 MED ORDER — HYDROCORTISONE 2.5 % RE CREA: TOPICAL_CREAM | RECTAL | Status: DC

## 2014-05-30 NOTE — ED Notes (Signed)
ED PA at bedside

## 2014-05-30 NOTE — Discharge Instructions (Signed)
Bloody Stools °Bloody stools means there is blood in your poop (stool). It is a sign that there is a problem somewhere in the digestive system. It is important for your doctor to find the cause of your bleeding, so the problem can be treated.  °HOME CARE °· Only take medicine as told by your doctor. °· Eat foods with fiber (prunes, bran cereals). °· Drink enough fluids to keep your pee (urine) clear or pale yellow. °· Sit in warm water (sitz bath) for 10 to 15 minutes as told by your doctor. °· Know how to take your medicines (enemas, suppositories) if advised by your doctor. °· Watch for signs that you are getting better or getting worse. °GET HELP RIGHT AWAY IF:  °· You are not getting better. °· You start to get better but then get worse again. °· You have new problems. °· You have severe bleeding from the place where poop comes out (rectum) that does not stop. °· You throw up (vomit) blood. °· You feel weak or pass out (faint). °· You have a fever. °MAKE SURE YOU:  °· Understand these instructions. °· Will watch your condition. °· Will get help right away if you are not doing well or get worse. °Document Released: 10/26/2009 Document Revised: 01/30/2012 Document Reviewed: 03/25/2011 °ExitCare® Patient Information ©2015 [employer]. This information is not intended to replace advice given to you by your health care provider. Make sure you discuss any questions you have with your health care provider. ° °

## 2014-05-30 NOTE — ED Provider Notes (Signed)
CSN: [id number]     Arrival date & time 05/30/14  1357 History   First MD Initiated Contact with Patient 05/30/14 1418     Chief Complaint  Patient presents with  . Rectal Bleeding     (Consider location/radiation/quality/duration/timing/severity/associated sxs/prior Treatment) Patient is a 58 y.o. female presenting with hematochezia. The history is provided by the patient. No language interpreter was used.  Rectal Bleeding Quality:  Bright red Amount:  Moderate Duration:  1 day Timing:  Constant Progression:  Unchanged Chronicity:  New Context: constipation   Similar prior episodes: no   Relieved by:  Nothing Worsened by:  Nothing tried Ineffective treatments:  None tried Risk factors: no NSAID use     Past Medical History  Diagnosis Date  . Depression   . Panic attacks   . Hypertension   . Fibroids   . Colon polyps   . Atypical chest pain 01/2007    negative stress test  . Allergy     seasonal  . Hyperlipidemia    Past Surgical History  Procedure Laterality Date  . Abdominal hysterectomy  1998  . Nasal sinus surgery  2007    Dr Mikey Bussing out area to help breath   Family History  Problem Relation Age of Onset  . Arthritis Mother   . Hypertension Father   . Heart disease Maternal Grandmother   . Heart disease Paternal Grandmother    History  Substance Use Topics  . Smoking status: Former Smoker -- 0.30 packs/day for 20 years    Types: Cigarettes  . Smokeless tobacco: Never Used     Comment: 7 cigs daily 08/20/13  . Alcohol Use: 0.6 oz/week    1 Glasses of wine per week     Comment: white wine   OB History   Grav Para Term Preterm Abortions TAB SAB Ect Mult Living                 Review of Systems  Gastrointestinal: Positive for hematochezia.  All other systems reviewed and are negative.     Allergies  Iodinated diagnostic agents and Lisinopril  Home Medications   Prior to Admission medications   Medication Sig Start Date End Date  Taking? Authorizing Provider  amLODipine (NORVASC) 5 MG tablet TAKE 1 TABLET DAILY 11/19/13   Debbrah Alar, NP  aspirin 81 MG tablet Take 81 mg by mouth daily.      Historical Provider, MD  cetirizine (ZYRTEC) 10 MG tablet Take 10 mg by mouth daily.    Historical Provider, MD  EPINEPHrine (EPIPEN) 0.3 mg/0.3 mL DEVI Inject 0.3 mLs (0.3 mg total) into the muscle as needed. 05/24/12   Sharyon Cable, MD  montelukast (SINGULAIR) 10 MG tablet Take 1 tablet (10 mg total) by mouth at bedtime. 12/30/13   Deneise Lever, MD  simvastatin (ZOCOR) 20 MG tablet TAKE 1 TABLET AT BEDTIME 12/09/13   Debbrah Alar, NP   BP 121/57  Pulse 82  Temp(Src) 99 F (37.2 C) (Oral)  Resp 16  Ht 5\' 4"  (1.626 m)  SpO2 97% Physical Exam  Nursing note and vitals reviewed. Constitutional: She appears well-developed and well-nourished.  HENT:  Head: Normocephalic and atraumatic.  Neck: Normal range of motion.  Cardiovascular: Normal rate and normal heart sounds.   Pulmonary/Chest: Effort normal and breath sounds normal.  Abdominal: Soft. There is tenderness.  Genitourinary: Guaiac negative stool.  Musculoskeletal: Normal range of motion.  Neurological: She is alert.  Skin: Skin is warm.  Psychiatric: She  has a normal mood and affect.    ED Course  Procedures (including critical care time) Labs Review Labs Reviewed  OCCULT BLOOD X 1 CARD TO LAB, STOOL  CBC WITH DIFFERENTIAL  COMPREHENSIVE METABOLIC PANEL    Imaging Review No results found.   EKG Interpretation None      MDM no active bleeding,   Pt has tissue that has what appears to be a small amount of blood.   Final diagnoses:  Rectal bleeding    Pt reports eating watermelon last pm.    I doubt bleeding.   Pt advised observation.   Follow up if bleeding persist   Fransico Meadow, [place] 05/30/14 1905

## 2014-05-30 NOTE — ED Notes (Addendum)
Pt c/o " blood in stool"  X 8 hrs  Denies abd pain

## 2014-05-31 NOTE — ED Provider Notes (Signed)
Medical screening examination/treatment/procedure(s) were performed by non-physician practitioner and as supervising physician I was immediately available for consultation/collaboration.   EKG Interpretation None        Merryl Hacker, MD 05/31/14 [phone]

## 2014-09-05 ENCOUNTER — Other Ambulatory Visit: Payer: Self-pay

## 2014-12-16 ENCOUNTER — Ambulatory Visit (INDEPENDENT_AMBULATORY_CARE_PROVIDER_SITE_OTHER): Payer: 59 | Admitting: Internal Medicine

## 2014-12-16 VITALS — BP 126/68 | HR 66 | Temp 98.5°F | Resp 16 | Ht 64.0 in | Wt 150.0 lb

## 2014-12-16 DIAGNOSIS — E7800 Pure hypercholesterolemia, unspecified: Secondary | ICD-10-CM

## 2014-12-16 DIAGNOSIS — G4733 Obstructive sleep apnea (adult) (pediatric): Secondary | ICD-10-CM

## 2014-12-16 DIAGNOSIS — S82891A Other fracture of right lower leg, initial encounter for closed fracture: Secondary | ICD-10-CM

## 2014-12-16 DIAGNOSIS — I10 Essential (primary) hypertension: Secondary | ICD-10-CM

## 2014-12-16 DIAGNOSIS — M84371S Stress fracture, right ankle, sequela: Secondary | ICD-10-CM

## 2014-12-16 LAB — COMPREHENSIVE METABOLIC PANEL
ALK PHOS: 68 U/L (ref 39–117)
ALT: 20 U/L (ref 0–35)
AST: 17 U/L (ref 0–37)
Albumin: 4 g/dL (ref 3.5–5.2)
BILIRUBIN TOTAL: 0.5 mg/dL (ref 0.2–1.2)
BUN: 10 mg/dL (ref 6–23)
CHLORIDE: 107 meq/L (ref 96–112)
CO2: 27 mEq/L (ref 19–32)
Calcium: 9.1 mg/dL (ref 8.4–10.5)
Creat: 0.68 mg/dL (ref 0.50–1.10)
Glucose, Bld: 93 mg/dL (ref 70–99)
Potassium: 4.1 mEq/L (ref 3.5–5.3)
SODIUM: 141 meq/L (ref 135–145)
Total Protein: 6.4 g/dL (ref 6.0–8.3)

## 2014-12-16 LAB — LIPID PANEL
CHOLESTEROL: 149 mg/dL (ref 0–200)
HDL: 56 mg/dL (ref >39)
LDL CALC: 84 mg/dL (ref 0–99)
Total CHOL/HDL Ratio: 2.7 Ratio
Triglycerides: 43 mg/dL (ref <150)
VLDL: 9 mg/dL (ref 0–40)

## 2014-12-16 LAB — CBC WITH DIFFERENTIAL/PLATELET
Basophils Absolute: 0.1 10*3/uL (ref 0.0–0.1)
Basophils Relative: 1 % (ref 0–1)
Eosinophils Absolute: 0.1 10*3/uL (ref 0.0–0.7)
Eosinophils Relative: 1 % (ref 0–5)
HCT: 37.5 % (ref 36.0–46.0)
Hemoglobin: 12.9 g/dL (ref 12.0–15.0)
LYMPHS ABS: 2.2 10*3/uL (ref 0.7–4.0)
LYMPHS PCT: 40 % (ref 12–46)
MCH: 30.4 pg (ref 26.0–34.0)
MCHC: 34.4 g/dL (ref 30.0–36.0)
MCV: 88.4 fL (ref 78.0–100.0)
MONO ABS: 0.4 10*3/uL (ref 0.1–1.0)
MONOS PCT: 7 % (ref 3–12)
MPV: 9 fL (ref 8.6–12.4)
NEUTROS ABS: 2.8 10*3/uL (ref 1.7–7.7)
Neutrophils Relative %: 51 % (ref 43–77)
PLATELETS: 234 10*3/uL (ref 150–400)
RBC: 4.24 MIL/uL (ref 3.87–5.11)
RDW: 15.1 % (ref 11.5–15.5)
WBC: 5.4 10*3/uL (ref 4.0–10.5)

## 2014-12-16 MED ORDER — MONTELUKAST SODIUM 10 MG PO TABS: 10.0000 mg | ORAL_TABLET | Freq: Every day | ORAL | Status: DC

## 2014-12-16 MED ORDER — AMLODIPINE BESYLATE 5 MG PO TABS: 5.0000 mg | ORAL_TABLET | Freq: Every day | ORAL | Status: DC

## 2014-12-16 MED ORDER — SIMVASTATIN 20 MG PO TABS: ORAL_TABLET | ORAL | Status: DC

## 2014-12-16 NOTE — Progress Notes (Signed)
Subjective:    Patient ID: Tiffany Gonzales, female    DOB: 10/10/1956, 59 y.o.   MRN: [id number]  HPI1st [facility] OV Formally patient of FNP Tiffany Gonzales at [facility] required identifying new primary care practice --Needs refills --Recent ankle fracture  Patient Active Problem List   Diagnosis Date Noted  . Memory loss---see 12/09/13=ok 12/09/2013  . History of insect sting allergy 11/18/2012  . Post-menopause atrophic vaginitis 09/03/2012  . Allergic rhinitis-stable on Singulair  06/05/2012  . OSA (obstructive sleep apnea)--- doing well with mask  11/08/2011  . Cervical disc disease--- stable  06/28/2011  . TOBACCO ABUSE-----now a nonsmoker  03/01/2010  . HYPERCHOLESTEROLEMIA----doing well on statin with no side effects  12/09/2008  . Essential hypertension----controlled and a low dose of Norvasc  03/05/2008  . DEPRESSIVE DISORDER------ not a current problem  11/13/2007   Retired after 38 years with [employer] and now works part-time at the [employer] 5 years ago Wears glasses-Dr. Sherlean Gonzales Tetanus 5 years ago although not mentioned in medical record  Review of Systems  Constitutional: Negative for activity change, appetite change, fatigue and unexpected weight change.  HENT: Negative for dental problem and trouble swallowing.   Eyes: Negative for visual disturbance.  Respiratory: Negative for shortness of breath.   Cardiovascular: Negative for chest pain, palpitations and leg swelling.  Gastrointestinal: Negative for abdominal pain, diarrhea and constipation.  Genitourinary: Negative for difficulty urinating and pelvic pain.  Musculoskeletal: Negative for arthralgias and gait problem.  Neurological: Negative for dizziness, weakness and headaches.  Psychiatric/Behavioral: Negative for behavioral problems and decreased concentration.       Objective:   Physical Exam BP 126/68 mmHg  Pulse 66  Temp(Src) 98.5 F (36.9 C) (Oral)  Resp 16  Ht 5\' 4"   (1.626 m)  Wt 150 lb (68.04 kg)  BMI 25.73 kg/m2  SpO2 99% HEENT clear No thyromegaly or lymphadenopathy Heart regular without murmur Lungs clear No peripheral edema Right ankle is in a stirrup splint Neuro intact Mood good and affect appropriate      Assessment & Plan:  Problem #1 ankle fracture-refer to orthopedics Problem #2 hypertension Problem #3 hyperlipidemia Problem #4 allergic rhinitis Problem #5 obstructive sleep apnea  Meds ordered this encounter  Medications  . montelukast (SINGULAIR) 10 MG tablet    Sig: Take 1 tablet (10 mg total) by mouth at bedtime.    Dispense:  90 tablet    Refill:  3  . amLODipine (NORVASC) 5 MG tablet    Sig: Take 1 tablet (5 mg total) by mouth daily.    Dispense:  90 tablet    Refill:  3  . simvastatin (ZOCOR) 20 MG tablet    Sig: TAKE 1 TABLET AT BEDTIME    Dispense:  90 tablet    Refill:  3  . DISCONTD: amLODipine (NORVASC) 5 MG tablet    Sig: Take 1 tablet (5 mg total) by mouth daily.    Dispense:  30 tablet    Refill:  0  . DISCONTD: montelukast (SINGULAIR) 10 MG tablet    Sig: Take 1 tablet (10 mg total) by mouth at bedtime.    Dispense:  30 tablet    Refill:  0  . montelukast (SINGULAIR) 10 MG tablet    Sig: Take 1 tablet (10 mg total) by mouth at bedtime.    Dispense:  30 tablet    Refill:  0  . amLODipine (NORVASC) 5 MG tablet    Sig: Take 1 tablet (  5 mg total) by mouth daily.    Dispense:  30 tablet    Refill:  0   prescriptions include mail-order had a 30 day supply for current use because she is out May schedule appointment for preventive care with FNP Tiffany Gonzales Routine labs done

## 2014-12-16 NOTE — Patient Instructions (Signed)
Call for appointment--ask for appt with FNP Tor Netters for routine care

## 2014-12-19 ENCOUNTER — Encounter: Payer: Self-pay | Admitting: Internal Medicine

## 2014-12-22 ENCOUNTER — Telehealth: Payer: Self-pay

## 2014-12-22 NOTE — Telephone Encounter (Signed)
Patient needs a note for her to work with restrictions no weight on ankle.   Employer Note     [phone]

## 2014-12-22 NOTE — Telephone Encounter (Signed)
[place] for note Dr. Laney Pastor?

## 2014-12-24 NOTE — Telephone Encounter (Signed)
sure

## 2014-12-24 NOTE — Telephone Encounter (Signed)
Letter has been written/ Pt notified- in pick up drawer.

## 2014-12-31 ENCOUNTER — Other Ambulatory Visit: Payer: Self-pay

## 2014-12-31 NOTE — Telephone Encounter (Signed)
Pt is calling to get a refill on cetirizine (ZYRTEC) 10 MG tablet [[id number]] and simvastatin (ZOCOR) 20 MG tablet [[id number]] . Please advise at [phone]

## 2015-01-01 MED ORDER — CETIRIZINE HCL 10 MG PO TABS: 10.0000 mg | ORAL_TABLET | Freq: Every day | ORAL | Status: DC

## 2015-01-01 NOTE — Telephone Encounter (Signed)
[facility] should have RFs on pt's simvastatin sent on 12/16/14. Dr Laney Pastor, you saw pt for AR on that date, but I don't see that we have Rxd zyrtec for pt bf and not on his current med list at that visit. Do you want to Rx it? I will pend for review.

## 2015-01-08 ENCOUNTER — Ambulatory Visit (INDEPENDENT_AMBULATORY_CARE_PROVIDER_SITE_OTHER): Payer: 59 | Admitting: Emergency Medicine

## 2015-01-08 VITALS — BP 112/78 | HR 75 | Temp 98.5°F | Resp 18 | Ht 64.25 in | Wt 156.0 lb

## 2015-01-08 DIAGNOSIS — I10 Essential (primary) hypertension: Secondary | ICD-10-CM

## 2015-01-08 DIAGNOSIS — E7800 Pure hypercholesterolemia, unspecified: Secondary | ICD-10-CM

## 2015-01-08 DIAGNOSIS — L03012 Cellulitis of left finger: Secondary | ICD-10-CM

## 2015-01-08 DIAGNOSIS — S82891A Other fracture of right lower leg, initial encounter for closed fracture: Secondary | ICD-10-CM

## 2015-01-08 MED ORDER — AMLODIPINE BESYLATE 5 MG PO TABS: 5.0000 mg | ORAL_TABLET | Freq: Every day | ORAL | Status: DC

## 2015-01-08 MED ORDER — SULFAMETHOXAZOLE-TRIMETHOPRIM 800-160 MG PO TABS: 1.0000 | ORAL_TABLET | Freq: Two times a day (BID) | ORAL | Status: DC

## 2015-01-08 MED ORDER — SIMVASTATIN 20 MG PO TABS: ORAL_TABLET | ORAL | Status: DC

## 2015-01-08 MED ORDER — MONTELUKAST SODIUM 10 MG PO TABS: 10.0000 mg | ORAL_TABLET | Freq: Every day | ORAL | Status: DC

## 2015-01-08 MED ORDER — CETIRIZINE HCL 10 MG PO TABS: 10.0000 mg | ORAL_TABLET | Freq: Every day | ORAL | Status: DC

## 2015-01-08 NOTE — Progress Notes (Signed)
[facility] [address] [place] [address] [phone]  Date:  01/08/2015   Name:  Tiffany Gonzales [facility]   DOB:  03/25/1956   MRN:  [id number]  PCP:  Leandrew Koyanagi, MD    Chief Complaint: Medication Refill and Ingrown Toenail   History of Present Illness:  Tiffany Gonzales is a 59 y.o. very pleasant female patient who presents with the following:  Has pain in left great toe after rolling her walker over it.  Thinks it is ingrown This occurred about 2 weeks ago. Needs refills on her meds.  Had labs a month ago. No improvement with over the counter medications or other home remedies.  Denies other complaint or health concern today.   Patient Active Problem List   Diagnosis Date Noted  . Memory loss 12/09/2013  . History of insect sting allergy 11/18/2012  . Post-menopause atrophic vaginitis 09/03/2012  . Allergic reaction 06/05/2012  . OSA (obstructive sleep apnea) 11/08/2011  . Cervical disc disease 06/28/2011  . TOBACCO ABUSE 03/01/2010  . LEUKOPENIA, MILD 12/10/2008  . HYPERCHOLESTEROLEMIA 12/09/2008  . Essential hypertension 03/05/2008  . DEPRESSIVE DISORDER 11/13/2007    Past Medical History  Diagnosis Date  . Depression   . Panic attacks   . Hypertension   . Fibroids   . Colon polyps   . Atypical chest pain 01/2007    negative stress test  . Allergy     seasonal  . Hyperlipidemia     Past Surgical History  Procedure Laterality Date  . Abdominal hysterectomy  1998  . Nasal sinus surgery  2007    Dr Mikey Bussing out area to help breath    History  Substance Use Topics  . Smoking status: Former Smoker -- 0.30 packs/day for 20 years    Types: Cigarettes  . Smokeless tobacco: Never Used     Comment: 7 cigs daily 08/20/13  . Alcohol Use: 0.6 oz/week    1 Glasses of wine per week     Comment: white wine    Family History  Problem Relation Age of Onset  . Arthritis Mother   . Hypertension Father   . Heart disease  Maternal Grandmother   . Heart disease Paternal Grandmother     Allergies  Allergen Reactions  . Iodinated Diagnostic Agents Shortness Of Breath  . Lisinopril Hives    Medication list has been reviewed and updated.  Current Outpatient Prescriptions on File Prior to Visit  Medication Sig Dispense Refill  . amLODipine (NORVASC) 5 MG tablet Take 1 tablet (5 mg total) by mouth daily. 90 tablet 3  . amLODipine (NORVASC) 5 MG tablet Take 1 tablet (5 mg total) by mouth daily. 30 tablet 0  . aspirin 81 MG tablet Take 81 mg by mouth daily.      . cetirizine (ZYRTEC) 10 MG tablet Take 1 tablet (10 mg total) by mouth daily. 90 tablet 1  . EPINEPHrine (EPIPEN) 0.3 mg/0.3 mL DEVI Inject 0.3 mLs (0.3 mg total) into the muscle as needed. 2 Device 1  . montelukast (SINGULAIR) 10 MG tablet Take 1 tablet (10 mg total) by mouth at bedtime. 90 tablet 3  . montelukast (SINGULAIR) 10 MG tablet Take 1 tablet (10 mg total) by mouth at bedtime. 30 tablet 0  . simvastatin (ZOCOR) 20 MG tablet TAKE 1 TABLET AT BEDTIME 90 tablet 3  . hydrocortisone (ANUSOL-HC) 2.5 % rectal cream Apply rectally 2 times daily (Patient not taking: Reported on 01/08/2015) 30  g 0   No current facility-administered medications on file prior to visit.    Review of Systems:  As per HPI, otherwise negative.    Physical Examination: Filed Vitals:   01/08/15 1350  BP: 112/78  Pulse: 75  Temp: 98.5 F (36.9 C)  Resp: 18   Filed Vitals:   01/08/15 1350  Height: 5' 4.25" (1.632 m)  Weight: 156 lb (70.761 kg)   Body mass index is 26.57 kg/(m^2). Ideal Body Weight: Weight in (lb) to have BMI = 25: 146.5   GEN: WDWN, NAD, Non-toxic, Alert & Oriented x 3 HEENT: Atraumatic, Normocephalic.  Ears and Nose: No external deformity. EXTR: No clubbing/cyanosis/edema NEURO: Normal gait.  PSYCH: Normally interactive. Conversant. Not depressed or anxious appearing.  Calm demeanor.  LEFT great toe tender with paronychia   Assessment  and Plan: Paronychia Septra Refill meds Follow up with Dr Laney Pastor  Signed,  Ellison Carwin, MD

## 2015-01-08 NOTE — Patient Instructions (Signed)

## 2015-01-08 NOTE — Addendum Note (Signed)
Addended by: Roselee Culver on: 01/08/2015 02:20 PM   Modules accepted: Orders

## 2015-03-14 ENCOUNTER — Ambulatory Visit (INDEPENDENT_AMBULATORY_CARE_PROVIDER_SITE_OTHER): Payer: 59 | Admitting: Internal Medicine

## 2015-03-14 VITALS — BP 116/82 | HR 79 | Temp 99.2°F | Resp 18 | Ht 64.0 in | Wt 152.0 lb

## 2015-03-14 DIAGNOSIS — R509 Fever, unspecified: Secondary | ICD-10-CM

## 2015-03-14 LAB — POCT CBC
Granulocyte percent: 49.8 %G (ref 37–80)
HCT, POC: 38.9 % (ref 37.7–47.9)
HEMOGLOBIN: 13 g/dL (ref 12.2–16.2)
Lymph, poc: 1.5 (ref 0.6–3.4)
MCH, POC: 30 pg (ref 27–31.2)
MCHC: 33.5 g/dL (ref 31.8–35.4)
MCV: 89.3 fL (ref 80–97)
MID (CBC): 0.2 (ref 0–0.9)
MPV: 6.5 fL (ref 0–99.8)
PLATELET COUNT, POC: 235 10*3/uL (ref 142–424)
POC Granulocyte: 1.7 — AB (ref 2–6.9)
POC LYMPH PERCENT: 43.4 %L (ref 10–50)
POC MID %: 6.8 % (ref 0–12)
RBC: 4.35 M/uL (ref 4.04–5.48)
RDW, POC: 14.5 %
WBC: 3.4 10*3/uL — AB (ref 4.6–10.2)

## 2015-03-14 MED ORDER — OSELTAMIVIR PHOSPHATE 75 MG PO CAPS: 75.0000 mg | ORAL_CAPSULE | Freq: Two times a day (BID) | ORAL | Status: DC

## 2015-03-14 MED ORDER — HYDROCODONE-HOMATROPINE 5-1.5 MG/5ML PO SYRP: 5.0000 mL | ORAL_SOLUTION | Freq: Four times a day (QID) | ORAL | Status: DC | PRN

## 2015-03-14 NOTE — Progress Notes (Signed)
Subjective:  This chart was scribed for Tiffany Lin, MD by Tiffany Gonzales, Medial Scribe. This patient was seen in room 2 and the patient's care was started at 10:04 AM.    Patient ID: Tiffany Gonzales, female    DOB: 1956-07-07, 59 y.o.   MRN: [id number] Chief Complaint  Patient presents with  . Cough  . Chills    HPI HPI Comments: Tiffany Gonzales is a 59 y.o. female who presents to the [facility] complaining of URI symptoms including productive cough, chills, congestion, sore throat and chest pain from persistent coughing and fever onset two days ago. Patient reports fever measured at 100.8 last night. Patient reports treatment with DayQuil.    Patient Active Problem List   Diagnosis Date Noted  . Memory loss 12/09/2013  . History of insect sting allergy 11/18/2012  . Post-menopause atrophic vaginitis 09/03/2012  . Allergic reaction 06/05/2012  . OSA (obstructive sleep apnea) 11/08/2011  . Cervical disc disease 06/28/2011  . TOBACCO ABUSE 03/01/2010  . LEUKOPENIA, MILD 12/10/2008  . HYPERCHOLESTEROLEMIA 12/09/2008  . Essential hypertension 03/05/2008  . DEPRESSIVE DISORDER 11/13/2007   Past Medical History  Diagnosis Date  . Depression   . Panic attacks   . Hypertension   . Fibroids   . Colon polyps   . Atypical chest pain 01/2007    negative stress test  . Allergy     seasonal  . Hyperlipidemia    Past Surgical History  Procedure Laterality Date  . Abdominal hysterectomy  1998  . Nasal sinus surgery  2007    Dr Mikey Bussing out area to help breath   Allergies  Allergen Reactions  . Iodinated Diagnostic Agents Shortness Of Breath  . Lisinopril Hives   Prior to Admission medications   Medication Sig Start Date End Date Taking? Authorizing Provider  amLODipine (NORVASC) 5 MG tablet Take 1 tablet (5 mg total) by mouth daily. 12/16/14   Leandrew Koyanagi, MD  amLODipine (NORVASC) 5 MG tablet Take 1 tablet (5 mg total) by mouth  daily. 01/08/15   Roselee Culver, MD  aspirin 81 MG tablet Take 81 mg by mouth daily.      Historical Provider, MD  cetirizine (ZYRTEC) 10 MG tablet Take 1 tablet (10 mg total) by mouth daily. 01/08/15   Roselee Culver, MD  EPINEPHrine (EPIPEN) 0.3 mg/0.3 mL DEVI Inject 0.3 mLs (0.3 mg total) into the muscle as needed. 05/24/12   Ripley Fraise, MD  hydrocortisone (ANUSOL-HC) 2.5 % rectal cream Apply rectally 2 times daily Patient not taking: Reported on 01/08/2015 05/30/14   Fransico Meadow, PA-C  montelukast (SINGULAIR) 10 MG tablet Take 1 tablet (10 mg total) by mouth at bedtime. 12/16/14   Leandrew Koyanagi, MD  montelukast (SINGULAIR) 10 MG tablet Take 1 tablet (10 mg total) by mouth at bedtime. 01/08/15   Roselee Culver, MD  simvastatin (ZOCOR) 20 MG tablet TAKE 1 TABLET AT BEDTIME 01/08/15   Roselee Culver, MD  sulfamethoxazole-trimethoprim (BACTRIM DS,SEPTRA DS) 800-160 MG per tablet Take 1 tablet by mouth 2 (two) times daily. 01/08/15   Roselee Culver, MD   History   Social History  . Marital Status: Single    Spouse Name: N/A  . Number of Children: 2  . Years of Education: N/A   Occupational History  . Litigation Assessor [place]   Social History Main Topics  . Smoking status: Former Smoker -- 0.30 packs/day for 20 years  Types: Cigarettes  . Smokeless tobacco: Never Used     Comment: 7 cigs daily 08/20/13  . Alcohol Use: 0.6 oz/week    1 Glasses of wine per week     Comment: white wine  . Drug Use: No  . Sexual Activity: Yes    Birth Control/ Protection: Surgical   Other Topics Concern  . 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  HENT: Positive for congestion and sore throat.   Respiratory: Positive for cough. Negative for shortness of breath and wheezing.   Gastrointestinal: Negative for nausea.        Objective:   Physical Exam  Constitutional: She is oriented to person, place, and time. She appears well-developed and well-nourished. No distress.  HENT:  Head: Normocephalic and atraumatic.  Nose: Nose normal.  Throat non erythematous.   Eyes: Conjunctivae and EOM are normal.  Conjunctiva non injected.   Neck: Neck supple. No tracheal deviation present.  Cardiovascular: Normal rate.   Pulmonary/Chest: Effort normal. No respiratory distress. She has no wheezes.  Musculoskeletal: Normal range of motion.  Lymphadenopathy:    She has no cervical adenopathy.  Neurological: She is alert and oriented to person, place, and time.  Skin: Skin is warm and dry.  Psychiatric: She has a normal mood and affect. Her behavior is normal.  Nursing note and vitals reviewed.    Filed Vitals:   03/14/15 0913  BP: 116/82  Pulse: 79  Temp: 99.2 F (37.3 C)  TempSrc: Oral  Resp: 18  Height: 5\' 4"  (1.626 m)  Weight: 152 lb (68.947 kg)  SpO2: 97%   Results for orders placed or performed in visit on 03/14/15  POCT CBC  Result Value Ref Range   WBC 3.4 (A) 4.6 - 10.2 K/uL   Lymph, poc 1.5 0.6 - 3.4   POC LYMPH PERCENT 43.4 10 - 50 %L   MID (cbc) 0.2 0 - 0.9   POC MID % 6.8 0 - 12 %M   POC Granulocyte 1.7 (A) 2 - 6.9   Granulocyte percent 49.8 37 - 80 %G   RBC 4.35 4.04 - 5.48 M/uL   Hemoglobin 13.0 12.2 - 16.2 g/dL   HCT, POC 38.9 37.7 - 47.9 %   MCV 89.3 80 - 97 fL   MCH, POC 30.0 27 - 31.2 pg   MCHC 33.5 31.8 - 35.4 g/dL   RDW, POC 14.5 %   Platelet Count, POC 235 142 - 424 K/uL   MPV 6.5 0 - 99.8 fL        Assessment & Plan:  Fever, unspecified fever cause - Plan: POCT CBC  acute influenza suspected Meds ordered this encounter  Medications  . oseltamivir (TAMIFLU) 75 MG capsule    Sig: Take 1 capsule (75 mg total) by mouth 2 (two) times daily.    Dispense:  10 capsule    Refill:  0  . HYDROcodone-homatropine (HYCODAN) 5-1.5 MG/5ML syrup    Sig: Take 5 mLs by mouth  every 6 (six) hours as needed.    Dispense:  120 mL    Refill:  0   Tylenol as needed  I have completed the patient encounter in its entirety as documented by the scribe, with editing by me where necessary. Annis Lagoy P. Laney Pastor, M.D.

## 2015-04-02 ENCOUNTER — Telehealth: Payer: Self-pay

## 2015-04-02 NOTE — Telephone Encounter (Signed)
Does pt need to RTC first before referral. I do not see this in her problem list.

## 2015-04-02 NOTE — Telephone Encounter (Signed)
Patient wants a referral to an ophthalmologist for cataract surgery.

## 2015-04-03 NOTE — Telephone Encounter (Signed)
You can can send referral.

## 2015-04-04 ENCOUNTER — Other Ambulatory Visit: Payer: Self-pay | Admitting: Radiology

## 2015-04-04 DIAGNOSIS — H269 Unspecified cataract: Secondary | ICD-10-CM

## 2015-04-04 NOTE — Telephone Encounter (Signed)
Will send referral

## 2015-04-08 ENCOUNTER — Ambulatory Visit (INDEPENDENT_AMBULATORY_CARE_PROVIDER_SITE_OTHER): Payer: 59 | Admitting: Family Medicine

## 2015-04-08 VITALS — BP 114/60 | HR 74 | Temp 98.2°F | Resp 16 | Ht 64.0 in | Wt 155.6 lb

## 2015-04-08 DIAGNOSIS — N61 Inflammatory disorders of breast: Secondary | ICD-10-CM | POA: Diagnosis not present

## 2015-04-08 DIAGNOSIS — N611 Abscess of the breast and nipple: Secondary | ICD-10-CM | POA: Insufficient documentation

## 2015-04-08 MED ORDER — DOXYCYCLINE HYCLATE 100 MG PO CAPS: 100.0000 mg | ORAL_CAPSULE | Freq: Two times a day (BID) | ORAL | Status: DC

## 2015-04-08 NOTE — Patient Instructions (Signed)
Incision and Drainage Incision and drainage is a procedure in which a sac-like structure (cystic structure) is opened and drained. The area to be drained usually contains material such as pus, fluid, or blood.  LET YOUR CAREGIVER KNOW ABOUT:   Allergies to medicine.  Medicines taken, including vitamins, herbs, eyedrops, over-the-counter medicines, and creams.  Use of steroids (by mouth or creams).  Previous problems with anesthetics or numbing medicines.  History of bleeding problems or blood clots.  Previous surgery.  Other health problems, including diabetes and kidney problems.  Possibility of pregnancy, if this applies. RISKS AND COMPLICATIONS  Pain.  Bleeding.  Scarring.  Infection. BEFORE THE PROCEDURE  You may need to have an ultrasound or other imaging tests to see how large or deep your cystic structure is. Blood tests may also be used to determine if you have an infection or how severe the infection is. You may need to have a tetanus shot. PROCEDURE  The affected area is cleaned with a cleaning fluid. The cyst area will then be numbed with a medicine (local anesthetic). A small incision will be made in the cystic structure. A syringe or catheter may be used to drain the contents of the cystic structure, or the contents may be squeezed out. The area will then be flushed with a cleansing solution. After cleansing the area, it is often gently packed with a gauze or another wound dressing. Once it is packed, it will be covered with gauze and tape or some other type of wound dressing. AFTER THE PROCEDURE   Often, you will be allowed to go home right after the procedure.  You may be given antibiotic medicine to prevent or heal an infection.  If the area was packed with gauze or some other wound dressing, you will likely need to come back in 1 to 2 days to get it removed.  The area should heal in about 14 days. Document Released: 05/03/2001 Document Revised: 05/08/2012  Document Reviewed: 01/02/2012 [facility] Patient Information 2015 [place], [place]. This information is not intended to replace advice given to you by your health care provider. Make sure you discuss any questions you have with your health care provider.    Abscess An abscess is an infected area that contains a collection of pus and debris.It can occur in almost any part of the body. An abscess is also known as a furuncle or boil. CAUSES  An abscess occurs when tissue gets infected. This can occur from blockage of oil or sweat glands, infection of hair follicles, or a minor injury to the skin. As the body tries to fight the infection, pus collects in the area and creates pressure under the skin. This pressure causes pain. People with weakened immune systems have difficulty fighting infections and get certain abscesses more often.  SYMPTOMS Usually an abscess develops on the skin and becomes a painful mass that is red, warm, and tender. If the abscess forms under the skin, you may feel a moveable soft area under the skin. Some abscesses break open (rupture) on their own, but most will continue to get worse without care. The infection can spread deeper into the body and eventually into the bloodstream, causing you to feel ill.  DIAGNOSIS  Your caregiver will take your medical history and perform a physical exam. A sample of fluid may also be taken from the abscess to determine what is causing your infection. TREATMENT  Your caregiver may prescribe antibiotic medicines to fight the infection. However, taking antibiotics  alone usually does not cure an abscess. Your caregiver may need to make a small cut (incision) in the abscess to drain the pus. In some cases, gauze is packed into the abscess to reduce pain and to continue draining the area. HOME CARE INSTRUCTIONS   Only take over-the-counter or prescription medicines for pain, discomfort, or fever as directed by your caregiver.  If you were prescribed  antibiotics, take them as directed. Finish them even if you start to feel better.  If gauze is used, follow your caregiver's directions for changing the gauze.  To avoid spreading the infection:  Keep your draining abscess covered with a bandage.  Wash your hands well.  Do not share personal care items, towels, or whirlpools with others.  Avoid skin contact with others.  Keep your skin and clothes clean around the abscess.  Keep all follow-up appointments as directed by your caregiver. SEEK MEDICAL CARE IF:   You have increased pain, swelling, redness, fluid drainage, or bleeding.  You have muscle aches, chills, or a general ill feeling.  You have a fever. MAKE SURE YOU:   Understand these instructions.  Will watch your condition.  Will get help right away if you are not doing well or get worse. Document Released: 08/17/2005 Document Revised: 05/08/2012 Document Reviewed: 01/20/2012 [facility] Patient Information 2015 [place], [place]. This information is not intended to replace advice given to you by your health care provider. Make sure you discuss any questions you have with your health care provider.

## 2015-04-08 NOTE — Progress Notes (Signed)
    MRN: [id number] DOB: 06/21/1956  Subjective:   Tiffany Gonzales is a 59 y.o. female presenting for chief complaint of Breast Mass  Reports 1 day history of breast lump and redness. States that she noticed it last night, felt painful and hard, felt redness spread this morning. She has not tried any medications relief. Denies fevers, drainage of pus or blood, nipple discharge, previous breast mass, history of breast cancer. Denies any other aggravating or relieving factors, no other questions or concerns.  Tiffany Gonzales has a current medication list which includes the following prescription(s): amlodipine, amlodipine, aspirin, cetirizine, epinephrine, montelukast, montelukast, and simvastatin. She is allergic to iodinated diagnostic agents and lisinopril.  Tiffany Gonzales  has a past medical history of Depression; Panic attacks; Hypertension; Fibroids; Colon polyps; Atypical chest pain (01/2007); Allergy; and Hyperlipidemia. Also  has past surgical history that includes Abdominal hysterectomy (1998) and Nasal sinus surgery (2007).  ROS As in subjective.  Objective:   Vitals: BP 114/60 mmHg  Pulse 74  Temp(Src) 98.2 F (36.8 C)  Resp 16  Ht 5\' 4"  (1.626 m)  Wt 155 lb 9.6 oz (70.58 kg)  BMI 26.70 kg/m2  SpO2 96%  Physical Exam  Constitutional: She is oriented to person, place, and time. She appears well-developed and well-nourished.  Cardiovascular: Normal rate.   Pulmonary/Chest: Effort normal.    Neurological: She is alert and oriented to person, place, and time.  Skin: Skin is warm and dry. No rash noted. There is erythema (as depicted). No pallor.   PROCEDURE NOTE: I&D of Abscess Verbal consent obtained. Local anesthesia with 1cc of 2% lidocaine plain. Site cleansed with Betadine.  Incision of 1cm was made using a 11 blade, discharge some pus but mostly sangeuinous fluid. Wound cavity was explored with curved hemostats, wound cavity was ~1.5cm, aggressively packed with 1/4" plain  packing. Cleansed and dressed. After care instructions provided. Patient to return to clinic on 04/10/2015 for reevaluation/repacking.  Assessment and Plan :   1. Breast abscess - Start doxycycline, I&D performed with packing, return to clinic in 2 days for re-evaluation. Wound care instructions reviewed and copy provided.  Jaynee Eagles, PA-C [facility] [phone] 04/08/2015 4:11 PM

## 2015-04-10 ENCOUNTER — Ambulatory Visit (INDEPENDENT_AMBULATORY_CARE_PROVIDER_SITE_OTHER): Payer: 59 | Admitting: Urgent Care

## 2015-04-10 VITALS — BP 112/74 | HR 79 | Temp 99.1°F | Resp 16

## 2015-04-10 DIAGNOSIS — N61 Inflammatory disorders of breast: Secondary | ICD-10-CM | POA: Diagnosis not present

## 2015-04-10 DIAGNOSIS — N611 Abscess of the breast and nipple: Secondary | ICD-10-CM

## 2015-04-10 DIAGNOSIS — Z5189 Encounter for other specified aftercare: Secondary | ICD-10-CM | POA: Diagnosis not present

## 2015-04-10 NOTE — Progress Notes (Signed)
    MRN: [id number] DOB: 09-17-1956  Subjective:   Tiffany Gonzales is a 59 y.o. female presenting for follow up on breast abscess and wound care. Reports that she has had itching around wound. Denies fevers, increasing pain, redness, spontaneous drainage of pus or bleeding, swelling. She has been taking doxycycline as prescribed. Denies any other aggravating or relieving factors, no other questions or concerns.  Tiffany Gonzales has a current medication list which includes the following prescription(s): amlodipine, amlodipine, aspirin, cetirizine, doxycycline, epinephrine, montelukast, montelukast, and simvastatin. She is allergic to iodinated diagnostic agents and lisinopril.  Tiffany Gonzales  has a past medical history of Depression; Panic attacks; Hypertension; Fibroids; Colon polyps; Atypical chest pain (01/2007); Allergy; and Hyperlipidemia. Also  has past surgical history that includes Abdominal hysterectomy (1998) and Nasal sinus surgery (2007).  ROS As in subjective.  Objective:   Vitals: BP 112/74 mmHg  Pulse 79  Temp(Src) 99.1 F (37.3 C)  Resp 16  SpO2 95%  Physical Exam  Constitutional: She is oriented to person, place, and time. She appears well-developed and well-nourished.  Cardiovascular: Normal rate.   Pulmonary/Chest: Effort normal.    Neurological: She is alert and oriented to person, place, and time.  Skin: Skin is warm and dry. No rash noted. No erythema. No pallor.   WOUND CARE Packing removed, irrigated with ~10cc of 2% lidocaine. There was drainage of slight pus and sanguineous fluid expressed. Area is nontender. Repacked loosely with 1/4" packing. Cleansed and dressed.  Assessment and Plan :   1. Breast abscess 2. Encounter for wound care - Continue doxycycline, reviewed wound care. Recommended patient pull out and remove ~1cm of packing and redress wound on 04/12/2015. Return to clinic on 04/14/2015 for re-evaluation, consider no repacking, dressing and  anticipatory guidance at that point. Patient agreed.  Tiffany Eagles, PA-C [facility] [phone] 04/10/2015 5:33 PM

## 2015-04-10 NOTE — Patient Instructions (Signed)
-   On Sunday, Apr 12, 2015, carefully pull out ~1cm of packing, cut that end and secure it back with medical tape. Recover the wound with dressing and tape. If the packing falls out completely, do not reinsert the packing. Simply cover the wound and return to clinic on Sunday if there is a lot of pus or bleeding. Otherwise, return to our clinic on Tuesday, Apr 14, 2015 for re-evaluation.    Incision and Drainage Incision and drainage is a procedure in which a sac-like structure (cystic structure) is opened and drained. The area to be drained usually contains material such as pus, fluid, or blood.  LET YOUR CAREGIVER KNOW ABOUT:   Allergies to medicine.  Medicines taken, including vitamins, herbs, eyedrops, over-the-counter medicines, and creams.  Use of steroids (by mouth or creams).  Previous problems with anesthetics or numbing medicines.  History of bleeding problems or blood clots.  Previous surgery.  Other health problems, including diabetes and kidney problems.  Possibility of pregnancy, if this applies. RISKS AND COMPLICATIONS  Pain.  Bleeding.  Scarring.  Infection. BEFORE THE PROCEDURE  You may need to have an ultrasound or other imaging tests to see how large or deep your cystic structure is. Blood tests may also be used to determine if you have an infection or how severe the infection is. You may need to have a tetanus shot. PROCEDURE  The affected area is cleaned with a cleaning fluid. The cyst area will then be numbed with a medicine (local anesthetic). A small incision will be made in the cystic structure. A syringe or catheter may be used to drain the contents of the cystic structure, or the contents may be squeezed out. The area will then be flushed with a cleansing solution. After cleansing the area, it is often gently packed with a gauze or another wound dressing. Once it is packed, it will be covered with gauze and tape or some other type of wound  dressing. AFTER THE PROCEDURE   Often, you will be allowed to go home right after the procedure.  You may be given antibiotic medicine to prevent or heal an infection.  If the area was packed with gauze or some other wound dressing, you will likely need to come back in 1 to 2 days to get it removed.  The area should heal in about 14 days. Document Released: 05/03/2001 Document Revised: 05/08/2012 Document Reviewed: 01/02/2012 [facility] Patient Information 2015 [place], [place]. This information is not intended to replace advice given to you by your health care provider. Make sure you discuss any questions you have with your health care provider.

## 2015-04-11 NOTE — Progress Notes (Signed)
make sure we follow her up to 100% improvement

## 2015-04-14 ENCOUNTER — Ambulatory Visit (INDEPENDENT_AMBULATORY_CARE_PROVIDER_SITE_OTHER): Payer: 59 | Admitting: Urgent Care

## 2015-04-14 VITALS — BP 122/76 | HR 73 | Temp 99.7°F | Resp 16

## 2015-04-14 DIAGNOSIS — Z5189 Encounter for other specified aftercare: Secondary | ICD-10-CM | POA: Diagnosis not present

## 2015-04-14 DIAGNOSIS — N61 Inflammatory disorders of breast: Secondary | ICD-10-CM

## 2015-04-14 DIAGNOSIS — N611 Abscess of the breast and nipple: Secondary | ICD-10-CM

## 2015-04-14 NOTE — Progress Notes (Signed)
    MRN: [id number] DOB: 1956-06-15  Subjective:   ARIYANAH Gonzales is a 59 y.o. female presenting for follow up on breast abscess. Reports that she has removed gauze as instructed. Denies fevers, erythema, pain. Has continued with doxycycline, still has 4 days left. Denies any other aggravating or relieving factors, no other questions or concerns.  [name] has a current medication list which includes the following prescription(s): amlodipine, aspirin, cetirizine, doxycycline, epinephrine, montelukast, simvastatin, amlodipine, and montelukast. She is allergic to iodinated diagnostic agents and lisinopril.  Tiffany Gonzales  has a past medical history of Depression; Panic attacks; Hypertension; Fibroids; Colon polyps; Atypical chest pain (01/2007); Allergy; and Hyperlipidemia. Also  has past surgical history that includes Abdominal hysterectomy (1998) and Nasal sinus surgery (2007).  ROS As in subjective.  Objective:   Vitals: BP 122/76 mmHg  Pulse 73  Temp(Src) 99.7 F (37.6 C) (Oral)  Resp 16  SpO2 98%  Physical Exam  Constitutional: She is oriented to person, place, and time. She appears well-developed and well-nourished.  Cardiovascular: Normal rate.   Pulmonary/Chest: Effort normal.    Neurological: She is alert and oriented to person, place, and time.  Skin: Skin is warm and dry. No rash noted. No erythema. No pallor.   Assessment and Plan :   1. Encounter for wound care 2. Breast abscess - Stable, healing slower than expected. Did not pack wound this visit, will recheck in 4 days at end of antibiotic course. Advised she change dressing daily, keep area clean and dry. Patient agreed.  Jaynee Eagles, PA-C [facility] [phone] 04/14/2015 4:35 PM

## 2015-04-18 ENCOUNTER — Ambulatory Visit (INDEPENDENT_AMBULATORY_CARE_PROVIDER_SITE_OTHER): Payer: 59 | Admitting: Urgent Care

## 2015-04-18 VITALS — BP 100/70 | HR 77 | Temp 98.9°F | Ht 64.0 in | Wt 156.2 lb

## 2015-04-18 DIAGNOSIS — N611 Abscess of the breast and nipple: Secondary | ICD-10-CM

## 2015-04-18 DIAGNOSIS — N61 Inflammatory disorders of breast: Secondary | ICD-10-CM | POA: Diagnosis not present

## 2015-04-18 MED ORDER — SULFAMETHOXAZOLE-TRIMETHOPRIM 800-160 MG PO TABS: 1.0000 | ORAL_TABLET | Freq: Two times a day (BID) | ORAL | Status: DC

## 2015-04-18 NOTE — Patient Instructions (Signed)
Incision and Drainage Incision and drainage is a procedure in which a sac-like structure (cystic structure) is opened and drained. The area to be drained usually contains material such as pus, fluid, or blood.  LET YOUR CAREGIVER KNOW ABOUT:   Allergies to medicine.  Medicines taken, including vitamins, herbs, eyedrops, over-the-counter medicines, and creams.  Use of steroids (by mouth or creams).  Previous problems with anesthetics or numbing medicines.  History of bleeding problems or blood clots.  Previous surgery.  Other health problems, including diabetes and kidney problems.  Possibility of pregnancy, if this applies. RISKS AND COMPLICATIONS  Pain.  Bleeding.  Scarring.  Infection. BEFORE THE PROCEDURE  You may need to have an ultrasound or other imaging tests to see how large or deep your cystic structure is. Blood tests may also be used to determine if you have an infection or how severe the infection is. You may need to have a tetanus shot. PROCEDURE  The affected area is cleaned with a cleaning fluid. The cyst area will then be numbed with a medicine (local anesthetic). A small incision will be made in the cystic structure. A syringe or catheter may be used to drain the contents of the cystic structure, or the contents may be squeezed out. The area will then be flushed with a cleansing solution. After cleansing the area, it is often gently packed with a gauze or another wound dressing. Once it is packed, it will be covered with gauze and tape or some other type of wound dressing. AFTER THE PROCEDURE   Often, you will be allowed to go home right after the procedure.  You may be given antibiotic medicine to prevent or heal an infection.  If the area was packed with gauze or some other wound dressing, you will likely need to come back in 1 to 2 days to get it removed.  The area should heal in about 14 days. Document Released: 05/03/2001 Document Revised: 05/08/2012  Document Reviewed: 01/02/2012 [facility] Patient Information 2015 [place], [place]. This information is not intended to replace advice given to you by your health care provider. Make sure you discuss any questions you have with your health care provider.   Abscess An abscess is an infected area that contains a collection of pus and debris.It can occur in almost any part of the body. An abscess is also known as a furuncle or boil. CAUSES  An abscess occurs when tissue gets infected. This can occur from blockage of oil or sweat glands, infection of hair follicles, or a minor injury to the skin. As the body tries to fight the infection, pus collects in the area and creates pressure under the skin. This pressure causes pain. People with weakened immune systems have difficulty fighting infections and get certain abscesses more often.  SYMPTOMS Usually an abscess develops on the skin and becomes a painful mass that is red, warm, and tender. If the abscess forms under the skin, you may feel a moveable soft area under the skin. Some abscesses break open (rupture) on their own, but most will continue to get worse without care. The infection can spread deeper into the body and eventually into the bloodstream, causing you to feel ill.  DIAGNOSIS  Your caregiver will take your medical history and perform a physical exam. A sample of fluid may also be taken from the abscess to determine what is causing your infection. TREATMENT  Your caregiver may prescribe antibiotic medicines to fight the infection. However, taking antibiotics alone  usually does not cure an abscess. Your caregiver may need to make a small cut (incision) in the abscess to drain the pus. In some cases, gauze is packed into the abscess to reduce pain and to continue draining the area. HOME CARE INSTRUCTIONS   Only take over-the-counter or prescription medicines for pain, discomfort, or fever as directed by your caregiver.  If you were prescribed  antibiotics, take them as directed. Finish them even if you start to feel better.  If gauze is used, follow your caregiver's directions for changing the gauze.  To avoid spreading the infection:  Keep your draining abscess covered with a bandage.  Wash your hands well.  Do not share personal care items, towels, or whirlpools with others.  Avoid skin contact with others.  Keep your skin and clothes clean around the abscess.  Keep all follow-up appointments as directed by your caregiver. SEEK MEDICAL CARE IF:   You have increased pain, swelling, redness, fluid drainage, or bleeding.  You have muscle aches, chills, or a general ill feeling.  You have a fever. MAKE SURE YOU:   Understand these instructions.  Will watch your condition.  Will get help right away if you are not doing well or get worse. Document Released: 08/17/2005 Document Revised: 05/08/2012 Document Reviewed: 01/20/2012 [facility] Patient Information 2015 [place], [place]. This information is not intended to replace advice given to you by your health care provider. Make sure you discuss any questions you have with your health care provider.

## 2015-04-18 NOTE — Progress Notes (Signed)
    MRN: [id number] DOB: [birth year]  Subjective:   Tiffany Gonzales is a 58 y.o. female presenting for follow up on breast abscess. Reports drainage of pus still. Has continued doxycycline, has 2 doses left. Denies fevers, erythema, edema and pain. Patient has followed all instructions regarding wound care. Denies any other aggravating or relieving factors, no other questions or concerns.  Tiffany Gonzales has a current medication list which includes the following prescription(s): amlodipine, aspirin, cetirizine, epinephrine, montelukast, simvastatin, and sulfamethoxazole-trimethoprim. She is allergic to iodinated diagnostic agents and lisinopril.  Tiffany Gonzales  has a past medical history of Depression; Panic attacks; Hypertension; Fibroids; Colon polyps; Atypical chest pain (01/2007); Allergy; and Hyperlipidemia. Also  has past surgical history that includes Abdominal hysterectomy (1998) and Nasal sinus surgery (2007).  ROS As in subjective.  Objective:   Vitals: BP 100/70 mmHg  Pulse 77  Temp(Src) 98.9 F (37.2 C) (Oral)  Ht 5\' 4"  (1.626 m)  Wt 156 lb 4 oz (70.875 kg)  BMI 26.81 kg/m2  SpO2 93%  Physical Exam  Constitutional: She appears well-developed and well-nourished.  Cardiovascular: Normal rate.   Pulmonary/Chest: Effort normal.    Skin: Skin is warm and dry. No rash noted. No erythema. No pallor.    Assessment and Plan :   1. Breast abscess - Wound culture pending, given progression of abscess, will switch to Septra for an additional 10 days. - Patient to follow up in 4 days for Wound recheck.  Jaynee Eagles, PA-C [facility] [phone] 04/18/2015 11:56 AM

## 2015-04-20 ENCOUNTER — Telehealth: Payer: Self-pay | Admitting: Urgent Care

## 2015-04-20 LAB — WOUND CULTURE
GRAM STAIN: NONE SEEN
Gram Stain: NONE SEEN
Gram Stain: NONE SEEN

## 2015-04-20 NOTE — Telephone Encounter (Signed)
Reported negative wound culture. Patient states that the wound has not closed completely but there is still pus and drainage when she changes her dressing which she has been doing 1-2x per day since Saturday. Patient has started Septra, completed doxycycline, plans on rtc on 04/22/2015.

## 2015-04-20 NOTE — Telephone Encounter (Signed)
Called her and she will come and see me on Wednesday for a recheck

## 2015-04-22 ENCOUNTER — Ambulatory Visit (INDEPENDENT_AMBULATORY_CARE_PROVIDER_SITE_OTHER): Payer: 59 | Admitting: Internal Medicine

## 2015-04-22 VITALS — BP 132/62 | HR 94 | Temp 98.6°F | Resp 16 | Ht 64.0 in | Wt 156.8 lb

## 2015-04-22 DIAGNOSIS — N611 Abscess of the breast and nipple: Secondary | ICD-10-CM

## 2015-04-22 DIAGNOSIS — N63 Unspecified lump in unspecified breast: Secondary | ICD-10-CM

## 2015-04-22 DIAGNOSIS — Z5189 Encounter for other specified aftercare: Secondary | ICD-10-CM | POA: Diagnosis not present

## 2015-04-22 DIAGNOSIS — N61 Inflammatory disorders of breast: Secondary | ICD-10-CM | POA: Diagnosis not present

## 2015-04-22 NOTE — Patient Instructions (Signed)
-   Please continue taking the antibiotic prescribed - We will contact you with the appointment time and date for breast ultrasound and mammography - If no abnormal findings result from imaging, we will likely send you to General Surgery for further evaluation and management of her breast abscess/mass

## 2015-04-22 NOTE — Progress Notes (Signed)
    MRN: [id number] DOB: 15-Feb-1956  Subjective:   Tiffany Gonzales is a 59 y.o. female presenting for follow up on wound care for breast abscess. Reports the wound has closed. She has started taking Septra since her last visit. Denies feverish, erythema, swelling, pain, drainage pus or blood. Has gotten her mammograms yearly, last one in April of 2015. She denies ever having abnormal findings. Denies any other aggravating or relieving factors, no other questions or concerns.  Tiffany Gonzales has a current medication list which includes the following prescription(s): amlodipine, aspirin, cetirizine, epinephrine, montelukast, simvastatin, and sulfamethoxazole-trimethoprim. She is allergic to iodinated diagnostic agents and lisinopril.  Tiffany Gonzales  has a past medical history of Depression; Panic attacks; Hypertension; Fibroids; Colon polyps; Atypical chest pain (01/2007); Allergy; and Hyperlipidemia. Also  has past surgical history that includes Abdominal hysterectomy (1998) and Nasal sinus surgery (2007).  ROS As in subjective.  Objective:   Vitals: BP 132/62 mmHg  Pulse 94  Temp(Src) 98.6 F (37 C)  Resp 16  Ht 5\' 4"  (1.626 m)  Wt 156 lb 12.8 oz (71.124 kg)  BMI 26.90 kg/m2  SpO2 96%  Physical Exam  Constitutional: She is oriented to person, place, and time. She appears well-developed and well-nourished.  Cardiovascular: Normal rate.   Pulmonary/Chest: Effort normal. Right breast exhibits mass (as depicted). Right breast exhibits no inverted nipple, no nipple discharge, no skin change and no tenderness. Left breast exhibits no inverted nipple, no mass, no nipple discharge, no skin change and no tenderness. Breasts are symmetrical. There is no breast swelling.    Genitourinary: No breast tenderness, discharge or bleeding.  Neurological: She is alert and oriented to person, place, and time.  Skin: Skin is warm and dry. No rash noted. No erythema. No pallor.   Assessment and Plan :   1.  Breast abscess 2. Encounter for wound care 3. Breast mass in female - I precepted this case with Dr. Elder Cyphers. Patient is stable, advised to continue Septra to completion, will refer to breast Center for ultrasound and mammography to further evaluate breast mass and delayed healing of abscess, suspect that this may be cyst but will attempt to rule out malignancy. Patient was also shocked her to apply warm compresses to the affected area, call our clinic if imaging has not been scheduled within one week.  Tiffany Eagles, PA-C [facility] [phone] 04/22/2015 4:04 PM

## 2015-04-28 ENCOUNTER — Ambulatory Visit
Admission: RE | Admit: 2015-04-28 | Discharge: 2015-04-28 | Disposition: A | Discharge status: Home / Self Care | Payer: 59 | Source: Ambulatory Visit | Attending: Urgent Care | Admitting: Urgent Care

## 2015-04-28 DIAGNOSIS — N611 Abscess of the breast and nipple: Secondary | ICD-10-CM

## 2015-04-28 DIAGNOSIS — N63 Unspecified lump in unspecified breast: Secondary | ICD-10-CM

## 2015-04-29 ENCOUNTER — Other Ambulatory Visit: Payer: Self-pay | Admitting: Urgent Care

## 2015-04-29 ENCOUNTER — Telehealth: Payer: Self-pay | Admitting: Urgent Care

## 2015-04-29 DIAGNOSIS — N611 Abscess of the breast and nipple: Secondary | ICD-10-CM

## 2015-04-29 MED ORDER — DOXYCYCLINE HYCLATE 100 MG PO CAPS: 100.0000 mg | ORAL_CAPSULE | Freq: Every day | ORAL | Status: DC

## 2015-04-29 NOTE — Telephone Encounter (Signed)
Patient called shortly after I left her voice message. This was in regard to her breast ultrasound and mammogram which demonstrated persistent right-sided breast abscess. I discussed plan with patient and we agreed to restart doxycycline and she has completed a course of Septra. I requested an urgent referral be made to Gen. Surgery for further management in the meantime patient is to take doxycycline once a day with food. I counseled patient on potential for adverse affects with prolonged antibiotic use, patient verbalized understanding.

## 2015-05-02 ENCOUNTER — Ambulatory Visit (INDEPENDENT_AMBULATORY_CARE_PROVIDER_SITE_OTHER): Payer: 59 | Admitting: Family Medicine

## 2015-05-02 VITALS — BP 122/74 | HR 76 | Temp 99.6°F | Resp 17 | Ht 64.0 in | Wt 154.0 lb

## 2015-05-02 DIAGNOSIS — K1120 Sialoadenitis, unspecified: Secondary | ICD-10-CM | POA: Diagnosis not present

## 2015-05-02 DIAGNOSIS — N61 Inflammatory disorders of breast: Secondary | ICD-10-CM

## 2015-05-02 DIAGNOSIS — N611 Abscess of the breast and nipple: Secondary | ICD-10-CM

## 2015-05-02 MED ORDER — AMOXICILLIN-POT CLAVULANATE 875-125 MG PO TABS: 1.0000 | ORAL_TABLET | Freq: Two times a day (BID) | ORAL | Status: DC

## 2015-05-02 NOTE — Progress Notes (Signed)
  Subjective:  Patient ID: Tiffany Gonzales, female    DOB: [birth year]  Age: 59 y.o. MRN: [id number]  Patient has been here several times lately. She's had a right breast abscess which has been very slow resolving. It is better than it was, but ultrasound did show some persistent cystic affect. She is scheduled to see a surgeon regarding this in 3 days. She still got a little drainage out of it this morning.  Yesterday she noted a gland swollen than the right side of her neck underneath the chin. It actually is gone down a little bit today though it's still swollen. She says she was just drinking something it seemed to swell up.   Objective:   Pleasant lady in no major acute distress. Throat looks clear except for some brownish staining of the tongue from tobacco. No gross anomalies of the mucosa could be noted or anything abnormal in appearance underneath the tongue. The neck has an enlarged right submandibular gland small node adjacent to it. It is bigger than the left. On palpation underneath her tongue she is a little tender underneath there.  The breast was again examined, just checking the abscess. No pus was present at this time. Does not feel like a significant cystic nodular area now.   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 Discontinue the doxycycline. Use some lemon or bitter lemon candies and see if that will help shrink the swelling down. There are no Patient Instructions on file for this visit.   HOPPER,DAVID, MD 05/02/2015

## 2015-05-02 NOTE — Patient Instructions (Signed)
Try sucking on some slices of lemon and or taking lemon drops.  Discontinue the doxycycline  Begin Augmentin 875 twice daily  Return if worse at anytime, otherwise if it continues to swell up after a week or 10 days she should come back  Keep your appointment with the surgeon to recheck the breast, but it is hopeful that the Augmentin well help it further.

## 2015-06-19 ENCOUNTER — Telehealth: Payer: Self-pay | Admitting: Internal Medicine

## 2015-06-19 NOTE — Telephone Encounter (Signed)
lmtcb x1 

## 2015-06-22 NOTE — Telephone Encounter (Signed)
lmtcb X2 for [employer]

## 2015-06-23 NOTE — Telephone Encounter (Signed)
Tiffany Gonzales from [facility], [phone]  Pt case# [id number] cpap PA denied cpap download did show pt was compliant, however, they need recent follow up ov notes that shows pt is benefiting from therapy may request peer to peer within14 days at [phone] using case #[id number] May appeal as well

## 2015-06-23 NOTE — Telephone Encounter (Signed)
Dr Annamaria Boots are you interested in doing appeal? I do not see any supporting ov notes stating CPAP is helping since pt last seen 2014 Please advise, thanks

## 2015-06-24 NOTE — Telephone Encounter (Signed)
Spoke with pt. Scheduled appt w/RA for f/u. Pt last seen 2014. CPAP PA denied per note. Nothing further needed

## 2015-06-24 NOTE — Telephone Encounter (Signed)
Dr Elsworth Soho has been her sleep apnea doc, I just saw her for allergy. I can't tell what the issue is from the phone note.  If she needs a new machine, then maybe she can continue with her old machine until he can see her again.

## 2015-07-08 ENCOUNTER — Other Ambulatory Visit: Payer: Self-pay | Admitting: Emergency Medicine

## 2015-07-08 ENCOUNTER — Telehealth: Payer: Self-pay

## 2015-07-08 NOTE — Telephone Encounter (Signed)
Patient called to request a referral to a pulmonologist for a CPAP machine.  She has been seen by a pulmonologist previously, but as far as I could tell, we were not the ones to refer her.  She has [employer], so perhaps that is the issue now.  From notes in EPIC from the pulm doctor's office, it appears that her request for coverage for a CPAP machine was denied, as was her appeal.  Perhaps she needs to be seen by [place] (her PCP w/ insurance) and a letter sent to her insurance company showing medical necessity.  She said she typically sees Dr. Laney Pastor.  Please advise.  Thank you.  CB#: [id number]

## 2015-07-09 NOTE — Telephone Encounter (Signed)
Spoke with Tiffany Gonzales, advised message from Dr. Laney Pastor. Tiffany Gonzales understood. Could someone fit her in and call her to let her know what time on next Wednesday.

## 2015-07-09 NOTE — Telephone Encounter (Signed)
I have only seen her once but did go over all of her problems in January 2016  I suggest you set up an appointment for her on a late Wednesday morning with Tor Netters at [address] on a day when I also have appointments so we can discuss the outcome together

## 2015-07-10 NOTE — Telephone Encounter (Signed)
Tiffany Gonzales, Are you going to be at [address] this coming Wednesday to help Dr. Laney Pastor?  I looked in the computer and you do not have appointments that day.  If you are going to be there Marzetta Board could overbook her on Dr. Ninfa Meeker schedule.  Please advise what you want [place] to do.  Thanks!

## 2015-07-10 NOTE — Telephone Encounter (Signed)
I will be there from 2-6 8/24, and 1-6 8/31. I will start having Wed. Am appointments in September if she can wait until then. Otherwise, over book Dr. Laney Pastor on one of the afternoons I am there if that is ok with him.

## 2015-07-14 ENCOUNTER — Telehealth: Payer: Self-pay | Admitting: Pulmonary Disease

## 2015-07-14 ENCOUNTER — Telehealth: Payer: Self-pay

## 2015-07-14 NOTE — Telephone Encounter (Signed)
[place] called about prior authorization for this patient.   Phone- [phone] Fax- [phone]

## 2015-07-14 NOTE — Telephone Encounter (Signed)
I see that the patient has an appointment with Dr. Laney Pastor at the end of September, was this satisfactory for her and can this encounter be closed?

## 2015-07-14 NOTE — Telephone Encounter (Signed)
Received call from [place] requesting information  Needs face to face information faxed to Huey Romans Fax: [phone] Patient has not been seen in our office since 2014 OV note sent to [facility] Nothing further needed.

## 2015-07-28 NOTE — Telephone Encounter (Signed)
Called [facility] to see what is needed. See notes under last ph message. Pt has appt sch for 08/19/15 for CPE (AND FACE TO FACE VISIT FOR CPAP). OV notes MUST include info on pt's Dx and MUST stated that pt is improved with use of her CPAP machine for Medicare. If Medicare does not receive this info than they may deny claim and pt may have to pay for machine use. Dr Laney Pastor, please have someone fax the OV notes from your OV to [place] at fax below when you complete them.

## 2015-08-07 ENCOUNTER — Other Ambulatory Visit: Payer: Self-pay | Admitting: Emergency Medicine

## 2015-08-11 ENCOUNTER — Ambulatory Visit: Payer: 59 | Admitting: Pulmonary Disease

## 2015-08-12 ENCOUNTER — Encounter: Payer: 59 | Admitting: Internal Medicine

## 2015-08-19 ENCOUNTER — Ambulatory Visit (INDEPENDENT_AMBULATORY_CARE_PROVIDER_SITE_OTHER): Payer: 59 | Admitting: Internal Medicine

## 2015-08-19 ENCOUNTER — Encounter: Payer: Self-pay | Admitting: Internal Medicine

## 2015-08-19 VITALS — BP 131/78 | HR 67 | Temp 98.6°F | Resp 16 | Ht 64.0 in | Wt 162.4 lb

## 2015-08-19 DIAGNOSIS — J302 Other seasonal allergic rhinitis: Secondary | ICD-10-CM

## 2015-08-19 DIAGNOSIS — Z0000 Encounter for general adult medical examination without abnormal findings: Secondary | ICD-10-CM | POA: Diagnosis not present

## 2015-08-19 DIAGNOSIS — I10 Essential (primary) hypertension: Secondary | ICD-10-CM | POA: Diagnosis not present

## 2015-08-19 DIAGNOSIS — Z1321 Encounter for screening for nutritional disorder: Secondary | ICD-10-CM | POA: Diagnosis not present

## 2015-08-19 DIAGNOSIS — N941 Dyspareunia: Secondary | ICD-10-CM | POA: Diagnosis not present

## 2015-08-19 DIAGNOSIS — Z113 Encounter for screening for infections with a predominantly sexual mode of transmission: Secondary | ICD-10-CM | POA: Diagnosis not present

## 2015-08-19 DIAGNOSIS — G4733 Obstructive sleep apnea (adult) (pediatric): Secondary | ICD-10-CM

## 2015-08-19 DIAGNOSIS — Z91013 Allergy to seafood: Secondary | ICD-10-CM | POA: Diagnosis not present

## 2015-08-19 DIAGNOSIS — Z23 Encounter for immunization: Secondary | ICD-10-CM

## 2015-08-19 DIAGNOSIS — IMO0002 Reserved for concepts with insufficient information to code with codable children: Secondary | ICD-10-CM

## 2015-08-19 DIAGNOSIS — E7800 Pure hypercholesterolemia, unspecified: Secondary | ICD-10-CM

## 2015-08-19 DIAGNOSIS — R197 Diarrhea, unspecified: Secondary | ICD-10-CM | POA: Diagnosis not present

## 2015-08-19 LAB — COMPREHENSIVE METABOLIC PANEL
ALK PHOS: 71 U/L (ref 33–130)
ALT: 21 U/L (ref 6–29)
AST: 19 U/L (ref 10–35)
Albumin: 4.2 g/dL (ref 3.6–5.1)
BUN: 9 mg/dL (ref 7–25)
CO2: 30 mmol/L (ref 20–31)
CREATININE: 0.67 mg/dL (ref 0.50–0.99)
Calcium: 10.1 mg/dL (ref 8.6–10.4)
Chloride: 105 mmol/L (ref 98–110)
Glucose, Bld: 86 mg/dL (ref 65–99)
Potassium: 4.2 mmol/L (ref 3.5–5.3)
SODIUM: 144 mmol/L (ref 135–146)
TOTAL PROTEIN: 6.9 g/dL (ref 6.1–8.1)
Total Bilirubin: 0.3 mg/dL (ref 0.2–1.2)

## 2015-08-19 LAB — CBC
HCT: 37.9 % (ref 36.0–46.0)
Hemoglobin: 13.4 g/dL (ref 12.0–15.0)
MCH: 30.7 pg (ref 26.0–34.0)
MCHC: 35.4 g/dL (ref 30.0–36.0)
MCV: 86.7 fL (ref 78.0–100.0)
MPV: 8.9 fL (ref 8.6–12.4)
PLATELETS: 224 10*3/uL (ref 150–400)
RBC: 4.37 MIL/uL (ref 3.87–5.11)
RDW: 14.4 % (ref 11.5–15.5)
WBC: 4.8 10*3/uL (ref 4.0–10.5)

## 2015-08-19 LAB — LIPID PANEL
CHOLESTEROL: 168 mg/dL (ref 125–200)
HDL: 50 mg/dL (ref >=46)
LDL Cholesterol: 108 mg/dL (ref <130)
Total CHOL/HDL Ratio: 3.4 Ratio (ref <=5.0)
Triglycerides: 48 mg/dL (ref <150)
VLDL: 10 mg/dL (ref <30)

## 2015-08-19 MED ORDER — FLUTICASONE PROPIONATE 50 MCG/ACT NA SUSP: 2.0000 | Freq: Every day | NASAL | Status: DC

## 2015-08-19 MED ORDER — AMLODIPINE BESYLATE 5 MG PO TABS
5.0000 mg | ORAL_TABLET | Freq: Every day | ORAL | Status: DC
Start: 2015-08-19 — End: 2015-12-01

## 2015-08-19 MED ORDER — EPINEPHRINE 0.3 MG/0.3ML IJ SOAJ: 0.3000 mg | Freq: Once | INTRAMUSCULAR | Status: DC

## 2015-08-19 MED ORDER — SIMVASTATIN 20 MG PO TABS: ORAL_TABLET | ORAL | Status: DC

## 2015-08-19 MED ORDER — MONTELUKAST SODIUM 10 MG PO TABS: 10.0000 mg | ORAL_TABLET | Freq: Every day | ORAL | Status: DC

## 2015-08-19 NOTE — Progress Notes (Signed)
Subjective:    Patient ID: Tiffany Gonzales, female    DOB: 12-Feb-1956, 59 y.o.   MRN: [id number]  HPI This is a very pleasant 60 yo female who presents today for CPE. She works part time for the [employer]. Her grown daughter lives with her.   Last CPE- unknown Mammo- 04/28/2015 Pap- hysterectomy  Colonoscopy- 03/30/2010, due 2018 Tdap- today Flu- declines Eye- due to have cataract surgery Dental- regular Exercise- occasional treadmill Diet- eats fast food daily and packaged frozen foods  She has a history of OSA and has had machine for at least 3 years. Her machine doesn't seem to be working as well and she has been trying to get [facility] to come out and calebrate her machine.   Patient smokes about 7 cigarettes a day. Had stopped for a year and a half then had more stress and picked up again.   Past Medical History  Diagnosis Date  . Depression   . Panic attacks   . Hypertension   . Fibroids   . Colon polyps   . Atypical chest pain 01/2007    negative stress test  . Allergy     seasonal  . Hyperlipidemia    Past Medical History  Diagnosis Date  . Depression   . Panic attacks   . Hypertension   . Fibroids   . Colon polyps   . Atypical chest pain 01/2007    negative stress test  . Allergy     seasonal  . Hyperlipidemia    Social History  Substance Use Topics  . Smoking status: Former Smoker -- 0.30 packs/day for 34 years    Types: Cigarettes  . Smokeless tobacco: Never Used     Comment: 7 cigs daily 08/20/13  . Alcohol Use: 0.6 oz/week    1 Glasses of wine per week     Comment: white wine   Review of Systems  Constitutional: Positive for appetite change (craving sweets) and unexpected weight change (has gained weight).  HENT: Positive for congestion and ear pain.   Eyes: Positive for visual disturbance (had cataract surgery scheduled but "chickened out," plans on rescheduling.).  Respiratory: Negative.   Cardiovascular: Positive for palpitations (occasionally,  couple time a week, lasts 10 minutes, only notices when she is sitting. No chest pain ).  Gastrointestinal: Positive for abdominal pain (sharp pains in bilateral sides, before BMs, releived with BM.) and diarrhea (loose stools since last week, has had some leaking, getting better. ).  Genitourinary: Positive for dyspareunia (Was not sexually active for a number of years, in a new relationship, has had some dryness. Used estrogen cream previously, would prefer not to use ).  Musculoskeletal: Negative.   Skin: Negative.   Allergic/Immunologic: Positive for environmental allergies.  Neurological: Negative.   Hematological: Negative.   Psychiatric/Behavioral: Negative.        Objective:   Physical Exam Physical Exam  Constitutional: She is oriented to person, place, and time. She appears well-developed and well-nourished. No distress.  HENT:  Head: Normocephalic and atraumatic.  Right Ear: External ear normal.  Left Ear: External ear normal.  Nose: Nose normal.  Mouth/Throat: Oropharynx is clear and moist. No oropharyngeal exudate.  Eyes: Conjunctivae are normal. Pupils are equal, round, and reactive to light.  Neck: Normal range of motion. Neck supple. No JVD present. No thyromegaly present.  Cardiovascular: Normal rate, regular rhythm, normal heart sounds and intact distal pulses.   Pulmonary/Chest: Effort normal and breath sounds normal. Right breast exhibits  no inverted nipple, no mass, no nipple discharge, no skin change and no tenderness. Left breast exhibits no inverted nipple, no mass, no nipple discharge, no skin change and no tenderness. Breasts are symmetrical.  Abdominal: Soft. Bowel sounds are normal. She exhibits no distension and no mass. There is no tenderness. There is no rebound and no guarding.  Genitourinary: Vagina slightly pale. Pelvic exam was performed with patient supine. There is no rash, tenderness, lesion or injury on the right labia. There is no rash, tenderness,  lesion or injury on the left labia. Cervix surgically absent. No vaginal discharge found.  Musculoskeletal: Normal range of motion. She exhibits no edema or tenderness.  Lymphadenopathy:    She has no cervical adenopathy.  Neurological: She is alert and oriented to person, place, and time. She has normal reflexes.  Skin: Skin is warm and dry. She is not diaphoretic.  Psychiatric: She has a normal mood and affect. Her behavior is normal. Judgment and thought content normal.  Vitals reviewed.  BP 131/78 mmHg  Pulse 67  Temp(Src) 98.6 F (37 C) (Oral)  Resp 16  Ht 5\' 4"  (1.626 m)  Wt 162 lb 6.4 oz (73.664 kg)  BMI 27.86 kg/m2  SpO2 100% Wt Readings from Last 3 Encounters:  08/19/15 162 lb 6.4 oz (73.664 kg)  05/02/15 154 lb (69.854 kg)  04/22/15 156 lb 12.8 oz (71.124 kg)      Assessment & Plan:  1. Annual physical exam - discussed healthy lifestyle habits- smoking cessation, adequate sleep, regular nutritious meals, regular exercise  2. Need for Tdap vaccination - Tdap vaccine greater than or equal to 7yo IM  3. Essential hypertension - Blood pressure good on current dose of amlodipine - CBC - Comprehensive metabolic panel - amLODipine (NORVASC) 5 MG tablet; Take 1 tablet (5 mg total) by mouth daily.  Dispense: 90 tablet; Refill: 3  4. OSA (obstructive sleep apnea) - Ambulatory referral to Pulmonology  5. HYPERCHOLESTEROLEMIA - Lipid panel - simvastatin (ZOCOR) 20 MG tablet; TAKE 1 TABLET AT BEDTIME  Dispense: 90 tablet; Refill: 3  6. Encounter for vitamin deficiency screening - Vit D  25 hydroxy (rtn osteoporosis monitoring)  7. Screening for STD (sexually transmitted disease) - GC/Chlamydia Probe Amp - HIV antibody - RPR  8. Other seasonal allergic rhinitis - montelukast (SINGULAIR) 10 MG tablet; Take 1 tablet (10 mg total) by mouth at bedtime.  Dispense: 90 tablet; Refill: 3 - fluticasone (FLONASE) 50 MCG/ACT nasal spray; Place 2 sprays into both nostrils daily.   Dispense: 16 g; Refill: 6  9. Allergy to shellfish - EPINEPHrine 0.3 mg/0.3 mL IJ SOAJ injection; Inject 0.3 mLs (0.3 mg total) into the muscle once.  Dispense: 1 Device; Refill: 1  10. Diarrhea - this is associated with a recent trip, patient reports it is improving. She was instructed to let me know if not resolved within 1 week and we will check stool studies - she can try 1/2- 1 tablet Immodium prn  11. Dyspareunia - Discussed restarting estrogen cream as this worked in the past, patient would prefer to try some different OTC lubricants first   Clarene Reamer, FNP-BC  [facility]  08/20/2015 7:55 AM

## 2015-08-19 NOTE — Patient Instructions (Addendum)
Try to eat more yogurt or try an OTC probiotic for abdominal discomfort Try KY moisture beads/perles for vaginal dryness.

## 2015-08-20 LAB — RPR

## 2015-08-20 LAB — VITAMIN D 25 HYDROXY (VIT D DEFICIENCY, FRACTURES): Vit D, 25-Hydroxy: 40 ng/mL (ref 30–100)

## 2015-08-20 LAB — HIV ANTIBODY (ROUTINE TESTING W REFLEX): HIV: NONREACTIVE

## 2015-08-20 LAB — GC/CHLAMYDIA PROBE AMP
CT Probe RNA: NEGATIVE
GC PROBE AMP APTIMA: NEGATIVE

## 2015-08-27 ENCOUNTER — Encounter: Payer: Self-pay | Admitting: Pulmonary Disease

## 2015-08-27 ENCOUNTER — Ambulatory Visit (INDEPENDENT_AMBULATORY_CARE_PROVIDER_SITE_OTHER): Payer: 59 | Admitting: Pulmonary Disease

## 2015-08-27 VITALS — BP 120/70 | HR 58 | Temp 98.5°F | Ht 64.0 in | Wt 164.2 lb

## 2015-08-27 DIAGNOSIS — G4733 Obstructive sleep apnea (adult) (pediatric): Secondary | ICD-10-CM

## 2015-08-27 NOTE — Addendum Note (Signed)
Addended by: Mathis Dad on: 08/27/2015 04:19 PM   Modules accepted: Orders

## 2015-08-27 NOTE — Assessment & Plan Note (Signed)
Obtain CPAP download from [place] Will change to fixed pressure setitngs CPAP supplies will be renewed x 1 year  Weight loss encouraged, compliance with goal of at least 4-6 hrs every night is the expectation. Advised against medications with sedative side effects Cautioned against driving when sleepy - understanding that sleepiness will vary on a day to day basis

## 2015-08-27 NOTE — Progress Notes (Signed)
   Subjective:    Patient ID: Tiffany Gonzales, female    DOB: 06/24/1956, 59 y.o.   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  01/2012 -PSG showed AHI 13/h    08/27/2015  Chief Complaint  Patient presents with  . Follow-up    follow up per Dr. Edison Simon. pt. states she wears CPAP 7-8 hours every night but waking up 2 times a night. pressure needs to be stronger. supplies needed. DME [facility].   Last OV 09/2013 She took early retirement to take care of her mother On autoCPAP- feels pr starts off too low - would like higher Mask ok, no dryness Travelled to [place], took CPAP with her.  07/2013 download on auto 5-12 shows avg pr, AHI 1.6/h, excellent compliance  She feels rested during the day. Has no complaints.  Nasal pillows seems to work well, no dryness, pressure ok  Review of Systems neg for any significant sore throat, dysphagia, itching, sneezing, nasal congestion or excess/ purulent secretions, fever, chills, sweats, unintended wt loss, pleuritic or exertional cp, hempoptysis, orthopnea pnd or change in chronic leg swelling. Also denies presyncope, palpitations, heartburn, abdominal pain, nausea, vomiting, diarrhea or change in bowel or urinary habits, dysuria,hematuria, rash, arthralgias, visual complaints, headache, numbness weakness or ataxia.     Objective:   Physical Exam  Gen. Pleasant, well-nourished, in no distress ENT - no lesions, no post nasal drip Neck: No JVD, no thyromegaly, no carotid bruits Lungs: no use of accessory muscles, no dullness to percussion, clear without rales or rhonchi  Cardiovascular: Rhythm regular, heart sounds  normal, no murmurs or gallops, no peripheral edema Musculoskeletal: No deformities, no cyanosis or clubbing         Assessment & Plan:

## 2015-08-27 NOTE — Patient Instructions (Signed)
Obtain CPAP download from [place] Will change to fixed pressure setitngs CPAP supplies will be renewed x 1 year

## 2015-09-08 ENCOUNTER — Telehealth: Payer: Self-pay | Admitting: Internal Medicine

## 2015-09-08 NOTE — Telephone Encounter (Signed)
Referral for cataract surgery with Dr. Herbert Deaner. Please advise.  [phone]

## 2015-09-08 NOTE — Telephone Encounter (Signed)
Diagnosis    H26.9 (ICD-10-CM) - Cataracts, bilateral     Referral for [facility]

## 2015-09-09 ENCOUNTER — Telehealth: Payer: Self-pay | Admitting: Pulmonary Disease

## 2015-09-09 NOTE — Telephone Encounter (Signed)
Average pressure 10cm Good usage No residuals

## 2015-09-10 NOTE — Telephone Encounter (Signed)
Patient notified.  Nothing further needed. 

## 2015-09-16 ENCOUNTER — Other Ambulatory Visit: Payer: Self-pay | Admitting: Internal Medicine

## 2015-09-18 ENCOUNTER — Encounter: Payer: Self-pay | Admitting: Pulmonary Disease

## 2015-10-19 ENCOUNTER — Encounter: Payer: Self-pay | Admitting: Internal Medicine

## 2015-12-01 ENCOUNTER — Other Ambulatory Visit: Payer: Self-pay

## 2015-12-01 DIAGNOSIS — J302 Other seasonal allergic rhinitis: Secondary | ICD-10-CM

## 2015-12-01 DIAGNOSIS — I10 Essential (primary) hypertension: Secondary | ICD-10-CM

## 2015-12-01 MED ORDER — AMLODIPINE BESYLATE 5 MG PO TABS: 5.0000 mg | ORAL_TABLET | Freq: Every day | ORAL | Status: DC

## 2015-12-01 MED ORDER — MONTELUKAST SODIUM 10 MG PO TABS: 10.0000 mg | ORAL_TABLET | Freq: Every day | ORAL | Status: DC

## 2015-12-06 ENCOUNTER — Other Ambulatory Visit: Payer: Self-pay | Admitting: Emergency Medicine

## 2015-12-07 ENCOUNTER — Other Ambulatory Visit: Payer: Self-pay

## 2015-12-07 MED ORDER — SIMVASTATIN 20 MG PO TABS: 20.0000 mg | ORAL_TABLET | Freq: Every day | ORAL | Status: DC

## 2015-12-30 ENCOUNTER — Ambulatory Visit (INDEPENDENT_AMBULATORY_CARE_PROVIDER_SITE_OTHER): Payer: BLUE CROSS/BLUE SHIELD | Admitting: Physician Assistant

## 2015-12-30 VITALS — BP 120/80 | HR 82 | Temp 99.1°F | Resp 20 | Ht 65.35 in | Wt 166.8 lb

## 2015-12-30 DIAGNOSIS — J209 Acute bronchitis, unspecified: Secondary | ICD-10-CM | POA: Diagnosis not present

## 2015-12-30 MED ORDER — GUAIFENESIN ER 1200 MG PO TB12: 1.0000 | ORAL_TABLET | Freq: Two times a day (BID) | ORAL | Status: DC | PRN

## 2015-12-30 MED ORDER — AZITHROMYCIN 250 MG PO TABS: ORAL_TABLET | ORAL | Status: DC

## 2015-12-30 NOTE — Progress Notes (Signed)
[facility] [address] [place] [address] [phone]  Date:  12/30/2015   Name:  Tiffany Gonzales [facility]   DOB:  15-Jan-1956   MRN:  [id number]  PCP:  Leandrew Koyanagi, MD    History of Present Illness:  Tiffany Gonzales is a 60 y.o. female patient who presents to [facility] for cc of cough and congestion.   Nasal congestion, and a non-productive cough that began 2 weeks ago.  She feels more fatigued.  No facial pain.  She has nasal congestion.  She has a cough that is non-productive.  Clear mucus.  She is taking singulair and zyrtec.  She has otalgia more in the left ear.  No sob or dyspnea.  No fever.  She has been using sinex which has helped some.   Patient Active Problem List   Diagnosis Date Noted  . Breast abscess 04/08/2015  . Memory loss 12/09/2013  . History of insect sting allergy 11/18/2012  . Post-menopause atrophic vaginitis 09/03/2012  . Allergic reaction 06/05/2012  . OSA (obstructive sleep apnea) 11/08/2011  . Cervical disc disease 06/28/2011  . TOBACCO ABUSE 03/01/2010  . LEUKOPENIA, MILD 12/10/2008  . HYPERCHOLESTEROLEMIA 12/09/2008  . Essential hypertension 03/05/2008  . DEPRESSIVE DISORDER 11/13/2007    Past Medical History  Diagnosis Date  . Depression   . Panic attacks   . Hypertension   . Fibroids   . Colon polyps   . Atypical chest pain 01/2007    negative stress test  . Allergy     seasonal  . Hyperlipidemia     Past Surgical History  Procedure Laterality Date  . Abdominal hysterectomy  1998  . Nasal sinus surgery  2007    Dr Mikey Bussing out area to help breath    Social History  Substance Use Topics  . Smoking status: Smoker, Current Status Unknown -- 0.30 packs/day for 34 years    Types: Cigarettes  . Smokeless tobacco: Never Used     Comment: 7 cigs daily 08/20/13  . Alcohol Use: 0.6 oz/week    1 Glasses of wine per week     Comment: white wine    Family History  Problem Relation Age of Onset  .  Arthritis Mother   . Hypertension Father   . Heart disease Maternal Grandmother   . Heart disease Paternal Grandmother     Allergies  Allergen Reactions  . Iodinated Diagnostic Agents Shortness Of Breath  . Lisinopril Hives    Medication list has been reviewed and updated.  Current Outpatient Prescriptions on File Prior to Visit  Medication Sig Dispense Refill  . amLODipine (NORVASC) 5 MG tablet Take 1 tablet (5 mg total) by mouth daily. 90 tablet 3  . aspirin 81 MG tablet Take 81 mg by mouth daily.      . cetirizine (ZYRTEC) 10 MG tablet Take 1 tablet (10 mg total) by mouth daily. 30 tablet 12  . EPINEPHrine 0.3 mg/0.3 mL IJ SOAJ injection Inject 0.3 mLs (0.3 mg total) into the muscle once. 1 Device 1  . fluticasone (FLONASE) 50 MCG/ACT nasal spray Place 2 sprays into both nostrils daily. 16 g 6  . montelukast (SINGULAIR) 10 MG tablet Take 1 tablet (10 mg total) by mouth at bedtime. 90 tablet 3  . simvastatin (ZOCOR) 20 MG tablet Take 1 tablet (20 mg total) by mouth at bedtime. 90 tablet 0   No current facility-administered medications on file prior to visit.    ROS ROS  otherwise unremarkable unless listed above.  Physical Examination: BP 120/80 mmHg  Pulse 82  Temp(Src) 99.1 F (37.3 C) (Oral)  Resp 20  Ht 5' 5.35" (1.66 m)  Wt 166 lb 12.8 oz (75.66 kg)  BMI 27.46 kg/m2  SpO2 95% Ideal Body Weight: Weight in (lb) to have BMI = 25: 151.6  Physical Exam  Constitutional: She is oriented to person, place, and time. She appears well-developed and well-nourished. No distress.  HENT:  Head: Normocephalic and atraumatic.  Right Ear: Tympanic membrane, external ear and ear canal normal.  Left Ear: Tympanic membrane, external ear and ear canal normal.  Nose: Mucosal edema and rhinorrhea present. Right sinus exhibits no maxillary sinus tenderness and no frontal sinus tenderness. Left sinus exhibits no maxillary sinus tenderness and no frontal sinus tenderness.  Mouth/Throat:  No uvula swelling. No oropharyngeal exudate, posterior oropharyngeal edema or posterior oropharyngeal erythema.  Eyes: Conjunctivae and EOM are normal. Pupils are equal, round, and reactive to light.  Cardiovascular: Normal rate and regular rhythm.  Exam reveals no gallop, no distant heart sounds and no friction rub.   No murmur heard. Pulmonary/Chest: Effort normal. No respiratory distress. She has no decreased breath sounds. She has no wheezes. She has no rhonchi.  Lymphadenopathy:       Head (right side): No submandibular, no tonsillar, no preauricular and no posterior auricular adenopathy present.       Head (left side): No submandibular, no tonsillar, no preauricular and no posterior auricular adenopathy present.  Neurological: She is alert and oriented to person, place, and time.  Skin: She is not diaphoretic.  Psychiatric: She has a normal mood and affect. Her behavior is normal.     Assessment and Plan: Tiffany Gonzales is a 60 y.o. female who is here today for cc of cough and congestion. Treating at this time. Advised to rtc if sxs do not improve. Acute bronchitis, unspecified organism - Plan: Guaifenesin (MUCINEX MAXIMUM STRENGTH) 1200 MG TB12, azithromycin (ZITHROMAX) 250 MG tablet  Ivar Drape, PA-C [facility] [place] 2/14/20178:50 AM

## 2015-12-30 NOTE — Patient Instructions (Signed)
Please hydrate well with 64 oz of water per day which is almost 4 regular sized water bottles. Please continue your medications (zyrtec, singulair, etc).    Acute Bronchitis Bronchitis is inflammation of the airways that extend from the windpipe into the lungs (bronchi). The inflammation often causes mucus to develop. This leads to a cough, which is the most common symptom of bronchitis.  In acute bronchitis, the condition usually develops suddenly and goes away over time, usually in a couple weeks. Smoking, allergies, and asthma can make bronchitis worse. Repeated episodes of bronchitis may cause further lung problems.  CAUSES Acute bronchitis is most often caused by the same virus that causes a cold. The virus can spread from person to person (contagious) through coughing, sneezing, and touching contaminated objects. SIGNS AND SYMPTOMS   Cough.   Fever.   Coughing up mucus.   Body aches.   Chest congestion.   Chills.   Shortness of breath.   Sore throat.  DIAGNOSIS  Acute bronchitis is usually diagnosed through a physical exam. Your health care provider will also ask you questions about your medical history. Tests, such as chest X-rays, are sometimes done to rule out other conditions.  TREATMENT  Acute bronchitis usually goes away in a couple weeks. Oftentimes, no medical treatment is necessary. Medicines are sometimes given for relief of fever or cough. Antibiotic medicines are usually not needed but may be prescribed in certain situations. In some cases, an inhaler may be recommended to help reduce shortness of breath and control the cough. A cool mist vaporizer may also be used to help thin bronchial secretions and make it easier to clear the chest.  HOME CARE INSTRUCTIONS  Get plenty of rest.   Drink enough fluids to keep your urine clear or pale yellow (unless you have a medical condition that requires fluid restriction). Increasing fluids may help thin your  respiratory secretions (sputum) and reduce chest congestion, and it will prevent dehydration.   Take medicines only as directed by your health care provider.  If you were prescribed an antibiotic medicine, finish it all even if you start to feel better.  Avoid smoking and secondhand smoke. Exposure to cigarette smoke or irritating chemicals will make bronchitis worse. If you are a smoker, consider using nicotine gum or skin patches to help control withdrawal symptoms. Quitting smoking will help your lungs heal faster.   Reduce the chances of another bout of acute bronchitis by washing your hands frequently, avoiding people with cold symptoms, and trying not to touch your hands to your mouth, nose, or eyes.   Keep all follow-up visits as directed by your health care provider.  SEEK MEDICAL CARE IF: Your symptoms do not improve after 1 week of treatment.  SEEK IMMEDIATE MEDICAL CARE IF:  You develop an increased fever or chills.   You have chest pain.   You have severe shortness of breath.  You have bloody sputum.   You develop dehydration.  You faint or repeatedly feel like you are going to pass out.  You develop repeated vomiting.  You develop a severe headache. MAKE SURE YOU:   Understand these instructions.  Will watch your condition.  Will get help right away if you are not doing well or get worse.   This information is not intended to replace advice given to you by your health care provider. Make sure you discuss any questions you have with your health care provider.   Document Released: 12/15/2004 Document Revised: 11/28/2014 Document  Reviewed: 04/30/2013 Elsevier Interactive Patient Education [employer].

## 2016-02-16 ENCOUNTER — Ambulatory Visit (INDEPENDENT_AMBULATORY_CARE_PROVIDER_SITE_OTHER): Payer: BLUE CROSS/BLUE SHIELD | Admitting: Family Medicine

## 2016-02-16 ENCOUNTER — Encounter: Payer: Self-pay | Admitting: Family Medicine

## 2016-02-16 VITALS — BP 106/68 | HR 74 | Temp 98.9°F | Resp 16 | Ht 64.0 in | Wt 161.8 lb

## 2016-02-16 DIAGNOSIS — I10 Essential (primary) hypertension: Secondary | ICD-10-CM | POA: Diagnosis not present

## 2016-02-16 DIAGNOSIS — J309 Allergic rhinitis, unspecified: Secondary | ICD-10-CM | POA: Diagnosis not present

## 2016-02-16 MED ORDER — MOMETASONE FUROATE 50 MCG/ACT NA SUSP: 2.0000 | Freq: Every day | NASAL | Status: DC

## 2016-02-16 MED ORDER — LORATADINE 10 MG PO TABS: 10.0000 mg | ORAL_TABLET | Freq: Every day | ORAL | Status: DC

## 2016-02-16 NOTE — Patient Instructions (Signed)
Please go to your mychart account in June or July to make your physical appointment for September.

## 2016-02-16 NOTE — Progress Notes (Signed)
Subjective:    Patient ID: Tiffany Gonzales, female    DOB: 1956-05-06, 60 y.o.   MRN: [id number]  HPI This is a pleasant 60 yo female who presents today for follow up of HTN, depression, OSA. She has changed her diet and is not eating fast food. She is working out 4-5 x/ week. Allergies are bad, still taking singulair and zyrtec. She has follow up appointment with pulmonary in the fall. No SOB or wheeze, more nasal symptoms. Nasonex works well for her, flonase does not work for her. She smokes 7-8 cigarettes a day.   She has a new boyfriend. He is very physically active and this has made her more active.   Past Medical History  Diagnosis Date  . Depression   . Panic attacks   . Hypertension   . Fibroids   . Colon polyps   . Atypical chest pain 01/2007    negative stress test  . Allergy     seasonal  . Hyperlipidemia    Past Surgical History  Procedure Laterality Date  . Abdominal hysterectomy  1998  . Nasal sinus surgery  2007    Dr Mikey Bussing out area to help breath   Family History  Problem Relation Age of Onset  . Arthritis Mother   . Hypertension Father   . Heart disease Maternal Grandmother   . Heart disease Paternal Grandmother    Social History   Social History  . Marital Status: Single    Spouse Name: N/A  . Number of Children: 2  . Years of Education: N/A   Occupational History  . Litigation Assessor [place]   Social History Main Topics  . Smoking status: Smoker, Current Status Unknown -- 0.30 packs/day for 34 years    Types: Cigarettes  . Smokeless tobacco: Never Used     Comment: 7 cigs daily 08/20/13  . Alcohol Use: 0.6 oz/week    1 Glasses of wine per week     Comment: white wine  . Drug Use: No  . Sexual Activity: Yes    Birth Control/ Protection: Surgical   Other Topics Concern  . 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  HENT: Positive for congestion and rhinorrhea.   Respiratory: Negative for cough, chest tightness, shortness of breath and wheezing.   Cardiovascular: Negative for chest pain and leg swelling.       Objective:   Physical Exam Physical Exam  Constitutional: Oriented to person, place, and time. She appears well-developed and well-nourished.  HENT:  Head: Normocephalic and atraumatic.  Eyes: Conjunctivae are normal.  Neck: Normal range of motion. Neck supple.  Cardiovascular: Normal rate, regular rhythm and normal heart sounds.   Pulmonary/Chest: Effort normal and breath sounds normal.  Musculoskeletal: Normal range of motion.  Neurological: Alert and oriented to person, place, and time.  Skin: Skin is warm and dry.  Psychiatric: Normal mood and affect. Behavior is normal. Judgment and thought content normal.  Vitals reviewed.    BP 106/68 mmHg  Pulse 74  Temp(Src) 98.9 F (37.2 C) (Oral)  Resp 16  Ht 5\' 4"  (1.626 m)  Wt 161 lb 12.8 oz (73.392 kg)  BMI 27.76 kg/m2  SpO2 96% Wt Readings from Last 3 Encounters:  02/16/16 161 lb 12.8 oz (73.392 kg)  12/30/15  166 lb 12.8 oz (75.66 kg)  08/27/15 164 lb 3.2 oz (74.481 kg)       Assessment & Plan:  1. Allergic rhinitis, unspecified allergic rhinitis type - has been on Zyrtec for many years, will try different long acting antihistamine and add back nasonex. - loratadine (CLARITIN) 10 MG tablet; Take 1 tablet (10 mg total) by mouth daily.  Dispense: 90 tablet; Refill: 3 - mometasone (NASONEX) 50 MCG/ACT nasal spray; Place 2 sprays into the nose daily.  Dispense: 17 g; Refill: 12  2. Essential hypertension - well controlled on amlodipine 5 mg - encouraged continued healthy eating and exercise.  - Basic metabolic panel  -follow up in 6 months for CPE  Clarene Reamer, FNP-BC  [facility]  02/16/2016 4:08 PM

## 2016-02-17 LAB — BASIC METABOLIC PANEL
BUN: 19 mg/dL (ref 7–25)
CHLORIDE: 106 mmol/L (ref 98–110)
CO2: 28 mmol/L (ref 20–31)
CREATININE: 0.96 mg/dL (ref 0.50–0.99)
Calcium: 9.6 mg/dL (ref 8.6–10.4)
Glucose, Bld: 82 mg/dL (ref 65–99)
POTASSIUM: 4.5 mmol/L (ref 3.5–5.3)
Sodium: 142 mmol/L (ref 135–146)

## 2016-03-04 ENCOUNTER — Other Ambulatory Visit: Payer: Self-pay

## 2016-03-04 MED ORDER — SIMVASTATIN 20 MG PO TABS: 20.0000 mg | ORAL_TABLET | Freq: Every day | ORAL | Status: DC

## 2016-04-13 ENCOUNTER — Encounter (INDEPENDENT_AMBULATORY_CARE_PROVIDER_SITE_OTHER): Payer: BLUE CROSS/BLUE SHIELD | Admitting: Ophthalmology

## 2016-04-13 DIAGNOSIS — H34832 Tributary (branch) retinal vein occlusion, left eye, with macular edema: Secondary | ICD-10-CM

## 2016-04-13 DIAGNOSIS — H43813 Vitreous degeneration, bilateral: Secondary | ICD-10-CM

## 2016-04-13 DIAGNOSIS — H35033 Hypertensive retinopathy, bilateral: Secondary | ICD-10-CM

## 2016-04-13 DIAGNOSIS — I10 Essential (primary) hypertension: Secondary | ICD-10-CM | POA: Diagnosis not present

## 2016-04-19 ENCOUNTER — Ambulatory Visit (INDEPENDENT_AMBULATORY_CARE_PROVIDER_SITE_OTHER): Payer: BLUE CROSS/BLUE SHIELD | Admitting: Family Medicine

## 2016-04-19 ENCOUNTER — Encounter: Payer: Self-pay | Admitting: Family Medicine

## 2016-04-19 VITALS — BP 103/64 | HR 71 | Temp 99.0°F | Resp 16 | Ht 64.0 in | Wt 159.0 lb

## 2016-04-19 DIAGNOSIS — S63502A Unspecified sprain of left wrist, initial encounter: Secondary | ICD-10-CM | POA: Diagnosis not present

## 2016-04-19 NOTE — Progress Notes (Signed)
Subjective:    Patient ID: Tiffany Gonzales, female    DOB: [birth year], 60 y.o.   MRN: [id number]  HPI This is a pleasant 60 yo female who presents today with left wrist pain, started about 1 month ago. 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 She has not taken any medication for it. Pain constant, achy. No swelling, feels a little stiff. No redness. No numbness or tingling in hand or fingers. Dominant hand is right hand.   Seasonal allergy symptoms improved.   Increased stress with dealing with her mother who is 35. Her mother lives alone about 1 block away from patient. The patient had to take her mother's driver's license away.   Past Medical History  Diagnosis Date  . Depression   . Panic attacks   . Hypertension   . Fibroids   . Colon polyps   . Atypical chest pain 01/2007    negative stress test  . Allergy     seasonal  . Hyperlipidemia    Past Surgical History  Procedure Laterality Date  . Abdominal hysterectomy  1998  . Nasal sinus surgery  2007    Dr Mikey Bussing out area to help breath   Family History  Problem Relation Age of Onset  . Arthritis Mother   . Hypertension Father   . Heart disease Maternal Grandmother   . Heart disease Paternal Grandmother    Social History  Substance Use Topics  . Smoking status: Smoker, Current Status Unknown -- 0.30 packs/day for 34 years    Types: Cigarettes  . Smokeless tobacco: Never Used     Comment: 7 cigs daily 08/20/13  . Alcohol Use: 0.6 oz/week    1 Glasses of wine per week     Comment: white wine      Review of Systems No chest pain, no SOB, no edema, + left wrist pain    Objective:   Physical Exam  Constitutional: She is oriented to person, place, and time. She appears well-developed and well-nourished. No distress.  HENT:  Head: Normocephalic and atraumatic.  Eyes: Conjunctivae are normal.  Cardiovascular: Normal rate and regular rhythm.   Pulmonary/Chest: Effort normal.    Musculoskeletal:       Left wrist: She exhibits bony tenderness. She exhibits normal range of motion, no tenderness, no swelling, no effusion, no crepitus and no deformity.  Mild diffisue tenderness of dorsum of left wrist.   Neurological: She is alert and oriented to person, place, and time.  Skin: Skin is warm and dry. She is not diaphoretic.  Psychiatric: She has a normal mood and affect. Her behavior is normal. Judgment and thought content normal.  Vitals reviewed.     BP 103/64 mmHg  Pulse 71  Temp(Src) 99 F (37.2 C) (Oral)  Resp 16  Ht 5\' 4"  (1.626 m)  Wt 159 lb (72.122 kg)  BMI 27.28 kg/m2 Wt Readings from Last 3 Encounters:  04/19/16 159 lb (72.122 kg)  02/16/16 161 lb 12.8 oz (73.392 kg)  12/30/15 166 lb 12.8 oz (75.66 kg)       Assessment & Plan:  1. Left wrist sprain, initial encounter - low dose NSAID for 4-5 days- ibuprofen 400 mg BID - wrist brace during day, remove several times a day for ROM - ice/heat for comfort - if no improvement in 2 weeks, referral to hand surgeon   Clarene Reamer, FNP-BC  [facility]  04/19/2016 3:30 PM

## 2016-04-19 NOTE — Patient Instructions (Addendum)
We recommend that you schedule a mammogram for breast cancer screening. Typically, you do not need a referral to do this. Please contact a local imaging center to schedule your mammogram.  [facility] ([place]) - [phone] or [phone]) [phone]   Please take ibuprofen 200 mg, 2 tablets twice a day for 4-5 days to see if it helps   IF you received an x-ray today, you will receive an invoice from [facility]. Please contact [facility] at [phone] with questions or concerns regarding your invoice.   IF you received labwork today, you will receive an invoice from [employer]. Please contact [facility] at [phone] with questions or concerns regarding your invoice.   Our billing staff will not be able to assist you with questions regarding bills from these companies.  You will be contacted with the lab results as soon as they are available. The fastest way to get your results is to activate your My Chart account. Instructions are located on the last page of this paperwork. If you have not heard from [place] regarding the results in 2 weeks, please contact this office.

## 2016-05-10 ENCOUNTER — Other Ambulatory Visit: Payer: Self-pay | Admitting: Internal Medicine

## 2016-05-10 DIAGNOSIS — Z1231 Encounter for screening mammogram for malignant neoplasm of breast: Secondary | ICD-10-CM

## 2016-05-18 ENCOUNTER — Ambulatory Visit
Admission: RE | Admit: 2016-05-18 | Discharge: 2016-05-18 | Disposition: A | Discharge status: Home / Self Care | Payer: BLUE CROSS/BLUE SHIELD | Source: Ambulatory Visit | Attending: Internal Medicine | Admitting: Internal Medicine

## 2016-05-18 DIAGNOSIS — Z1231 Encounter for screening mammogram for malignant neoplasm of breast: Secondary | ICD-10-CM

## 2016-06-13 ENCOUNTER — Encounter (INDEPENDENT_AMBULATORY_CARE_PROVIDER_SITE_OTHER): Payer: BLUE CROSS/BLUE SHIELD | Admitting: Ophthalmology

## 2016-06-13 DIAGNOSIS — H35033 Hypertensive retinopathy, bilateral: Secondary | ICD-10-CM | POA: Diagnosis not present

## 2016-06-13 DIAGNOSIS — H43813 Vitreous degeneration, bilateral: Secondary | ICD-10-CM

## 2016-06-13 DIAGNOSIS — I10 Essential (primary) hypertension: Secondary | ICD-10-CM

## 2016-06-13 DIAGNOSIS — H34832 Tributary (branch) retinal vein occlusion, left eye, with macular edema: Secondary | ICD-10-CM | POA: Diagnosis not present

## 2016-08-26 ENCOUNTER — Ambulatory Visit: Payer: 59 | Admitting: Adult Health

## 2016-09-08 ENCOUNTER — Other Ambulatory Visit: Payer: Self-pay | Admitting: Dermatology

## 2016-09-16 ENCOUNTER — Encounter (INDEPENDENT_AMBULATORY_CARE_PROVIDER_SITE_OTHER): Payer: BLUE CROSS/BLUE SHIELD | Admitting: Ophthalmology

## 2016-09-16 DIAGNOSIS — H43813 Vitreous degeneration, bilateral: Secondary | ICD-10-CM | POA: Diagnosis not present

## 2016-09-16 DIAGNOSIS — I10 Essential (primary) hypertension: Secondary | ICD-10-CM | POA: Diagnosis not present

## 2016-09-16 DIAGNOSIS — H35033 Hypertensive retinopathy, bilateral: Secondary | ICD-10-CM | POA: Diagnosis not present

## 2016-09-16 DIAGNOSIS — H348322 Tributary (branch) retinal vein occlusion, left eye, stable: Secondary | ICD-10-CM

## 2016-09-19 ENCOUNTER — Telehealth: Payer: Self-pay

## 2016-09-19 NOTE — Telephone Encounter (Signed)
Pt says that her pharmacy has sent [place] requests for her simvastatin with no response.  I do not see that anything electronic has been sent.  Pt will call them back to make sure they are escribing and not faxing it.  Please fill asap.  [phone]

## 2016-09-20 ENCOUNTER — Other Ambulatory Visit: Payer: Self-pay | Admitting: Emergency Medicine

## 2016-09-20 MED ORDER — SIMVASTATIN 20 MG PO TABS: 20.0000 mg | ORAL_TABLET | Freq: Every day | ORAL | 1 refills | Status: DC

## 2016-09-20 NOTE — Telephone Encounter (Signed)
Simvastatin 20 mg tab called in to pharmacy for #90, 3 refills Pt is scheduled for office visit 11/2

## 2016-09-22 ENCOUNTER — Ambulatory Visit: Payer: BLUE CROSS/BLUE SHIELD

## 2016-10-15 ENCOUNTER — Emergency Department (HOSPITAL_BASED_OUTPATIENT_CLINIC_OR_DEPARTMENT_OTHER)
Admission: EM | Admit: 2016-10-15 | Discharge: 2016-10-15 | Disposition: A | Discharge status: Home / Self Care | Payer: BLUE CROSS/BLUE SHIELD | Attending: Emergency Medicine | Admitting: Emergency Medicine

## 2016-10-15 ENCOUNTER — Encounter (HOSPITAL_BASED_OUTPATIENT_CLINIC_OR_DEPARTMENT_OTHER): Payer: Self-pay | Admitting: Adult Health

## 2016-10-15 DIAGNOSIS — L509 Urticaria, unspecified: Secondary | ICD-10-CM | POA: Insufficient documentation

## 2016-10-15 DIAGNOSIS — Z7982 Long term (current) use of aspirin: Secondary | ICD-10-CM | POA: Insufficient documentation

## 2016-10-15 DIAGNOSIS — I10 Essential (primary) hypertension: Secondary | ICD-10-CM | POA: Insufficient documentation

## 2016-10-15 DIAGNOSIS — R220 Localized swelling, mass and lump, head: Secondary | ICD-10-CM | POA: Diagnosis present

## 2016-10-15 DIAGNOSIS — F17210 Nicotine dependence, cigarettes, uncomplicated: Secondary | ICD-10-CM | POA: Diagnosis not present

## 2016-10-15 MED ORDER — PREDNISONE 50 MG PO TABS
60.0000 mg | ORAL_TABLET | Freq: Once | ORAL | Status: AC
  Administered 2016-10-15: 60 mg via ORAL
  Filled 2016-10-15: qty 1

## 2016-10-15 MED ORDER — DIPHENHYDRAMINE HCL 25 MG PO CAPS
50.0000 mg | ORAL_CAPSULE | Freq: Once | ORAL | Status: AC
  Administered 2016-10-15: 50 mg via ORAL
  Filled 2016-10-15: qty 2

## 2016-10-15 MED ORDER — DIPHENHYDRAMINE HCL 25 MG PO CAPS: 25.0000 mg | ORAL_CAPSULE | Freq: Four times a day (QID) | ORAL | 0 refills | Status: DC | PRN

## 2016-10-15 MED ORDER — FAMOTIDINE 20 MG PO TABS: 20.0000 mg | ORAL_TABLET | Freq: Two times a day (BID) | ORAL | 0 refills | Status: DC

## 2016-10-15 MED ORDER — FAMOTIDINE 20 MG PO TABS
20.0000 mg | ORAL_TABLET | Freq: Once | ORAL | Status: AC
  Administered 2016-10-15: 20 mg via ORAL
  Filled 2016-10-15: qty 1

## 2016-10-15 MED ORDER — PREDNISONE 20 MG PO TABS: 40.0000 mg | ORAL_TABLET | Freq: Every day | ORAL | 0 refills | Status: DC

## 2016-10-15 NOTE — ED Notes (Signed)
Pt given d/c instructions as per chart. Verbalizes understanding. No questions. Rx x 3 

## 2016-10-15 NOTE — ED Triage Notes (Signed)
Presents with urticaria that began yesterday, denies SOB, throat painor swelling, nausea. Widespread urticaria and itching noted. Breath sounds [place]. denis recent hotel stay, denies new deterergent or new soaps.

## 2016-10-15 NOTE — ED Notes (Signed)
Pt states she may have used some new body wash the other day. Presents with welts to body. +itching. No SHOB or other s/s.

## 2016-10-15 NOTE — ED Notes (Signed)
ED PA at [facility] 

## 2016-10-15 NOTE — Discharge Instructions (Signed)
Please read and follow all provided instructions.  Your diagnoses today include:  1. Urticaria     Tests performed today include:  Vital signs. See below for your results today.   Medications prescribed:   Benadryl (diphenhydramine) - antihistamine  You can find this medication over-the-counter.   DO NOT exceed:   50mg  Benadryl every 6 hours    Benadryl will make you drowsy. DO NOT drive or perform any activities that require you to be awake and alert if taking this.   Pepcid (famotidine) - antihistamine  You can find this medication over-the-counter.   DO NOT exceed:   20mg  Pepcid every 12 hours   Prednisone - steroid medicine   It is best to take this medication in the morning to prevent sleeping problems. If you are diabetic, monitor your blood sugar closely and stop taking Prednisone if blood sugar is over 300. Take with food to prevent stomach upset.    Take any prescribed medications only as directed.  Home care instructions:   Follow any educational materials contained in this packet  Follow-up instructions: Please follow-up with your primary care provider in the next 3 days for further evaluation of your symptoms.   Return instructions:   Please return to the Emergency Department if you experience worsening symptoms.   Call 9-1-1 immediately if you have an allergic reaction that involves your lips, mouth, throat or if you have any difficulty breathing. This is a life-threatening emergency.   Please return if you have any other emergent concerns.  Additional Information:  Your vital signs today were: BP 137/84 (BP Location: Left Arm)    Pulse 71    Temp 98.1 F (36.7 C) (Oral)    Resp 18    Ht 5\' 5"  (1.651 m)    Wt 69.9 kg    SpO2 100%    BMI 25.63 kg/m  If your blood pressure (BP) was elevated above 135/85 this visit, please have this repeated by your doctor within one month. --------------

## 2016-10-15 NOTE — ED Provider Notes (Signed)
[facility] DEPT [facility] Provider Note   CSN: [id number] Arrival date & time: 10/15/16  2158  By signing my name below, I, Emmanuella Mensah, attest that this documentation has been prepared under the direction and in the presence of Carlisle Cater, PA-C. Electronically Signed: Judithann Sauger, ED Scribe. 10/15/16. 10:40 PM.   History   Chief Complaint Chief Complaint  Patient presents with  . Urticaria    HPI Comments: Tiffany Gonzales is a 60 y.o. female with a hx of of seasonal allergies and hypertension who presents to the Emergency Department complaining of gradually worsening persistent moderately itchy generalized hives onset last night. She reports associated mild facial swelling. She denies any new lotions, soaps, or detergent. Pt has an allergy to Iodinated diagnostic agents, lisinopril, and shellfish but denies any recent contact with any. She states that she had an allergic reaction to Lisinopril with facial swelling last year and expresses concerns of possible developing an allergy to Amlodipine. No alleviating factors noted. Pt has not tried any medications PTA. She denies any abdominal cramping, nausea, vomiting, shortness of breath, tongue/throat swelling, sore throat, difficulty swallowing, or any other symptoms.   The history is provided by the patient. No language interpreter was used.    Past Medical History:  Diagnosis Date  . Allergy    seasonal  . Atypical chest pain 01/2007   negative stress test  . Colon polyps   . Depression   . Fibroids   . Hyperlipidemia   . Hypertension   . Panic attacks     Patient Active Problem List   Diagnosis Date Noted  . Breast abscess 04/08/2015  . Memory loss 12/09/2013  . History of insect sting allergy 11/18/2012  . Post-menopause atrophic vaginitis 09/03/2012  . Allergic reaction 06/05/2012  . OSA (obstructive sleep apnea) 11/08/2011  . Cervical disc disease 06/28/2011  . TOBACCO ABUSE 03/01/2010  . LEUKOPENIA,  MILD 12/10/2008  . HYPERCHOLESTEROLEMIA 12/09/2008  . Essential hypertension 03/05/2008  . DEPRESSIVE DISORDER 11/13/2007    Past Surgical History:  Procedure Laterality Date  . ABDOMINAL HYSTERECTOMY  1998  . CATARACT EXTRACTION    . NASAL SINUS SURGERY  2007   Dr Mikey Bussing out area to help breath    OB History    No data available       Home Medications    Prior to Admission medications   Medication Sig Start Date End Date Taking? Authorizing Provider  amLODipine (NORVASC) 5 MG tablet Take 1 tablet (5 mg total) by mouth daily. 12/01/15   Wardell Honour, MD  aspirin 81 MG tablet Take 81 mg by mouth daily.      Historical Provider, MD  cetirizine (ZYRTEC) 10 MG tablet Take 1 tablet (10 mg total) by mouth daily. 01/08/15   Roselee Culver, MD  EPINEPHrine 0.3 mg/0.3 mL IJ SOAJ injection Inject 0.3 mLs (0.3 mg total) into the muscle once. 08/19/15   Elby Beck, FNP  fluticasone (FLONASE) 50 MCG/ACT nasal spray Place 2 sprays into both nostrils daily. 08/19/15   Elby Beck, FNP  ketorolac Nancie Neas) 0.5 % ophthalmic solution Reported on 02/16/2016 01/27/16   Historical Provider, MD  loratadine (CLARITIN) 10 MG tablet Take 1 tablet (10 mg total) by mouth daily. 02/16/16   Elby Beck, FNP  mometasone (NASONEX) 50 MCG/ACT nasal spray Place 2 sprays into the nose daily. 02/16/16   Elby Beck, FNP  montelukast (SINGULAIR) 10 MG tablet Take 1 tablet (10 mg total) by mouth at  bedtime. 12/01/15   Wardell Honour, MD  ofloxacin (OCUFLOX) 0.3 % ophthalmic solution INT 1 GTT IN OU QID STARTING 72 H PRIOR TO SURGERY 01/27/16   Historical Provider, MD  prednisoLONE acetate (PRED FORTE) 1 % ophthalmic suspension INT 1 GTT INTO OD QID . START AFTER SURGERY 01/27/16   Historical Provider, MD  simvastatin (ZOCOR) 20 MG tablet Take 1 tablet (20 mg total) by mouth at bedtime. 09/20/16   Wardell Honour, MD    Family History Family History  Problem Relation Age of Onset  .  Arthritis Mother   . Hypertension Father   . Heart disease Maternal Grandmother   . Heart disease Paternal Grandmother     Social History Social History  Substance Use Topics  . Smoking status: Current Every Day Smoker    Packs/day: 0.30    Years: 34.00    Types: Cigarettes  . Smokeless tobacco: Never Used     Comment: 7 cigs daily 08/20/13  . Alcohol use 0.6 oz/week    1 Glasses of wine per week     Comment: white wine     Allergies   Iodinated diagnostic agents; Lisinopril; and Shellfish allergy   Review of Systems Review of Systems  Constitutional: Negative for fever.  HENT: Negative for facial swelling, sore throat and trouble swallowing.   Eyes: Negative for redness.  Respiratory: Negative for shortness of breath, wheezing and stridor.   Cardiovascular: Negative for chest pain.  Gastrointestinal: Negative for abdominal pain, nausea and vomiting.  Musculoskeletal: Negative for myalgias.  Skin: Positive for rash.  Neurological: Negative for light-headedness.  Psychiatric/Behavioral: Negative for confusion.     Physical Exam Updated Vital Signs BP 137/84 (BP Location: Left Arm)   Pulse 71   Temp 98.1 F (36.7 C) (Oral)   Resp 18   Ht 5\' 5"  (1.651 m)   Wt 154 lb (69.9 kg)   SpO2 100%   BMI 25.63 kg/m   Physical Exam  Constitutional: She appears well-developed and well-nourished.  HENT:  Head: Normocephalic and atraumatic.  No angioedema.  Eyes: Conjunctivae are normal. Right eye exhibits no discharge. Left eye exhibits no discharge.  Neck: Normal range of motion. Neck supple.  Cardiovascular: Normal rate, regular rhythm and normal heart sounds.   Pulmonary/Chest: Effort normal and breath sounds normal.  Abdominal: Soft. There is no tenderness.  Neurological: She is alert.  Skin: Skin is warm and dry.  Psychiatric: She has a normal mood and affect.  Nursing note and vitals reviewed.    ED Treatments / Results  DIAGNOSTIC STUDIES: Oxygen  Saturation is 100% on RA, normal by my interpretation.    COORDINATION OF CARE: 10:27 PM- Pt advised of plan for treatment and pt agrees. Pt will receive Benadryl, Pepcid, and Prednisone. Advised pt to follow up with PCP if she has recurrent episodes for possible allergy testing.   Procedures Procedures (including critical care time)  Medications Ordered in ED Medications  diphenhydrAMINE (BENADRYL) capsule 50 mg (50 mg Oral Given 10/15/16 2239)  famotidine (PEPCID) tablet 20 mg (20 mg Oral Given 10/15/16 2240)  predniSONE (DELTASONE) tablet 60 mg (60 mg Oral Given 10/15/16 2241)     Initial Impression / Assessment and Plan / ED Course  Carlisle Cater, PA-C has reviewed the triage vital signs and the nursing notes.  Pertinent labs & imaging results that were available during my care of the patient were reviewed by me and considered in my medical decision making (see chart for details).  Clinical Course    Vital signs reviewed and are as follows: Vitals:   10/15/16 2208  BP: 137/84  Pulse: 71  Resp: 18  Temp: 98.1 F (36.7 C)   Encouraged to return with worsening symptoms, difficult to breathing, syncope, vomiting, or other concerns. Patient verbalizes understanding and agrees with the plan. Encouraged PCP follow-up in the next several days for further testing if eyes do not improve.  Final Clinical Impressions(s) / ED Diagnoses   Final diagnoses:  Urticaria   Patient with urticaria, Unknown etiology. Treatment as above. No signs of anaphylaxis.  New Prescriptions Discharge Medication List as of 10/15/2016 10:35 PM    START taking these medications   Details  diphenhydrAMINE (BENADRYL) 25 mg capsule Take 1 capsule (25 mg total) by mouth every 6 (six) hours as needed., Starting Sat 10/15/2016, Print    famotidine (PEPCID) 20 MG tablet Take 1 tablet (20 mg total) by mouth 2 (two) times daily., Starting Sat 10/15/2016, Print    predniSONE (DELTASONE) 20 MG tablet Take 2  tablets (40 mg total) by mouth daily., Starting Sat 10/15/2016, Print       I personally performed the services described in this documentation, which was scribed in my presence. The recorded information has been reviewed and is accurate.    Carlisle Cater, PA-C 10/15/16 [id number]    Charlesetta Shanks, MD 10/17/16 [phone]

## 2016-10-24 ENCOUNTER — Ambulatory Visit (INDEPENDENT_AMBULATORY_CARE_PROVIDER_SITE_OTHER): Payer: BLUE CROSS/BLUE SHIELD | Admitting: Family Medicine

## 2016-10-24 VITALS — BP 116/74 | HR 80 | Temp 98.7°F | Resp 17 | Ht 65.0 in | Wt 158.0 lb

## 2016-10-24 DIAGNOSIS — L500 Allergic urticaria: Secondary | ICD-10-CM | POA: Diagnosis not present

## 2016-10-24 MED ORDER — PREDNISONE 10 MG PO TABS: ORAL_TABLET | ORAL | 0 refills | Status: DC

## 2016-10-24 NOTE — Patient Instructions (Addendum)
     IF you received an x-ray today, you will receive an invoice from [facility]. Please contact [facility] at [phone] with questions or concerns regarding your invoice.   IF you received labwork today, you will receive an invoice from [employer]. Please contact [facility] at [phone] with questions or concerns regarding your invoice.   Our billing staff will not be able to assist you with questions regarding bills from these companies.  You will be contacted with the lab results as soon as they are available. The fastest way to get your results is to activate your My Chart account. Instructions are located on the last page of this paperwork. If you have not heard from [place] regarding the results in 2 weeks, please contact this office.      Angioedema Angioedema is sudden swelling in the body. The swelling can happen in any part of the body. It often happens on the skin and causes itchy, bumpy patches (hives) to form. This condition may:  Happen only one time.  Happen more than one time. It may come back at random times.  Keep coming back for a number of years. Someday it may stop coming back. Follow these instructions at home:  Take over-the-counter and prescription medicines only as told by your doctor.  If you were given medicines for emergency allergy treatment, always carry them with you.  Wear a medical bracelet as told by your doctor.  Avoid the things that cause your attacks (triggers).  If this condition was passed to you from your parents and you want to have kids, talk to your doctor. Your kids may also have this condition. Contact a doctor if:  You have another attack.  Your attacks happen more often, even after you take steps to prevent them.  This condition was passed to you by your parents and you want to have kids. Get help right away if:  Your mouth, tongue, or lips get very swollen.  You have trouble  breathing.  You have trouble swallowing.  You pass out (faint). This information is not intended to replace advice given to you by your health care provider. Make sure you discuss any questions you have with your health care provider. Document Released: 10/26/2009 Document Revised: 06/08/2016 Document Reviewed: 05/17/2016 Elsevier Interactive Patient Education  2017 [employer].

## 2016-10-24 NOTE — Progress Notes (Signed)
Chief Complaint  Patient presents with  . Rash    Itchy. all over. Was given prednisone at ED last week, symptoms have returned.     HPI Pt reports that she was seen in there ER for urticaria She reports that she has been taking pepcid, benadryl and prednisone 40mg  for 4 days and while she was on the prednisone for 4 days the wheals got better.  She reports that her symptoms restarted after 3-4 days of relief. She stopped the benadryl and pepcid.   She reports that last night she took benadryl for the itching.  She is taking her singulair.  Past Medical History:  Diagnosis Date  . Allergy    seasonal  . Atypical chest pain 01/2007   negative stress test  . Colon polyps   . Depression   . Fibroids   . Hyperlipidemia   . Hypertension   . Panic attacks     Current Outpatient Prescriptions  Medication Sig Dispense Refill  . amLODipine (NORVASC) 5 MG tablet Take 1 tablet (5 mg total) by mouth daily. 90 tablet 3  . aspirin 81 MG tablet Take 81 mg by mouth daily.      . cetirizine (ZYRTEC) 10 MG tablet Take 1 tablet (10 mg total) by mouth daily. 30 tablet 12  . diphenhydrAMINE (BENADRYL) 25 mg capsule Take 1 capsule (25 mg total) by mouth every 6 (six) hours as needed. 6 capsule 0  . EPINEPHrine 0.3 mg/0.3 mL IJ SOAJ injection Inject 0.3 mLs (0.3 mg total) into the muscle once. 1 Device 1  . famotidine (PEPCID) 20 MG tablet Take 1 tablet (20 mg total) by mouth 2 (two) times daily. 30 tablet 0  . fluticasone (FLONASE) 50 MCG/ACT nasal spray Place 2 sprays into both nostrils daily. 16 g 6  . mometasone (NASONEX) 50 MCG/ACT nasal spray Place 2 sprays into the nose daily. 17 g 12  . montelukast (SINGULAIR) 10 MG tablet Take 1 tablet (10 mg total) by mouth at bedtime. 90 tablet 3  . simvastatin (ZOCOR) 20 MG tablet Take 1 tablet (20 mg total) by mouth at bedtime. 90 tablet 1  . predniSONE (DELTASONE) 10 MG tablet Take 6 tablets on day 1, 5 tablets on day 2, 4 tablets on day 3, 3 tablets  on day 4, 2 tablets on day 5, 1 tablet on day 6 and stop on day 7 21 tablet 0   No current facility-administered medications for this visit.     Allergies:  Allergies  Allergen Reactions  . Iodinated Diagnostic Agents Shortness Of Breath  . Lisinopril Hives  . Shellfish Allergy     Past Surgical History:  Procedure Laterality Date  . ABDOMINAL HYSTERECTOMY  1998  . CATARACT EXTRACTION    . NASAL SINUS SURGERY  2007   Dr Mikey Bussing out area to help breath    Social History   Social History  . Marital status: Single    Spouse name: N/A  . Number of children: 2  . Years of education: N/A   Occupational History  . 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 Smoking status: Current Every Day Smoker    Packs/day: 0.30    Years: 34.00    Types: Cigarettes  . Smokeless tobacco: Never Used     Comment: 7 cigs daily 08/20/13  . Alcohol use 0.6 oz/week    1 Glasses of wine per week     Comment: white wine  . Drug  use: No  . Sexual activity: Yes    Birth control/ protection: Surgical   Other Topics Concern  . 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  HENT: Negative for ear discharge, ear pain, hearing loss, sore throat and tinnitus.        Itching inside her ears  Respiratory: Negative for cough, shortness of breath, wheezing and stridor.   Cardiovascular: Negative for chest pain, palpitations and leg swelling.  Gastrointestinal: Negative for abdominal pain, constipation, diarrhea, nausea and vomiting.  Genitourinary:       Itching in her vagina  Skin: Positive for itching. Negative for rash.  Neurological: Negative for tingling, tremors and headaches.  Psychiatric/Behavioral: The patient is not nervous/anxious and does not have insomnia.     Objective: Vitals:   10/24/16 1454  BP: 116/74    Pulse: 80  Resp: 17  Temp: 98.7 F (37.1 C)  TempSrc: Oral  SpO2: 99%  Weight: 158 lb (71.7 kg)  Height: 5\' 5"  (1.651 m)    Physical Exam General: alert, oriented, in NAD Head: normocephalic, atraumatic, no sinus tenderness Eyes: EOM intact, no scleral icterus or conjunctival injection Ears: TM clear bilaterally Throat: no pharyngeal exudate or erythema Lymph: no posterior auricular, submental or cervical lymph adenopathy Heart: normal rate, normal sinus rhythm, no murmurs Lungs: clear to auscultation bilaterally, no wheezing Skin: small wheals on the upper torso, mild edema of the skin on the face most pronounced on the left side  Assessment and Plan Mirza was seen today for rash.  Diagnoses and all orders for this visit:  Allergic urticaria -     Allergens (22) Foods -     CBC Advised pt to take prednisone taper Will check allergen panel Discussed that after she completes prednisone she should continue pepcid and zyrtec or benadryl for an additional 2 weeks Avoid itching Use topical benadryl -     predniSONE (DELTASONE) 10 MG tablet; Take 6 tablets on day 1, 5 tablets on day 2, 4 tablets on day 3, 3 tablets on day 4, 2 tablets on day 5, 1 tablet on day 6 and stop on day 7   A total of 25 minutes were spent face-to-face with the patient during this encounter and over half of that time was spent on counseling and coordination of care.\    Marlo Arriola A [facility]

## 2016-10-26 LAB — ALLERGENS (22) FOODS IGG
Casein IgG: <2 ug/mL (ref 0.0–1.9)
Chicken IgG: <2 ug/mL (ref 0.0–1.9)
Corn IgG: 6.2 ug/mL — ABNORMAL HIGH (ref 0.0–1.9)
Egg, Whole IgG: <2 ug/mL (ref 0.0–1.9)
F004-IGG WHEAT: 3.3 ug/mL — AB (ref 0.0–1.9)
F005-IGG RYE: 3.9 ug/mL — AB (ref 0.0–1.9)
F026-IGG PORK: 3.2 ug/mL — AB (ref 0.0–1.9)
F082-IGG CHEESE, MOLD TYPE: 4.5 ug/mL — AB (ref 0.0–1.9)
F279-IGG CHILI PEPPER: 2.6 ug/mL — AB (ref 0.0–1.9)
Green Bean IgG: <2 ug/mL (ref 0.0–1.9)
Lamb IgG: <2 ug/mL (ref 0.0–1.9)
Oat IgG: 2.6 ug/mL — ABNORMAL HIGH (ref 0.0–1.9)
Onion IgG: <2 ug/mL (ref 0.0–1.9)
Shrimp IgG: <2 ug/mL (ref 0.0–1.9)
Soybean IgG: <2 ug/mL (ref 0.0–1.9)
Yeast IgG: <2 ug/mL (ref 0.0–1.9)

## 2016-10-26 LAB — CBC
HEMOGLOBIN: 13.5 g/dL (ref 11.1–15.9)
Hematocrit: 39.8 % (ref 34.0–46.6)
MCH: 30.9 pg (ref 26.6–33.0)
MCHC: 33.9 g/dL (ref 31.5–35.7)
MCV: 91 fL (ref 79–97)
Platelets: 262 10*3/uL (ref 150–379)
RBC: 4.37 x10E6/uL (ref 3.77–5.28)
RDW: 15 % (ref 12.3–15.4)
WBC: 10 10*3/uL (ref 3.4–10.8)

## 2016-10-31 ENCOUNTER — Ambulatory Visit (INDEPENDENT_AMBULATORY_CARE_PROVIDER_SITE_OTHER): Payer: BLUE CROSS/BLUE SHIELD | Admitting: Family Medicine

## 2016-10-31 VITALS — BP 110/70 | HR 70 | Temp 98.9°F | Resp 17 | Ht 65.0 in | Wt 162.0 lb

## 2016-10-31 DIAGNOSIS — L500 Allergic urticaria: Secondary | ICD-10-CM | POA: Diagnosis not present

## 2016-10-31 DIAGNOSIS — Z91013 Allergy to seafood: Secondary | ICD-10-CM | POA: Diagnosis not present

## 2016-10-31 MED ORDER — EPINEPHRINE 0.3 MG/0.3ML IJ SOAJ: 0.3000 mg | Freq: Once | INTRAMUSCULAR | 1 refills | Status: AC

## 2016-10-31 NOTE — Progress Notes (Signed)
Chief Complaint  Patient presents with  . Follow-up    urticaria/ came back after prednisone    HPI   Pt last seen for urticaria 10/24/2016 She reports that she completed the prednisone as prescribed and the urticaria came back She was given a prednisone taper for 7 days with plan to continue pepcid, zyrtec and an allergen panel was checked.  Pt reports that she had a fruit smoothie this morning  Her allergy panel included- wheat, rye, oat, pork, chili pepper, corn, cheese   Past Medical History:  Diagnosis Date  . Allergy    seasonal  . Atypical chest pain 01/2007   negative stress test  . Colon polyps   . Depression   . Fibroids   . Hyperlipidemia   . Hypertension   . Panic attacks     Current Outpatient Prescriptions  Medication Sig Dispense Refill  . amLODipine (NORVASC) 5 MG tablet Take 1 tablet (5 mg total) by mouth daily. 90 tablet 3  . aspirin 81 MG tablet Take 81 mg by mouth daily.      . cetirizine (ZYRTEC) 10 MG tablet Take 1 tablet (10 mg total) by mouth daily. 30 tablet 12  . diphenhydrAMINE (BENADRYL) 25 mg capsule Take 1 capsule (25 mg total) by mouth every 6 (six) hours as needed. 6 capsule 0  . EPINEPHrine 0.3 mg/0.3 mL IJ SOAJ injection Inject 0.3 mLs (0.3 mg total) into the muscle once. 1 Device 1  . famotidine (PEPCID) 20 MG tablet Take 1 tablet (20 mg total) by mouth 2 (two) times daily. 30 tablet 0  . fluticasone (FLONASE) 50 MCG/ACT nasal spray Place 2 sprays into both nostrils daily. 16 g 6  . mometasone (NASONEX) 50 MCG/ACT nasal spray Place 2 sprays into the nose daily. 17 g 12  . montelukast (SINGULAIR) 10 MG tablet Take 1 tablet (10 mg total) by mouth at bedtime. 90 tablet 3  . simvastatin (ZOCOR) 20 MG tablet Take 1 tablet (20 mg total) by mouth at bedtime. 90 tablet 1   No current facility-administered medications for this visit.     Allergies:  Allergies  Allergen Reactions  . Iodinated Diagnostic Agents Shortness Of Breath  . Lisinopril  Hives  . Shellfish Allergy     Past Surgical History:  Procedure Laterality Date  . ABDOMINAL HYSTERECTOMY  1998  . CATARACT EXTRACTION    . NASAL SINUS SURGERY  2007   Dr Mikey Bussing out area to help breath    Social History   Social History  . Marital status: Single    Spouse name: N/A  . Number of children: 2  . Years of education: N/A   Occupational History  . 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 Smoking status: Current Every Day Smoker    Packs/day: 0.30    Years: 34.00    Types: Cigarettes  . Smokeless tobacco: Never Used     Comment: 7 cigs daily 08/20/13  . Alcohol use 0.6 oz/week    1 Glasses of wine per week     Comment: white wine  . Drug use: No  . Sexual activity: Yes    Birth control/ protection: Surgical   Other Topics Concern  . 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   Divorced   2 children    ROS  Objective: Vitals:   10/31/16 1427  BP: 110/70  Pulse: 70  Resp: 17  Temp: 98.9 F (37.2 C)  TempSrc: Oral  SpO2: 100%  Weight: 162 lb (73.5 kg)  Height: 5\' 5"  (1.651 m)    Physical Exam General: alert, oriented, in NAD Head: normocephalic, atraumatic, no sinus tenderness Eyes: EOM intact, no scleral icterus or conjunctival injection Ears: TM clear bilaterally Throat: no pharyngeal exudate or erythema Lymph: no posterior auricular, submental or cervical lymph adenopathy Heart: normal rate, normal sinus rhythm, no murmurs Lungs: clear to auscultation bilaterally, no wheezing Skin: wheals noted on face and chest  Assessment and Plan Yania was seen today for follow-up.  Diagnoses and all orders for this visit:  Allergic urticaria- continue current meds with pepcid, benadryl and zyrtec Refilled epi pen Follow up with Allergy Discussed that she should not get testing while having the reaction and discuss mgmt with Allergy -     Ambulatory  referral to Allergy  Allergy to shellfish -     EPINEPHrine 0.3 mg/0.3 mL IJ SOAJ injection; Inject 0.3 mLs (0.3 mg total) into the muscle once.     [place]

## 2016-10-31 NOTE — Patient Instructions (Signed)
     IF you received an x-ray today, you will receive an invoice from [facility]. Please contact [facility] at [phone] with questions or concerns regarding your invoice.   IF you received labwork today, you will receive an invoice from [facility]. Please contact [facility] at [phone] with questions or concerns regarding your invoice.   Our billing staff will not be able to assist you with questions regarding bills from these companies.  You will be contacted with the lab results as soon as they are available. The fastest way to get your results is to activate your My Chart account. Instructions are located on the last page of this paperwork. If you have not heard from us regarding the results in 2 weeks, please contact this office.      

## 2017-01-13 ENCOUNTER — Ambulatory Visit (INDEPENDENT_AMBULATORY_CARE_PROVIDER_SITE_OTHER): Payer: BLUE CROSS/BLUE SHIELD | Admitting: Family Medicine

## 2017-01-13 VITALS — BP 128/82 | HR 70 | Temp 98.8°F | Resp 16 | Ht 64.25 in | Wt 157.0 lb

## 2017-01-13 DIAGNOSIS — K208 Other esophagitis without bleeding: Secondary | ICD-10-CM

## 2017-01-13 DIAGNOSIS — T50905A Adverse effect of unspecified drugs, medicaments and biological substances, initial encounter: Secondary | ICD-10-CM

## 2017-01-13 DIAGNOSIS — D125 Benign neoplasm of sigmoid colon: Secondary | ICD-10-CM

## 2017-01-13 DIAGNOSIS — J302 Other seasonal allergic rhinitis: Secondary | ICD-10-CM | POA: Diagnosis not present

## 2017-01-13 DIAGNOSIS — Z0000 Encounter for general adult medical examination without abnormal findings: Secondary | ICD-10-CM | POA: Diagnosis not present

## 2017-01-13 DIAGNOSIS — I10 Essential (primary) hypertension: Secondary | ICD-10-CM | POA: Diagnosis not present

## 2017-01-13 DIAGNOSIS — Z1211 Encounter for screening for malignant neoplasm of colon: Secondary | ICD-10-CM

## 2017-01-13 DIAGNOSIS — K635 Polyp of colon: Secondary | ICD-10-CM

## 2017-01-13 MED ORDER — MONTELUKAST SODIUM 10 MG PO TABS: 10.0000 mg | ORAL_TABLET | Freq: Every day | ORAL | 3 refills | Status: DC

## 2017-01-13 MED ORDER — AMLODIPINE BESYLATE 5 MG PO TABS: 5.0000 mg | ORAL_TABLET | Freq: Every day | ORAL | 3 refills | Status: DC

## 2017-01-13 NOTE — Progress Notes (Signed)
Chief Complaint  Patient presents with  . Annual Exam    No papsmear    Subjective:  Tiffany Gonzales is a 61 y.o. female here for a health maintenance visit.  Patient is established pt  She reports pain with swallowing at times She states that she swallows six pills every night with very little water She states that after eating she feels some chest tightness that makes her feel like she has to belch or stretch She states that she stopped taking pepcid    Patient Active Problem List   Diagnosis Date Noted  . Breast abscess 04/08/2015  . Memory loss 12/09/2013  . History of insect sting allergy 11/18/2012  . Post-menopause atrophic vaginitis 09/03/2012  . Allergic reaction 06/05/2012  . OSA (obstructive sleep apnea) 11/08/2011  . Cervical disc disease 06/28/2011  . TOBACCO ABUSE 03/01/2010  . LEUKOPENIA, MILD 12/10/2008  . HYPERCHOLESTEROLEMIA 12/09/2008  . Essential hypertension 03/05/2008  . DEPRESSIVE DISORDER 11/13/2007    Past Medical History:  Diagnosis Date  . Allergy    seasonal  . Atypical chest pain 01/2007   negative stress test  . Colon polyps   . Depression   . Fibroids   . Hyperlipidemia   . Hypertension   . Panic attacks     Past Surgical History:  Procedure Laterality Date  . ABDOMINAL HYSTERECTOMY  1998  . CATARACT EXTRACTION    . NASAL SINUS SURGERY  2007   Dr Mikey Bussing out area to help breath     Outpatient Medications Prior to Visit  Medication Sig Dispense Refill  . amLODipine (NORVASC) 5 MG tablet Take 1 tablet (5 mg total) by mouth daily. 90 tablet 3  . aspirin 81 MG tablet Take 81 mg by mouth daily.      . cetirizine (ZYRTEC) 10 MG tablet Take 1 tablet (10 mg total) by mouth daily. 30 tablet 12  . fluticasone (FLONASE) 50 MCG/ACT nasal spray Place 2 sprays into both nostrils daily. 16 g 6  . mometasone (NASONEX) 50 MCG/ACT nasal spray Place 2 sprays into the nose daily. 17 g 12  . montelukast (SINGULAIR) 10 MG tablet Take 1  tablet (10 mg total) by mouth at bedtime. 90 tablet 3  . simvastatin (ZOCOR) 20 MG tablet Take 1 tablet (20 mg total) by mouth at bedtime. 90 tablet 1  . diphenhydrAMINE (BENADRYL) 25 mg capsule Take 1 capsule (25 mg total) by mouth every 6 (six) hours as needed. (Patient not taking: Reported on 01/13/2017) 6 capsule 0  . famotidine (PEPCID) 20 MG tablet Take 1 tablet (20 mg total) by mouth 2 (two) times daily. (Patient not taking: Reported on 01/13/2017) 30 tablet 0   No facility-administered medications prior to visit.     Allergies  Allergen Reactions  . Iodinated Diagnostic Agents Shortness Of Breath  . Lisinopril Hives  . Shellfish Allergy      Family History  Problem Relation Age of Onset  . Arthritis Mother   . Hypertension Father   . Heart disease Maternal Grandmother   . Heart disease Paternal Grandmother      Health Habits: Dental Exam: not up to date Eye Exam: up to date Exercise: 4  times/week on average Current exercise activities: walking/running Diet: balanced  Social History   Social History  . Marital status: Single    Spouse name: N/A  . Number of children: 2  . Years of education: N/A   Occupational History  . Investment banker, [employer]  Social History Main Topics  . Smoking status: Current Every Day Smoker    Packs/day: 0.30    Years: 34.00    Types: Cigarettes  . Smokeless tobacco: Never Used     Comment: 7 cigs daily 08/20/13  . Alcohol use 0.6 oz/week    1 Glasses of wine per week     Comment: white wine  . Drug use: No  . Sexual activity: Yes    Birth control/ protection: Surgical   Other Topics Concern  . 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 0.6 oz/week  . 1 Glasses of wine per week    Comment: white wine   History  Smoking Status  . Current Every Day Smoker  . Packs/day: 0.30  . Years: 34.00  .  Types: Cigarettes  Smokeless Tobacco  . Never Used    Comment: 7 cigs daily 08/20/13   History  Drug Use No    GYN: Sexual Health Menstrual status: regular menses LMP: No LMP recorded. Patient has had a hysterectomy. Last pap smear: see HM section   Health Maintenance: See under health Maintenance activity for review of completion dates as well. Immunization History  Administered Date(s) Administered  . Tdap 08/19/2015      Depression Screen-PHQ2/9 Depression screen [facility] 2/9 01/13/2017 10/31/2016 10/24/2016 04/19/2016 02/16/2016  Decreased Interest 0 0 0 0 0  Down, Depressed, Hopeless 0 0 0 0 0  PHQ - 2 Score 0 0 0 0 0      Depression Severity and Treatment Recommendations:  0-4= None  5-9= Mild / Treatment: Support, educate to call if worse; return in one month  10-14= Moderate / Treatment: Support, watchful waiting; Antidepressant or Psycotherapy  15-19= Moderately severe / Treatment: Antidepressant OR Psychotherapy  >= 20 = Major depression, severe / Antidepressant AND Psychotherapy    Review of Systems   Review of Systems  Constitutional: Negative for chills, fever and weight loss.  HENT: Negative for congestion, ear discharge, ear pain, hearing loss, nosebleeds and tinnitus.   Eyes: Negative for blurred vision, double vision, photophobia, pain, discharge and redness.  Respiratory: Negative for cough, hemoptysis, sputum production, shortness of breath and wheezing.   Cardiovascular: Negative for chest pain, palpitations, orthopnea, claudication and leg swelling.       After eating she feels like she needs to stretch  Gastrointestinal: Negative for abdominal pain, constipation, diarrhea, heartburn, nausea and vomiting.       Trouble swallowing and burping.  She does not drink water with her six pills that she takes at night.   Genitourinary: Negative for dysuria, flank pain, frequency, hematuria and urgency.  Musculoskeletal: Negative for back pain, joint pain,  myalgias and neck pain.  Skin: Negative for itching and rash.  Neurological: Negative for dizziness, tingling, tremors, sensory change, speech change and headaches.  Psychiatric/Behavioral: Negative for depression. The patient is not nervous/anxious and does not have insomnia.     See HPI for ROS as well.    Objective:   Vitals:   01/13/17 0918  BP: 128/82  Pulse: 70  Resp: 16  Temp: 98.8 F (37.1 C)  TempSrc: Oral  SpO2: 99%  Weight: 157 lb (71.2 kg)  Height: 5' 4.25" (1.632 m)    Body mass index is 26.74 kg/m.  Physical Exam  Constitutional: She is oriented to person, place, and time. She appears  well-developed and well-nourished.  HENT:  Head: Normocephalic and atraumatic.  Right Ear: External ear normal.  Left Ear: External ear normal.  Nose: Nose normal.  Mouth/Throat: Oropharynx is clear and moist.  Neck: Normal range of motion. Neck supple. No thyromegaly present.  Cardiovascular: Normal rate, regular rhythm, normal heart sounds and intact distal pulses.   No murmur heard. Pulmonary/Chest: Effort normal and breath sounds normal. No respiratory distress. She has no wheezes. She has no rales.  Abdominal: Soft. Bowel sounds are normal. She exhibits no distension and no mass. There is no tenderness. There is no rebound and no guarding.  Musculoskeletal: Normal range of motion. She exhibits no edema.  Neurological: She is alert and oriented to person, place, and time. She has normal reflexes. No cranial nerve deficit.  Skin: Skin is warm. No erythema.  Psychiatric: She has a normal mood and affect. Her behavior is normal. Judgment and thought content normal.       Assessment/Plan:   Patient was seen for a health maintenance exam.  Counseled the patient on health maintenance issues. Reviewed her health mainteance schedule and ordered appropriate tests (see orders.) Counseled on regular exercise and weight management. Recommend regular eye exams and dental cleaning.    The following issues were addressed today for health maintenance:  Kaylonie was seen today for annual exam.  Diagnoses and all orders for this visit:  Health maintenance examination- no pap indicated, advised repeat cscopy, hep c screening -     Lipid panel -     Comprehensive metabolic panel -     HCV Ab w/Rflx to Verification  Multiple polyps of sigmoid colon Screen for colon cancer -   Advised follow up for screening -     HM COLONOSCOPY  Essential hypertension- bp well controlled -     amLODipine (NORVASC) 5 MG tablet; Take 1 tablet (5 mg total) by mouth daily. -     Lipid panel -     Comprehensive metabolic panel -     Microalbumin, urine  Other seasonal allergic rhinitis -     Discontinue: montelukast (SINGULAIR) 10 MG tablet; Take 1 tablet (10 mg total) by mouth at bedtime. -     montelukast (SINGULAIR) 10 MG tablet; Take 1 tablet (10 mg total) by mouth at bedtime.  Pill esophagitis- symptoms concerning for esophagitis advised lots of water Resume pepcid Chew food to a pulp If no improvement will refer for EGD   No Follow-up on file.

## 2017-01-13 NOTE — Patient Instructions (Addendum)
   IF you received an x-ray today, you will receive an invoice from [facility]. Please contact [facility] at [phone] with questions or concerns regarding your invoice.   IF you received labwork today, you will receive an invoice from [facility]. Please contact [facility] at [phone] with questions or concerns regarding your invoice.   Our billing staff will not be able to assist you with questions regarding bills from these companies.  You will be contacted with the lab results as soon as they are available. The fastest way to get your results is to activate your My Chart account. Instructions are located on the last page of this paperwork. If you have not heard from us regarding the results in 2 weeks, please contact this office.      Managing Your Hypertension Hypertension is commonly called high blood pressure. Blood pressure is a measurement of how strongly your blood is pressing against the walls of your arteries. Arteries are blood vessels that carry blood from your heart throughout your body. Blood pressure does not stay the same. It rises when you are active, excited, or nervous. It lowers when you are sleeping or relaxed. If the numbers that measure your blood pressure stay above normal most of the time, you are at risk for health problems. Hypertension is a long-term (chronic) condition in which blood pressure is elevated. This condition often has no signs or symptoms. The cause of the condition is usually not known. What are blood pressure readings? A blood pressure reading is recorded as two numbers, such as "120 over 80" (or 120/80). The first ("top") number is called the systolic pressure. It is a measure of the pressure in your arteries as the heart beats. The second ("bottom") number is called the diastolic pressure. It is a measure of the pressure in your arteries as the heart relaxes between beats. What does my blood pressure reading mean? Blood  pressure is classified into four stages. Based on your blood pressure reading, your health care provider may use the following stages to determine what type of treatment, if any, is needed. Systolic pressure and diastolic pressure are measured in a unit called mm Hg. Normal  Systolic pressure: below 120.  Diastolic pressure: below 80. Prehypertension  Systolic pressure: 120-139.  Diastolic pressure: 80-89. Hypertension stage 1  Systolic pressure: 140-159.  Diastolic pressure: 90-99. Hypertension stage 2  Systolic pressure: 160 or above.  Diastolic pressure: 100 or above. What health risks are associated with hypertension? Managing your hypertension is an important responsibility. Uncontrolled hypertension can lead to:  A heart attack.  A stroke.  A weakened blood vessel (aneurysm).  Heart failure.  Kidney damage.  Eye damage.  Metabolic syndrome.  Memory and concentration problems. What changes can I make to manage my hypertension? Hypertension can be managed effectively by making lifestyle changes and possibly by taking medicines. Your health care provider will help you come up with a plan to bring your blood pressure within a normal range. Your plan should include the following: Monitoring  Monitor your blood pressure at home as told by your health care provider. Your personal target blood pressure may vary depending on your medical conditions, your age, and other factors.  Have your blood pressure rechecked as told by your health care provider. Lifestyle  Lose weight if necessary.  Get at least 30-45 minutes of aerobic exercise at least 4 times a week.  Do not use any products that contain nicotine or tobacco, such as cigarettes and   e-cigarettes. If you need help quitting, ask your health care provider.  Learn ways to reduce stress.  Control any chronic conditions, such as high cholesterol or diabetes. Eating and drinking  Follow the DASH diet. This diet  is high in fruits, vegetables, and whole grains. It is low in salt, red meat, and added sugars.  Keep your sodium intake below 2,300 mg per day.  Limit alcoholic beverages. Communication  Review all the medicines you take with your health care provider because there may be side effects or interactions.  Talk with your health care provider about your diet, exercise habits, and other lifestyle factors that may be contributing to hypertension.  See your health care provider regularly. Your health care provider can help you create and adjust your plan for managing hypertension. Will I need medicine to control my blood pressure? Your health care provider may prescribe medicine if lifestyle changes are not enough to get your blood pressure under control, and if one of the following is true:  You are 18-59 years of age, and your systolic blood pressure is 140 or higher.  You are 60 years of age or older, and your systolic blood pressure is 150 or higher.  Your diastolic blood pressure is 90 or higher.  You have diabetes, and your systolic blood pressure is over 140 or your diastolic blood pressure is over 90.  You have kidney disease, and your blood pressure is above 140/90.  You have heart disease or a history of stroke, and your blood pressure is 140/90 or higher. Take medicines only as told by your health care provider. Follow the directions carefully. Blood pressure medicines must be taken as prescribed. The medicine does not work as well when you skip doses. Skipping doses also puts you at risk for problems. Contact a health care provider if:  You think you are having a reaction to medicines you have taken.  You have repeated (recurrent) headaches.  You feel dizzy.  You have swelling in your ankles.  You have trouble with your vision. Get help right away if:  You develop a severe headache or confusion.  You have unusual weakness or numbness, or you feel faint.  You have  severe pain in your chest or abdomen.  You vomit repeatedly.  You have trouble breathing. This information is not intended to replace advice given to you by your health care provider. Make sure you discuss any questions you have with your health care provider. Document Released: 08/01/2012 Document Revised: 07/12/2016 Document Reviewed: 02/05/2016 Elsevier Interactive Patient Education  2017 Elsevier Inc.  

## 2017-01-14 LAB — COMPREHENSIVE METABOLIC PANEL
ALK PHOS: 58 IU/L (ref 39–117)
ALT: 19 IU/L (ref 0–32)
AST: 16 IU/L (ref 0–40)
Albumin/Globulin Ratio: 2 (ref 1.2–2.2)
Albumin: 4.1 g/dL (ref 3.6–4.8)
BUN/Creatinine Ratio: 13 (ref 12–28)
BUN: 9 mg/dL (ref 8–27)
Bilirubin Total: 0.3 mg/dL (ref 0.0–1.2)
CO2: 24 mmol/L (ref 18–29)
CREATININE: 0.72 mg/dL (ref 0.57–1.00)
Calcium: 9.2 mg/dL (ref 8.7–10.3)
Chloride: 104 mmol/L (ref 96–106)
GFR calc Af Amer: 104 mL/min/{1.73_m2} (ref >59)
GFR calc non Af Amer: 90 mL/min/{1.73_m2} (ref >59)
GLOBULIN, TOTAL: 2.1 g/dL (ref 1.5–4.5)
Glucose: 95 mg/dL (ref 65–99)
POTASSIUM: 4.5 mmol/L (ref 3.5–5.2)
SODIUM: 145 mmol/L — AB (ref 134–144)
Total Protein: 6.2 g/dL (ref 6.0–8.5)

## 2017-01-14 LAB — LIPID PANEL
CHOL/HDL RATIO: 2.7 ratio (ref 0.0–4.4)
CHOLESTEROL TOTAL: 172 mg/dL (ref 100–199)
HDL: 64 mg/dL (ref >39)
LDL CALC: 100 mg/dL — AB (ref 0–99)
Triglycerides: 40 mg/dL (ref 0–149)
VLDL CHOLESTEROL CAL: 8 mg/dL (ref 5–40)

## 2017-01-14 LAB — MICROALBUMIN, URINE

## 2017-01-14 LAB — HCV INTERPRETATION

## 2017-01-14 LAB — HCV AB W/RFLX TO VERIFICATION

## 2017-01-18 ENCOUNTER — Ambulatory Visit (INDEPENDENT_AMBULATORY_CARE_PROVIDER_SITE_OTHER): Payer: BLUE CROSS/BLUE SHIELD | Admitting: Ophthalmology

## 2017-01-19 ENCOUNTER — Encounter: Payer: Self-pay | Admitting: Gastroenterology

## 2017-05-01 ENCOUNTER — Other Ambulatory Visit: Payer: Self-pay | Admitting: Family Medicine

## 2017-05-01 DIAGNOSIS — Z1231 Encounter for screening mammogram for malignant neoplasm of breast: Secondary | ICD-10-CM

## 2017-05-25 ENCOUNTER — Ambulatory Visit (INDEPENDENT_AMBULATORY_CARE_PROVIDER_SITE_OTHER): Payer: BLUE CROSS/BLUE SHIELD | Admitting: Family Medicine

## 2017-05-25 VITALS — BP 117/72 | HR 71 | Temp 98.4°F | Resp 16 | Ht 64.25 in | Wt 159.8 lb

## 2017-05-25 DIAGNOSIS — F17200 Nicotine dependence, unspecified, uncomplicated: Secondary | ICD-10-CM | POA: Diagnosis not present

## 2017-05-25 DIAGNOSIS — K1120 Sialoadenitis, unspecified: Secondary | ICD-10-CM | POA: Diagnosis not present

## 2017-05-25 NOTE — Patient Instructions (Addendum)
IF you received an x-ray today, you will receive an invoice from [facility]. Please contact [facility] at [phone] with questions or concerns regarding your invoice.   IF you received labwork today, you will receive an invoice from [facility]. Please contact [facility] at [phone] with questions or concerns regarding your invoice.   Our billing staff will not be able to assist you with questions regarding bills from these companies.  You will be contacted with the lab results as soon as they are available. The fastest way to get your results is to activate your My Chart account. Instructions are located on the last page of this paperwork. If you have not heard from [place] regarding the results in 2 weeks, please contact this office.    Salivary Gland Infection A salivary gland infection is an infection in one or more of the glands that produce spit (saliva). You have six major salivary glands. Each gland has a duct that carries saliva into your mouth. Saliva keeps your mouth moist and breaks down the food that you eat. It also helps to prevent tooth decay. Two salivary glands are located just in front of your ears (parotid). The ducts for these glands open up inside your cheeks, near your back teeth. You also have two glands under your tongue (sublingual) and two glands under your jaw (submandibular). The ducts for these glands open under your tongue. Any salivary gland can become infected. Most infections occur in the parotid glands or submandibular glands. What are the causes? Salivary glands can be infected by viruses or bacteria.  The mumps virus is the most common cause of viral salivary gland infections, though mumps is now rare in many areas because of vaccination. ? This infection causes swelling in both parotid glands. ? Viral infections are more common in children.  The bacteria that cause salivary gland infections are usually the same bacteria that normally  live in your mouth. ? A stone can form in a salivary gland and block the flow of saliva. As a result, saliva backs up into the salivary gland. Bacteria may then start to grow behind the blockage and cause infection. ? Bacterial infections usually cause pain and swelling on one side of the face. Submandibular gland swelling occurs under the jaw. Parotid swelling occurs in front of the ear. ? Bacterial infections are more common in adults.  What increases the risk? Children who do not get the MMR (measles, mumps, rubella) vaccine are more likely to get mumps, which can cause a viral salivary gland infection. Risk factors for bacterial infections include:  Poor dental care (oral hygiene).  Smoking.  Not drinking enough water.  Having a disease that causes dry mouth and dry eyes (Mikulicz syndrome or Sjogren syndrome).  What are the signs or symptoms? The main sign of salivary gland infection is a swollen salivary gland. This type of inflammation is often called sialadenitis. You may have swelling in front of your ear, under your jaw, or under your tongue. Swelling may get worse when you eat and decrease after you eat. Other signs and symptoms include:  Pain.  Tenderness.  Redness.  Dry mouth.  Bad taste in your mouth.  Difficulty chewing and swallowing.  Fever.  How is this diagnosed? Your health care provider may suspect a salivary gland infection based on your signs and symptoms. He or she will also do a physical exam. The health care provider will look and feel inside your mouth to see whether a  stone is blocking a salivary gland duct. You may need to see an ear, nose, and throat specialist (ENT or otolaryngologist) for diagnosis and treatment. You may also need to have diagnostic tests, such as:  An X-ray to check for a stone.  Other imaging studies to look for an abscess and to rule out other causes of swelling. These tests may include: ? Ultrasound. ? CT  scan. ? MRI.  Culture and sensitivity test. This involves collecting a sample of pus for testing in the lab to see what bacteria grow and what antibiotics they are sensitive to. The testing sample may be: ? Swabbed from a salivary gland duct. ? Withdrawn from a swollen gland with a needle (aspiration).  How is this treated? Viral salivary gland infections usually clear up without treatment. Bacterial infections are usually treated with antibiotic medicine. Severe infections that cause difficulty with swallowing may be treated with an IV antibiotic in the hospital. Other treatments may include:  Probing and widening the salivary duct to allow a stone to pass. In some cases, a thin, flexible scope (endoscope) may be inserted into the duct to find a stone and remove it.  Breaking up a stone using sound waves.  Draining an infected gland (abscess) with a needle.  In some cases, you may need surgery so your health care provider can: ? Remove a stone. ? Drain pus from an abscess. ? Remove a badly infected gland.  Follow these instructions at home:  Take medicines only as directed by your health care provider.  If you were prescribed an antibiotic medicine, finish it all even if you start to feel better.  Follow these instructions every few hours: ? Suck on a lemon candy to stimulate the flow of saliva. ? Put a warm compress over the gland. ? Gently massage the gland.  Drink enough fluid to keep your urine clear or pale yellow.  Rinse your mouth with a mixture of warm water and salt every few hours. To make this mixture, add a pinch of salt to 1 cup of warm water.  Practice good oral hygiene by brushing and flossing your teeth after meals and before you go to bed.  Do not use any tobacco products, including cigarettes, chewing tobacco, or electronic cigarettes. If you need help quitting, ask your health care provider. Contact a health care provider if:  You have pain and swelling  in your face, jaw, or mouth after eating.  You have persistent swelling in any of these places: ? In front of your ear. ? Under your jaw. ? Inside your mouth. Get help right away if:  You have pain and swelling in your face, jaw, or mouth that are getting worse.  Your pain and swelling make it hard to swallow or breathe. This information is not intended to replace advice given to you by your health care provider. Make sure you discuss any questions you have with your health care provider. Document Released: 12/15/2004 Document Revised: 04/14/2016 Document Reviewed: 04/09/2014 Elsevier Interactive Patient Education  2018 [employer].

## 2017-05-25 NOTE — Progress Notes (Signed)
Chief Complaint  Patient presents with  . Mass    under right side(chin) of throat, onset: tuesday night    HPI  Pt reports that she has been having pain and swelling in her lower jaw on the right side for 2 days She reports that she is swallowing well She has no wheezing or shortness of breath She has no fevers or chills She had MMR as a child and denies recent infection or exposure to mumps She denies pain with turning her head and there is no neck pain She is a smoker   Past Medical History:  Diagnosis Date  . Allergy    seasonal  . Atypical chest pain 01/2007   negative stress test  . Colon polyps   . Depression   . Fibroids   . Hyperlipidemia   . Hypertension   . Panic attacks     Current Outpatient Prescriptions  Medication Sig Dispense Refill  . amLODipine (NORVASC) 5 MG tablet Take 1 tablet (5 mg total) by mouth daily. 90 tablet 3  . aspirin 81 MG tablet Take 81 mg by mouth daily.      . hydrOXYzine (ATARAX/VISTARIL) 10 MG tablet Take 10 mg by mouth as needed.    Marland Kitchen levocetirizine (XYZAL) 5 MG tablet Take 5 mg by mouth daily.    . mometasone (ELOCON) 0.1 % cream Apply 1 application topically as needed.    . montelukast (SINGULAIR) 10 MG tablet Take 1 tablet (10 mg total) by mouth at bedtime. 90 tablet 3  . simvastatin (ZOCOR) 20 MG tablet Take 1 tablet (20 mg total) by mouth at bedtime. 90 tablet 1  . cetirizine (ZYRTEC) 10 MG tablet Take 1 tablet (10 mg total) by mouth daily. (Patient not taking: Reported on 05/25/2017) 30 tablet 12  . fluticasone (FLONASE) 50 MCG/ACT nasal spray Place 2 sprays into both nostrils daily. (Patient not taking: Reported on 05/25/2017) 16 g 6  . mometasone (NASONEX) 50 MCG/ACT nasal spray Place 2 sprays into the nose daily. (Patient not taking: Reported on 05/25/2017) 17 g 12   No current facility-administered medications for this visit.     Allergies:  Allergies  Allergen Reactions  . Iodinated Diagnostic Agents Shortness Of Breath  .  Lisinopril Hives  . Shellfish Allergy     Past Surgical History:  Procedure Laterality Date  . ABDOMINAL HYSTERECTOMY  1998  . CATARACT EXTRACTION    . NASAL SINUS SURGERY  2007   Dr Mikey Bussing out area to help breath    Social History   Social History  . Marital status: Single    Spouse name: N/A  . Number of children: 2  . Years of education: N/A   Occupational History  . Litigation Assessor [employer]   Social History Main Topics  . Smoking status: Current Every Day Smoker    Packs/day: 0.30    Years: 34.00    Types: Cigarettes  . Smokeless tobacco: Never Used     Comment: 7 cigs daily 08/20/13  . Alcohol use 0.6 oz/week    1 Glasses of wine per week     Comment: white wine  . Drug use: No  . Sexual activity: Yes    Birth control/ protection: Surgical   Other Topics Concern  . 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   Divorced   2 children    ROS  See hpi  Objective: Vitals:   05/25/17  1441  BP: 117/72  Pulse: 71  Resp: 16  Temp: 98.4 F (36.9 C)  TempSrc: Oral  SpO2: 96%  Weight: 159 lb 12.8 oz (72.5 kg)  Height: 5' 4.25" (1.632 m)    Physical Exam General: alert, oriented, in NAD Head: normocephalic, atraumatic, lower jaw with enlargement and with palpable swelling in the buccal region  nontender Eyes: EOM intact, no scleral icterus or conjunctival injection Ears: TM clear bilaterally Nose: mucosa nonerythematous, nonedematous Throat: no pharyngeal exudate or erythema Lymph: no posterior auricular, submental or cervical lymph adenopathy Heart: normal rate, normal sinus rhythm, no murmurs Lungs: clear to auscultation bilaterally, no wheezing   Assessment and Plan Diamonds was seen today for mass.  Diagnoses and all orders for this visit:  Sialadenitis- advised sour candy Discussed that if there is no improvement with sour candy and gargle then she should call for  referral to ENT   Tobacco use disorder- advised smoking cessation Discussed that smoking is a risk factor for sialadenitis Discussed options for quitting Pt in precontemplation     [place]

## 2017-05-26 ENCOUNTER — Ambulatory Visit
Admission: RE | Admit: 2017-05-26 | Discharge: 2017-05-26 | Disposition: A | Discharge status: Home / Self Care | Payer: BLUE CROSS/BLUE SHIELD | Source: Ambulatory Visit | Attending: Family Medicine | Admitting: Family Medicine

## 2017-05-26 DIAGNOSIS — Z1231 Encounter for screening mammogram for malignant neoplasm of breast: Secondary | ICD-10-CM

## 2017-05-30 ENCOUNTER — Other Ambulatory Visit: Payer: Self-pay | Admitting: Family Medicine

## 2017-05-30 DIAGNOSIS — R928 Other abnormal and inconclusive findings on diagnostic imaging of breast: Secondary | ICD-10-CM

## 2017-06-05 ENCOUNTER — Ambulatory Visit
Admission: RE | Admit: 2017-06-05 | Discharge: 2017-06-05 | Disposition: A | Discharge status: Home / Self Care | Payer: BLUE CROSS/BLUE SHIELD | Source: Ambulatory Visit | Attending: Family Medicine | Admitting: Family Medicine

## 2017-06-05 ENCOUNTER — Ambulatory Visit: Payer: BLUE CROSS/BLUE SHIELD

## 2017-06-05 DIAGNOSIS — R928 Other abnormal and inconclusive findings on diagnostic imaging of breast: Secondary | ICD-10-CM

## 2017-06-23 ENCOUNTER — Other Ambulatory Visit: Payer: Self-pay | Admitting: Family Medicine

## 2017-07-17 ENCOUNTER — Other Ambulatory Visit: Payer: Self-pay | Admitting: Family Medicine

## 2017-10-03 ENCOUNTER — Other Ambulatory Visit: Payer: Self-pay | Admitting: Family Medicine

## 2017-10-03 NOTE — Telephone Encounter (Signed)
No  Liver  Or  Lipid  Panel   Done   Within  6 months

## 2017-11-06 ENCOUNTER — Ambulatory Visit: Payer: BLUE CROSS/BLUE SHIELD | Admitting: Family Medicine

## 2017-11-06 NOTE — Progress Notes (Deleted)
No chief complaint on file.   HPI  4 review of systems  Past Medical History:  Diagnosis Date  . Allergy    seasonal  . Atypical chest pain 01/2007   negative stress test  . Colon polyps   . Depression   . Fibroids   . Hyperlipidemia   . Hypertension   . Panic attacks     Current Outpatient Medications  Medication Sig Dispense Refill  . amLODipine (NORVASC) 5 MG tablet Take 1 tablet (5 mg total) by mouth daily. 90 tablet 3  . aspirin 81 MG tablet Take 81 mg by mouth daily.      . cetirizine (ZYRTEC) 10 MG tablet Take 1 tablet (10 mg total) by mouth daily. (Patient not taking: Reported on 05/25/2017) 30 tablet 12  . fluticasone (FLONASE) 50 MCG/ACT nasal spray Place 2 sprays into both nostrils daily. (Patient not taking: Reported on 05/25/2017) 16 g 6  . hydrOXYzine (ATARAX/VISTARIL) 10 MG tablet Take 10 mg by mouth as needed.    Marland Kitchen levocetirizine (XYZAL) 5 MG tablet Take 5 mg by mouth daily.    . mometasone (ELOCON) 0.1 % cream Apply 1 application topically as needed.    . mometasone (NASONEX) 50 MCG/ACT nasal spray Place 2 sprays into the nose daily. (Patient not taking: Reported on 05/25/2017) 17 g 12  . montelukast (SINGULAIR) 10 MG tablet Take 1 tablet (10 mg total) by mouth at bedtime. 90 tablet 3  . simvastatin (ZOCOR) 20 MG tablet Take 1 tablet (20 mg total) by mouth at bedtime. Office visit needed for refills 15 tablet 0   No current facility-administered medications for this visit.     Allergies:  Allergies  Allergen Reactions  . Iodinated Diagnostic Agents Shortness Of Breath  . Lisinopril Hives  . Shellfish Allergy     Past Surgical History:  Procedure Laterality Date  . ABDOMINAL HYSTERECTOMY  1998  . CATARACT EXTRACTION    . NASAL SINUS SURGERY  2007   Dr Mikey Bussing out area to help breath    Social History   Socioeconomic History  . Marital status: Single    Spouse name: Not on file  . Number of children: 2  . Years of education: Not on file  .  Highest education level: Not on file  Social Needs  . Financial resource strain: Not on file  . Food insecurity - worry: Not on file  . Food insecurity - inability: Not on file  . Transportation needs - medical: Not on file  . Transportation needs - non-medical: Not on file  Occupational History  . Occupation: Hydrologist: [employer]  Tobacco Use  . Smoking status: Current Every Day Smoker    Packs/day: 0.30    Years: 34.00    Pack years: 10.20    Types: Cigarettes  . Smokeless tobacco: Never Used  . Tobacco comment: 7 cigs daily 08/20/13  Substance and Sexual Activity  . Alcohol use: Yes    Alcohol/week: 0.6 oz    Types: 1 Glasses of wine per week    Comment: white wine  . Drug use: No  . Sexual activity: Yes    Birth control/protection: Surgical  Other Topics Concern  . 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 Arthritis Mother   . Hypertension Father   .  Heart disease Maternal Grandmother   . Heart disease Paternal Grandmother      ROS Review of Systems See HPI Constitution: No fevers or chills No malaise No diaphoresis Skin: No rash or itching Eyes: no blurry vision, no double vision GU: no dysuria or hematuria Neuro: no dizziness or headaches * all others reviewed and negative   Objective: There were no vitals filed for this visit.  Physical Exam  Assessment and Plan There are no diagnoses linked to this encounter.   Tiffany Gonzales

## 2017-11-24 ENCOUNTER — Telehealth: Payer: Self-pay | Admitting: Family Medicine

## 2017-11-24 ENCOUNTER — Other Ambulatory Visit: Payer: Self-pay

## 2017-11-24 ENCOUNTER — Ambulatory Visit (INDEPENDENT_AMBULATORY_CARE_PROVIDER_SITE_OTHER): Payer: BLUE CROSS/BLUE SHIELD | Admitting: Family Medicine

## 2017-11-24 ENCOUNTER — Encounter: Payer: Self-pay | Admitting: Family Medicine

## 2017-11-24 VITALS — BP 110/64 | HR 80 | Temp 99.1°F | Resp 17 | Ht 64.25 in | Wt 155.2 lb

## 2017-11-24 DIAGNOSIS — Z716 Tobacco abuse counseling: Secondary | ICD-10-CM

## 2017-11-24 DIAGNOSIS — Z760 Encounter for issue of repeat prescription: Secondary | ICD-10-CM

## 2017-11-24 DIAGNOSIS — I10 Essential (primary) hypertension: Secondary | ICD-10-CM | POA: Diagnosis not present

## 2017-11-24 DIAGNOSIS — J302 Other seasonal allergic rhinitis: Secondary | ICD-10-CM

## 2017-11-24 DIAGNOSIS — F17200 Nicotine dependence, unspecified, uncomplicated: Secondary | ICD-10-CM | POA: Diagnosis not present

## 2017-11-24 MED ORDER — NICOTINE POLACRILEX 4 MG MT LOZG: 4.0000 mg | LOZENGE | OROMUCOSAL | 0 refills | Status: DC | PRN

## 2017-11-24 MED ORDER — MONTELUKAST SODIUM 10 MG PO TABS: 10.0000 mg | ORAL_TABLET | Freq: Every day | ORAL | 3 refills | Status: DC

## 2017-11-24 MED ORDER — SIMVASTATIN 20 MG PO TABS: 20.0000 mg | ORAL_TABLET | Freq: Every day | ORAL | 1 refills | Status: DC

## 2017-11-24 NOTE — Telephone Encounter (Signed)
error 

## 2017-11-24 NOTE — Patient Instructions (Addendum)
   IF you received an x-ray today, you will receive an invoice from [facility]. Please contact [facility] at [phone] with questions or concerns regarding your invoice.   IF you received labwork today, you will receive an invoice from [facility]. Please contact [facility] at [phone] with questions or concerns regarding your invoice.   Our billing staff will not be able to assist you with questions regarding bills from these companies.  You will be contacted with the lab results as soon as they are available. The fastest way to get your results is to activate your My Chart account. Instructions are located on the last page of this paperwork. If you have not heard from us regarding the results in 2 weeks, please contact this office.     Coping with Quitting Smoking Quitting smoking is a physical and mental challenge. You will face cravings, withdrawal symptoms, and temptation. Before quitting, work with your health care provider to make a plan that can help you cope. Preparation can help you quit and keep you from giving in. How can I cope with cravings? Cravings usually last for 5-10 minutes. If you get through it, the craving will pass. Consider taking the following actions to help you cope with cravings:  Keep your mouth busy: ? Chew sugar-free gum. ? Suck on hard candies or a straw. ? Brush your teeth.  Keep your hands and body busy: ? Immediately change to a different activity when you feel a craving. ? Squeeze or play with a ball. ? Do an activity or a hobby, like making bead jewelry, practicing needlepoint, or working with wood. ? Mix up your normal routine. ? Take a short exercise break. Go for a quick walk or run up and down stairs. ? Spend time in public places where smoking is not allowed.  Focus on doing something kind or helpful for someone else.  Call a friend or family member to talk during a craving.  Join a support group.  Call a quit  line, such as 1-800-QUIT-NOW.  Talk with your health care provider about medicines that might help you cope with cravings and make quitting easier for you.  How can I deal with withdrawal symptoms? Your body may experience negative effects as it tries to get used to not having nicotine in the system. These effects are called withdrawal symptoms. They may include:  Feeling hungrier than normal.  Trouble concentrating.  Irritability.  Trouble sleeping.  Feeling depressed.  Restlessness and agitation.  Craving a cigarette.  To manage withdrawal symptoms:  Avoid places, people, and activities that trigger your cravings.  Remember why you want to quit.  Get plenty of sleep.  Avoid coffee and other caffeinated drinks. These may worsen some of your symptoms.  How can I handle social situations? Social situations can be difficult when you are quitting smoking, especially in the first few weeks. To manage this, you can:  Avoid parties, bars, and other social situations where people might be smoking.  Avoid alcohol.  Leave right away if you have the urge to smoke.  Explain to your family and friends that you are quitting smoking. Ask for understanding and support.  Plan activities with friends or family where smoking is not an option.  What are some ways I can cope with stress? Wanting to smoke may cause stress, and stress can make you want to smoke. Find ways to manage your stress. Relaxation techniques can help. For example:  Breathe slowly and deeply, in   through your nose and out through your mouth.  Listen to soothing, relaxing music.  Talk with a family member or friend about your stress.  Light a candle.  Soak in a bath or take a shower.  Think about a peaceful place.  What are some ways I can prevent weight gain? Be aware that many people gain weight after they quit smoking. However, not everyone does. To keep from gaining weight, have a plan in place before  you quit and stick to the plan after you quit. Your plan should include:  Having healthy snacks. When you have a craving, it may help to: ? Eat plain popcorn, crunchy carrots, celery, or other cut vegetables. ? Chew sugar-free gum.  Changing how you eat: ? Eat small portion sizes at meals. ? Eat 4-6 small meals throughout the day instead of 1-2 large meals a day. ? Be mindful when you eat. Do not watch television or do other things that might distract you as you eat.  Exercising regularly: ? Make time to exercise each day. If you do not have time for a long workout, do short bouts of exercise for 5-10 minutes several times a day. ? Do some form of strengthening exercise, like weight lifting, and some form of aerobic exercise, like running or swimming.  Drinking plenty of water or other low-calorie or no-calorie drinks. Drink 6-8 glasses of water daily, or as much as instructed by your health care provider.  Summary  Quitting smoking is a physical and mental challenge. You will face cravings, withdrawal symptoms, and temptation to smoke again. Preparation can help you as you go through these challenges.  You can cope with cravings by keeping your mouth busy (such as by chewing gum), keeping your body and hands busy, and making calls to family, friends, or a helpline for people who want to quit smoking.  You can cope with withdrawal symptoms by avoiding places where people smoke, avoiding drinks with caffeine, and getting plenty of rest.  Ask your health care provider about the different ways to prevent weight gain, avoid stress, and handle social situations. This information is not intended to replace advice given to you by your health care provider. Make sure you discuss any questions you have with your health care provider. Document Released: 11/04/2016 Document Revised: 11/04/2016 Document Reviewed: 11/04/2016 Elsevier Interactive Patient Education  2018 Elsevier Inc.  

## 2017-11-24 NOTE — Progress Notes (Signed)
Chief Complaint  Patient presents with  . Medication Refill    amlodopine, hydroxyzine, montelukast and simvastatin    HPI   Hypertension: Patient here for follow-up of elevated blood pressure. She is not exercising and is adherent to low salt diet.  Blood pressure is well controlled at home. Cardiac symptoms none. Patient denies chest pain, chest pressure/discomfort, claudication, dyspnea, exertional chest pressure/discomfort, fatigue, irregular heart beat and lower extremity edema.  Cardiovascular risk factors: hypertension and smoking/ tobacco exposure. Use of agents associated with hypertension: none. History of target organ damage: none.   BP Readings from Last 3 Encounters:  11/24/17 110/64  05/25/17 117/72  01/13/17 128/82   Wt Readings from Last 3 Encounters:  11/24/17 155 lb 3.2 oz (70.4 kg)  05/25/17 159 lb 12.8 oz (72.5 kg)  01/13/17 157 lb (71.2 kg)    Tobacco use She quit six weeks ago by going to class at [facility] She then went back to smoking when her mother died She resumed the patch and is now only smoking 4 cigs a day She still smokes one cigarette every morning due to her cravings   Med refills She would like refill of her statin and her singulair  Past Medical History:  Diagnosis Date  . Allergy    seasonal  . Atypical chest pain 01/2007   negative stress test  . Colon polyps   . Depression   . Fibroids   . Hyperlipidemia   . Hypertension   . Panic attacks     Current Outpatient Medications  Medication Sig Dispense Refill  . aspirin 81 MG tablet Take 81 mg by mouth daily.      . cetirizine (ZYRTEC) 10 MG tablet Take 1 tablet (10 mg total) by mouth daily. 30 tablet 12  . fluticasone (FLONASE) 50 MCG/ACT nasal spray Place 2 sprays into both nostrils daily. 16 g 6  . hydrOXYzine (ATARAX/VISTARIL) 10 MG tablet Take 10 mg by mouth as needed.    Marland Kitchen levocetirizine (XYZAL) 5 MG tablet Take 5 mg by mouth daily.    . mometasone (ELOCON) 0.1 % cream  Apply 1 application topically as needed.    . mometasone (NASONEX) 50 MCG/ACT nasal spray Place 2 sprays into the nose daily. 17 g 12  . montelukast (SINGULAIR) 10 MG tablet Take 1 tablet (10 mg total) by mouth at bedtime. 90 tablet 3  . simvastatin (ZOCOR) 20 MG tablet Take 1 tablet (20 mg total) by mouth at bedtime. Office visit needed for refills 90 tablet 1  . nicotine polacrilex (NICOTINE MINI) 4 MG lozenge Take 1 lozenge (4 mg total) by mouth as needed for smoking cessation. 100 tablet 0   No current facility-administered medications for this visit.     Allergies:  Allergies  Allergen Reactions  . Iodinated Diagnostic Agents Shortness Of Breath  . Lisinopril Hives  . Shellfish Allergy     Past Surgical History:  Procedure Laterality Date  . ABDOMINAL HYSTERECTOMY  1998  . CATARACT EXTRACTION    . NASAL SINUS SURGERY  2007   Dr Mikey Bussing out area to help breath    Social History   Socioeconomic History  . Marital status: Single    Spouse name: None  . Number of children: 2  . Years of education: None  . Highest education level: None  Social Needs  . Financial resource strain: None  . Food insecurity - worry: None  . Food insecurity - inability: None  . Transportation needs - medical: None  .  Transportation needs - non-medical: None  Occupational History  . Occupation: Hydrologist: [employer]  Tobacco Use  . Smoking status: Current Every Day Smoker    Packs/day: 0.30    Years: 34.00    Pack years: 10.20    Types: Cigarettes  . Smokeless tobacco: Never Used  . Tobacco comment: 7 cigs daily 08/20/13  Substance and Sexual Activity  . Alcohol use: Yes    Alcohol/week: 0.6 oz    Types: 1 Glasses of wine per week    Comment: white wine  . Drug use: No  . Sexual activity: Yes    Birth control/protection: Surgical  Other Topics Concern  . 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 Arthritis Mother   . Hypertension Father   . Heart disease Maternal Grandmother   . Heart disease Paternal Grandmother      ROS Review of Systems See HPI Constitution: No fevers or chills No malaise No diaphoresis Skin: No rash or itching Eyes: no blurry vision, no double vision GU: no dysuria or hematuria Neuro: no dizziness or headaches  all others reviewed and negative   Objective: Vitals:   11/24/17 1523  BP: 110/64  Pulse: 80  Resp: 17  Temp: 99.1 F (37.3 C)  TempSrc: Oral  SpO2: 97%  Weight: 155 lb 3.2 oz (70.4 kg)  Height: 5' 4.25" (1.632 m)    Physical Exam  Constitutional: She is oriented to person, place, and time. She appears well-developed and well-nourished.  HENT:  Head: Normocephalic and atraumatic.  Eyes: Conjunctivae and EOM are normal.  Cardiovascular: Normal rate, regular rhythm and normal heart sounds.  No murmur heard. Pulmonary/Chest: Effort normal and breath sounds normal. No stridor. No respiratory distress.  Neurological: She is alert and oriented to person, place, and time.  Skin: Skin is warm. Capillary refill takes less than 2 seconds.  Psychiatric: She has a normal mood and affect. Her behavior is normal. Judgment and thought content normal.    Assessment and Plan Viha was seen today for medication refill.  Diagnoses and all orders for this visit:  Medication refill- refill meds but stopped amlodipine  Other seasonal allergic rhinitis- refilled -     montelukast (SINGULAIR) 10 MG tablet; Take 1 tablet (10 mg total) by mouth at bedtime.  Essential hypertension- bp well controlled  Advised to do a holiday from amlodipine Discussed that she should return for bp check   Tobacco use disorder- continue nicotine patch  Discussed lozenge for first thing in the morning smoking cravings  Encounter for smoking cessation counseling  Other orders        -     nicotine polacrilex (NICOTINE MINI) 4 MG lozenge; Take 1 lozenge (4 mg total) by mouth as needed for smoking cessation. -     simvastatin (ZOCOR) 20 MG tablet; Take 1 tablet (20 mg total) by mouth at bedtime. Office visit needed for refills   A total of 25 minutes were spent face-to-face with the patient during this encounter and over half of that time was spent on counseling and coordination of care.   [place]

## 2017-12-15 ENCOUNTER — Other Ambulatory Visit: Payer: Self-pay | Admitting: Family Medicine

## 2017-12-15 DIAGNOSIS — J302 Other seasonal allergic rhinitis: Secondary | ICD-10-CM

## 2018-01-17 ENCOUNTER — Encounter: Payer: BLUE CROSS/BLUE SHIELD | Admitting: Family Medicine

## 2018-01-31 ENCOUNTER — Other Ambulatory Visit: Payer: Self-pay

## 2018-01-31 ENCOUNTER — Encounter: Payer: Self-pay | Admitting: Family Medicine

## 2018-01-31 ENCOUNTER — Ambulatory Visit (INDEPENDENT_AMBULATORY_CARE_PROVIDER_SITE_OTHER): Payer: BLUE CROSS/BLUE SHIELD | Admitting: Family Medicine

## 2018-01-31 VITALS — BP 100/64 | HR 90 | Temp 98.6°F | Resp 18 | Ht 64.0 in | Wt 149.8 lb

## 2018-01-31 DIAGNOSIS — Z0000 Encounter for general adult medical examination without abnormal findings: Secondary | ICD-10-CM | POA: Diagnosis not present

## 2018-01-31 DIAGNOSIS — Z1321 Encounter for screening for nutritional disorder: Secondary | ICD-10-CM | POA: Diagnosis not present

## 2018-01-31 DIAGNOSIS — F17200 Nicotine dependence, unspecified, uncomplicated: Secondary | ICD-10-CM

## 2018-01-31 DIAGNOSIS — I10 Essential (primary) hypertension: Secondary | ICD-10-CM | POA: Diagnosis not present

## 2018-01-31 MED ORDER — NICOTINE POLACRILEX 4 MG MT LOZG: 4.0000 mg | LOZENGE | OROMUCOSAL | 0 refills | Status: DC | PRN

## 2018-01-31 NOTE — Progress Notes (Signed)
Chief Complaint  Patient presents with  . Annual Exam    Subjective:  Tiffany Gonzales is a 62 y.o. female here for a health maintenance visit.  Patient is established pt   Patient Active Problem List   Diagnosis Date Noted  . Breast abscess 04/08/2015  . Memory loss 12/09/2013  . History of insect sting allergy 11/18/2012  . Post-menopause atrophic vaginitis 09/03/2012  . Allergic reaction 06/05/2012  . OSA (obstructive sleep apnea) 11/08/2011  . Cervical disc disease 06/28/2011  . TOBACCO ABUSE 03/01/2010  . LEUKOPENIA, MILD 12/10/2008  . HYPERCHOLESTEROLEMIA 12/09/2008  . Essential hypertension 03/05/2008  . DEPRESSIVE DISORDER 11/13/2007    Past Medical History:  Diagnosis Date  . Allergy    seasonal  . Atypical chest pain 01/2007   negative stress test  . Colon polyps   . Depression   . Fibroids   . Hyperlipidemia   . Hypertension   . Panic attacks     Past Surgical History:  Procedure Laterality Date  . ABDOMINAL HYSTERECTOMY  1998  . CATARACT EXTRACTION    . NASAL SINUS SURGERY  2007   Dr Mikey Bussing out area to help breath     Outpatient Medications Prior to Visit  Medication Sig Dispense Refill  . aspirin 81 MG tablet Take 81 mg by mouth daily.      . hydrOXYzine (ATARAX/VISTARIL) 10 MG tablet Take 10 mg by mouth as needed.    Marland Kitchen levocetirizine (XYZAL) 5 MG tablet Take 5 mg by mouth daily.    . mometasone (ELOCON) 0.1 % cream Apply 1 application topically as needed.    . montelukast (SINGULAIR) 10 MG tablet TAKE 1 TABLET BY MOUTH AT BEDTIME. GENERIC EQUIVALENT FOR SINGULAIR 90 tablet 0  . simvastatin (ZOCOR) 20 MG tablet Take 1 tablet (20 mg total) by mouth at bedtime. Office visit needed for refills 90 tablet 1  . amLODipine (NORVASC) 5 MG tablet TAKE 1 TABLET BY MOUTH DAILY. GENERIC EQUIVALENT FOR NORVASC 90 tablet 0  . nicotine polacrilex (NICOTINE MINI) 4 MG lozenge Take 1 lozenge (4 mg total) by mouth as needed for smoking cessation. 100  tablet 0  . cetirizine (ZYRTEC) 10 MG tablet Take 1 tablet (10 mg total) by mouth daily. (Patient not taking: Reported on 01/31/2018) 30 tablet 12  . fluticasone (FLONASE) 50 MCG/ACT nasal spray Place 2 sprays into both nostrils daily. (Patient not taking: Reported on 01/31/2018) 16 g 6  . mometasone (NASONEX) 50 MCG/ACT nasal spray Place 2 sprays into the nose daily. (Patient not taking: Reported on 01/31/2018) 17 g 12   No facility-administered medications prior to visit.     Allergies  Allergen Reactions  . Iodinated Diagnostic Agents Shortness Of Breath  . Lisinopril Hives  . Shellfish Allergy      Family History  Problem Relation Age of Onset  . Arthritis Mother   . Hypertension Father   . Heart disease Maternal Grandmother   . Heart disease Paternal Grandmother      Health Habits: Dental Exam: up to date Eye Exam: up to date Exercise:  times/week on average Current exercise activities: walking 1.5 miles in the neighborhood, also goes to the gym Diet: balanced Wt Readings from Last 3 Encounters:  01/31/18 149 lb 12.8 oz (67.9 kg)  11/24/17 155 lb 3.2 oz (70.4 kg)  05/25/17 159 lb 12.8 oz (72.5 kg)     Social History   Socioeconomic History  . Marital status: Single    Spouse name: Not  on file  . Number of children: 2  . Years of education: Not on file  . Highest education level: Not on file  Social Needs  . Financial resource strain: Not on file  . Food insecurity - worry: Not on file  . Food insecurity - inability: Not on file  . Transportation needs - medical: Not on file  . Transportation needs - non-medical: Not on file  Occupational History  . Occupation: Hydrologist: [employer]  Tobacco Use  . Smoking status: Current Every Day Smoker    Packs/day: 0.30    Years: 34.00    Pack years: 10.20    Types: Cigarettes  . Smokeless tobacco: Never Used  . Tobacco comment: 7 cigs daily 08/20/13  Substance and Sexual Activity  .  Alcohol use: Yes    Alcohol/week: 0.6 oz    Types: 1 Glasses of wine per week    Comment: white wine  . Drug use: No  . Sexual activity: Yes    Birth control/protection: Surgical  Other Topics Concern  . 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 Alcohol/week: 0.6 oz  . Types: 1 Glasses of wine per week   Comment: white wine   Social History   Tobacco Use  Smoking Status Current Every Day Smoker  . Packs/day: 0.30  . Years: 34.00  . Pack years: 10.20  . Types: Cigarettes  Smokeless Tobacco Never Used  Tobacco Comment   7 cigs daily 08/20/13   Social History   Substance and Sexual Activity  Drug Use No    GYN: Sexual Health Menstrual status: regular menses LMP: No LMP recorded. Patient has had a hysterectomy. Last pap smear: see HM section History of abnormal pap smears:  Sexually active:  with ** partner Current contraception:   Health Maintenance: See under health Maintenance activity for review of completion dates as well. Immunization History  Administered Date(s) Administered  . Tdap 08/19/2015      Depression Screen-PHQ2/9 Depression screen [facility] Ed 2/9 01/31/2018 11/24/2017 05/25/2017 01/13/2017 10/31/2016  Decreased Interest 0 0 0 0 0  Down, Depressed, Hopeless 0 0 0 0 0  PHQ - 2 Score 0 0 0 0 0       Depression Severity and Treatment Recommendations:  0-4= None  5-9= Mild / Treatment: Support, educate to call if worse; return in one month  10-14= Moderate / Treatment: Support, watchful waiting; Antidepressant or Psycotherapy  15-19= Moderately severe / Treatment: Antidepressant OR Psychotherapy  >= 20 = Major depression, severe / Antidepressant AND Psychotherapy    Review of Systems   Review of Systems  Constitutional: Negative for chills, fever and weight loss.  HENT: Negative for ear discharge,  ear pain, hearing loss and tinnitus.   Eyes: Negative for blurred vision and double vision.  Respiratory: Negative for cough, sputum production, shortness of breath and wheezing.   Cardiovascular: Negative for chest pain, palpitations, orthopnea and claudication.  Gastrointestinal: Negative for abdominal pain, diarrhea, nausea and vomiting.  Genitourinary: Negative for dysuria, frequency, hematuria and urgency.  Musculoskeletal: Negative for back pain, myalgias and neck pain.  Neurological: Negative for dizziness, tingling, tremors and headaches.  Endo/Heme/Allergies: Positive for environmental allergies.  Psychiatric/Behavioral: Negative for depression and hallucinations. The patient is not nervous/anxious.       Objective:  Vitals:   01/31/18 0827  BP: 100/64  Pulse: 90  Resp: 18  Temp: 98.6 F (37 C)  TempSrc: Oral  SpO2: 97%  Weight: 149 lb 12.8 oz (67.9 kg)  Height: 5\' 4"  (1.626 m)    Body mass index is 25.71 kg/m.  Physical Exam  Constitutional: She is oriented to person, place, and time. She appears well-developed and well-nourished.  HENT:  Head: Normocephalic and atraumatic.  Right Ear: External ear normal.  Left Ear: External ear normal.  Nose: Nose normal.  Mouth/Throat: Oropharynx is clear and moist. No oropharyngeal exudate.  Eyes: Conjunctivae and EOM are normal.  Neck: Normal range of motion. Neck supple. No thyromegaly present.  Cardiovascular: Normal rate, regular rhythm and normal heart sounds.  No murmur heard. Pulmonary/Chest: Effort normal and breath sounds normal. No respiratory distress. She has no wheezes.  Abdominal: Soft. Bowel sounds are normal. She exhibits no distension. There is no tenderness. There is no rebound and no guarding.  Musculoskeletal: Normal range of motion. She exhibits no edema.  Neurological: She is alert and oriented to person, place, and time. She has normal reflexes. No cranial nerve deficit.  Skin: Skin is warm. No rash  noted. No erythema. No pallor.  Psychiatric: She has a normal mood and affect. Her behavior is normal. Judgment and thought content normal.       Assessment/Plan:   Patient was seen for a health maintenance exam.  Counseled the patient on health maintenance issues. Reviewed her health mainteance schedule and ordered appropriate tests (see orders.) Counseled on regular exercise and weight management. Recommend regular eye exams and dental cleaning.   The following issues were addressed today for health maintenance:   Tiffany Gonzales was seen today for annual exam.  Diagnoses and all orders for this visit:  Health maintenance examination- age appropriate screenings reviewed  Essential hypertension- stop amlodipine and monitor -     Comprehensive metabolic panel -     Lipid panel  TOBACCO ABUSE - discussed smoking and reviewed strategies. Reissued nicotine lozenge  Encounter for vitamin deficiency screening -     VITAMIN D 25 Hydroxy (Vit-D Deficiency, Fractures)  Other orders -     Cancel: Flu Vaccine QUAD 36+ mos IM -     nicotine polacrilex (NICOTINE MINI) 4 MG lozenge; Take 1 lozenge (4 mg total) by mouth as needed for smoking cessation.    Return in about 1 year (around 02/01/2019).    Body mass index is 25.71 kg/m.:  Discussed the patient's BMI with patient. The BMI body mass index is 25.71 kg/m.     No future appointments.  Patient Instructions       IF you received an x-ray today, you will receive an invoice from [facility]. Please contact [facility] at [phone] with questions or concerns regarding your invoice.   IF you received labwork today, you will receive an invoice from [place] [facility]. Please contact [facility] at [phone] with questions or concerns regarding your invoice.   Our billing staff will not be able to assist you with questions regarding bills from these companies.  You will be contacted with the lab results as soon as they  are available. The fastest way to get your results is to activate your My Chart account. Instructions are located on the last page of this paperwork. If you have not heard from [place] regarding the results in 2 weeks, please contact this office.     Coping with Quitting Smoking Quitting smoking is a physical and  mental challenge. You will face cravings, withdrawal symptoms, and temptation. Before quitting, work with your health care provider to make a plan that can help you cope. Preparation can help you quit and keep you from giving in. How can I cope with cravings? Cravings usually last for 5-10 minutes. If you get through it, the craving will pass. Consider taking the following actions to help you cope with cravings:  Keep your mouth busy: ? Chew sugar-free gum. ? Suck on hard candies or a straw. ? Brush your teeth.  Keep your hands and body busy: ? Immediately change to a different activity when you feel a craving. ? Squeeze or play with a ball. ? Do an activity or a hobby, like making bead jewelry, practicing needlepoint, or working with wood. ? Mix up your normal routine. ? Take a short exercise break. Go for a quick walk or run up and down stairs. ? Spend time in public places where smoking is not allowed.  Focus on doing something kind or helpful for someone else.  Call a friend or family member to talk during a craving.  Join a support group.  Call a quit line, such as 1-800-QUIT-NOW.  Talk with your health care provider about medicines that might help you cope with cravings and make quitting easier for you.  How can I deal with withdrawal symptoms? Your body may experience negative effects as it tries to get used to not having nicotine in the system. These effects are called withdrawal symptoms. They may include:  Feeling hungrier than normal.  Trouble concentrating.  Irritability.  Trouble sleeping.  Feeling depressed.  Restlessness and agitation.  Craving a  cigarette.  To manage withdrawal symptoms:  Avoid places, people, and activities that trigger your cravings.  Remember why you want to quit.  Get plenty of sleep.  Avoid coffee and other caffeinated drinks. These may worsen some of your symptoms.  How can I handle social situations? Social situations can be difficult when you are quitting smoking, especially in the first few weeks. To manage this, you can:  Avoid parties, bars, and other social situations where people might be smoking.  Avoid alcohol.  Leave right away if you have the urge to smoke.  Explain to your family and friends that you are quitting smoking. Ask for understanding and support.  Plan activities with friends or family where smoking is not an option.  What are some ways I can cope with stress? Wanting to smoke may cause stress, and stress can make you want to smoke. Find ways to manage your stress. Relaxation techniques can help. For example:  Breathe slowly and deeply, in through your nose and out through your mouth.  Listen to soothing, relaxing music.  Talk with a family member or friend about your stress.  Light a candle.  Soak in a bath or take a shower.  Think about a peaceful place.  What are some ways I can prevent weight gain? Be aware that many people gain weight after they quit smoking. However, not everyone does. To keep from gaining weight, have a plan in place before you quit and stick to the plan after you quit. Your plan should include:  Having healthy snacks. When you have a craving, it may help to: ? Eat plain popcorn, crunchy carrots, celery, or other cut vegetables. ? Chew sugar-free gum.  Changing how you eat: ? Eat small portion sizes at meals. ? Eat 4-6 small meals throughout the day instead of 1-2  large meals a day. ? Be mindful when you eat. Do not watch television or do other things that might distract you as you eat.  Exercising regularly: ? Make time to exercise each  day. If you do not have time for a long workout, do short bouts of exercise for 5-10 minutes several times a day. ? Do some form of strengthening exercise, like weight lifting, and some form of aerobic exercise, like running or swimming.  Drinking plenty of water or other low-calorie or no-calorie drinks. Drink 6-8 glasses of water daily, or as much as instructed by your health care provider.  Summary  Quitting smoking is a physical and mental challenge. You will face cravings, withdrawal symptoms, and temptation to smoke again. Preparation can help you as you go through these challenges.  You can cope with cravings by keeping your mouth busy (such as by chewing gum), keeping your body and hands busy, and making calls to family, friends, or a helpline for people who want to quit smoking.  You can cope with withdrawal symptoms by avoiding places where people smoke, avoiding drinks with caffeine, and getting plenty of rest.  Ask your health care provider about the different ways to prevent weight gain, avoid stress, and handle social situations. This information is not intended to replace advice given to you by your health care provider. Make sure you discuss any questions you have with your health care provider. Document Released: 11/04/2016 Document Revised: 11/04/2016 Document Reviewed: 11/04/2016 Elsevier Interactive Patient Education  [employer].

## 2018-01-31 NOTE — Patient Instructions (Addendum)
   IF you received an x-ray today, you will receive an invoice from [facility]. Please contact [facility] at [phone] with questions or concerns regarding your invoice.   IF you received labwork today, you will receive an invoice from [facility]. Please contact [facility] at [phone] with questions or concerns regarding your invoice.   Our billing staff will not be able to assist you with questions regarding bills from these companies.  You will be contacted with the lab results as soon as they are available. The fastest way to get your results is to activate your My Chart account. Instructions are located on the last page of this paperwork. If you have not heard from us regarding the results in 2 weeks, please contact this office.     Coping with Quitting Smoking Quitting smoking is a physical and mental challenge. You will face cravings, withdrawal symptoms, and temptation. Before quitting, work with your health care provider to make a plan that can help you cope. Preparation can help you quit and keep you from giving in. How can I cope with cravings? Cravings usually last for 5-10 minutes. If you get through it, the craving will pass. Consider taking the following actions to help you cope with cravings:  Keep your mouth busy: ? Chew sugar-free gum. ? Suck on hard candies or a straw. ? Brush your teeth.  Keep your hands and body busy: ? Immediately change to a different activity when you feel a craving. ? Squeeze or play with a ball. ? Do an activity or a hobby, like making bead jewelry, practicing needlepoint, or working with wood. ? Mix up your normal routine. ? Take a short exercise break. Go for a quick walk or run up and down stairs. ? Spend time in public places where smoking is not allowed.  Focus on doing something kind or helpful for someone else.  Call a friend or family member to talk during a craving.  Join a support group.  Call a quit  line, such as 1-800-QUIT-NOW.  Talk with your health care provider about medicines that might help you cope with cravings and make quitting easier for you.  How can I deal with withdrawal symptoms? Your body may experience negative effects as it tries to get used to not having nicotine in the system. These effects are called withdrawal symptoms. They may include:  Feeling hungrier than normal.  Trouble concentrating.  Irritability.  Trouble sleeping.  Feeling depressed.  Restlessness and agitation.  Craving a cigarette.  To manage withdrawal symptoms:  Avoid places, people, and activities that trigger your cravings.  Remember why you want to quit.  Get plenty of sleep.  Avoid coffee and other caffeinated drinks. These may worsen some of your symptoms.  How can I handle social situations? Social situations can be difficult when you are quitting smoking, especially in the first few weeks. To manage this, you can:  Avoid parties, bars, and other social situations where people might be smoking.  Avoid alcohol.  Leave right away if you have the urge to smoke.  Explain to your family and friends that you are quitting smoking. Ask for understanding and support.  Plan activities with friends or family where smoking is not an option.  What are some ways I can cope with stress? Wanting to smoke may cause stress, and stress can make you want to smoke. Find ways to manage your stress. Relaxation techniques can help. For example:  Breathe slowly and deeply, in   through your nose and out through your mouth.  Listen to soothing, relaxing music.  Talk with a family member or friend about your stress.  Light a candle.  Soak in a bath or take a shower.  Think about a peaceful place.  What are some ways I can prevent weight gain? Be aware that many people gain weight after they quit smoking. However, not everyone does. To keep from gaining weight, have a plan in place before  you quit and stick to the plan after you quit. Your plan should include:  Having healthy snacks. When you have a craving, it may help to: ? Eat plain popcorn, crunchy carrots, celery, or other cut vegetables. ? Chew sugar-free gum.  Changing how you eat: ? Eat small portion sizes at meals. ? Eat 4-6 small meals throughout the day instead of 1-2 large meals a day. ? Be mindful when you eat. Do not watch television or do other things that might distract you as you eat.  Exercising regularly: ? Make time to exercise each day. If you do not have time for a long workout, do short bouts of exercise for 5-10 minutes several times a day. ? Do some form of strengthening exercise, like weight lifting, and some form of aerobic exercise, like running or swimming.  Drinking plenty of water or other low-calorie or no-calorie drinks. Drink 6-8 glasses of water daily, or as much as instructed by your health care provider.  Summary  Quitting smoking is a physical and mental challenge. You will face cravings, withdrawal symptoms, and temptation to smoke again. Preparation can help you as you go through these challenges.  You can cope with cravings by keeping your mouth busy (such as by chewing gum), keeping your body and hands busy, and making calls to family, friends, or a helpline for people who want to quit smoking.  You can cope with withdrawal symptoms by avoiding places where people smoke, avoiding drinks with caffeine, and getting plenty of rest.  Ask your health care provider about the different ways to prevent weight gain, avoid stress, and handle social situations. This information is not intended to replace advice given to you by your health care provider. Make sure you discuss any questions you have with your health care provider. Document Released: 11/04/2016 Document Revised: 11/04/2016 Document Reviewed: 11/04/2016 Elsevier Interactive Patient Education  2018 [employer]  

## 2018-02-01 LAB — COMPREHENSIVE METABOLIC PANEL
ALBUMIN: 3.9 g/dL (ref 3.6–4.8)
ALK PHOS: 63 IU/L (ref 39–117)
ALT: 17 IU/L (ref 0–32)
AST: 16 IU/L (ref 0–40)
Albumin/Globulin Ratio: 1.5 (ref 1.2–2.2)
BUN/Creatinine Ratio: 13 (ref 12–28)
BUN: 10 mg/dL (ref 8–27)
Bilirubin Total: 0.3 mg/dL (ref 0.0–1.2)
CALCIUM: 9.5 mg/dL (ref 8.7–10.3)
CO2: 26 mmol/L (ref 20–29)
CREATININE: 0.79 mg/dL (ref 0.57–1.00)
Chloride: 103 mmol/L (ref 96–106)
GFR calc Af Amer: 92 mL/min/{1.73_m2} (ref >59)
GFR, EST NON AFRICAN AMERICAN: 80 mL/min/{1.73_m2} (ref >59)
GLOBULIN, TOTAL: 2.6 g/dL (ref 1.5–4.5)
Glucose: 96 mg/dL (ref 65–99)
Potassium: 4 mmol/L (ref 3.5–5.2)
SODIUM: 143 mmol/L (ref 134–144)
Total Protein: 6.5 g/dL (ref 6.0–8.5)

## 2018-02-01 LAB — LIPID PANEL
CHOL/HDL RATIO: 2.8 ratio (ref 0.0–4.4)
CHOLESTEROL TOTAL: 165 mg/dL (ref 100–199)
HDL: 59 mg/dL (ref >39)
LDL CALC: 98 mg/dL (ref 0–99)
TRIGLYCERIDES: 41 mg/dL (ref 0–149)
VLDL CHOLESTEROL CAL: 8 mg/dL (ref 5–40)

## 2018-02-01 LAB — VITAMIN D 25 HYDROXY (VIT D DEFICIENCY, FRACTURES): Vit D, 25-Hydroxy: 18.4 ng/mL — ABNORMAL LOW (ref 30.0–100.0)

## 2018-03-01 ENCOUNTER — Other Ambulatory Visit: Payer: Self-pay | Admitting: Family Medicine

## 2018-03-01 DIAGNOSIS — J302 Other seasonal allergic rhinitis: Secondary | ICD-10-CM

## 2018-03-05 ENCOUNTER — Ambulatory Visit: Payer: BLUE CROSS/BLUE SHIELD

## 2018-03-05 ENCOUNTER — Telehealth: Payer: Self-pay

## 2018-03-05 NOTE — Telephone Encounter (Signed)
patient came in as fast track to chk BP meds per note from 1.4.19. BP 120/82. No longer taking by meds

## 2018-03-06 ENCOUNTER — Encounter: Payer: Self-pay | Admitting: Family Medicine

## 2018-03-06 NOTE — Telephone Encounter (Signed)
BP Readings from Last 3 Encounters:  01/31/18 100/64  11/24/17 110/64  05/25/17 117/72   She should continue exercise and lifestyle modification. I will email the patient in mychart.

## 2018-03-09 ENCOUNTER — Other Ambulatory Visit: Payer: Self-pay | Admitting: Family Medicine

## 2018-03-09 DIAGNOSIS — J302 Other seasonal allergic rhinitis: Secondary | ICD-10-CM

## 2018-03-22 ENCOUNTER — Other Ambulatory Visit: Payer: Self-pay | Admitting: Dermatology

## 2018-04-17 ENCOUNTER — Encounter: Payer: Self-pay | Admitting: Family Medicine

## 2018-05-20 ENCOUNTER — Other Ambulatory Visit: Payer: Self-pay | Admitting: Family Medicine

## 2018-06-19 ENCOUNTER — Other Ambulatory Visit: Payer: Self-pay | Admitting: Family Medicine

## 2018-06-19 DIAGNOSIS — Z1231 Encounter for screening mammogram for malignant neoplasm of breast: Secondary | ICD-10-CM

## 2018-07-18 ENCOUNTER — Ambulatory Visit
Admission: RE | Admit: 2018-07-18 | Discharge: 2018-07-18 | Disposition: A | Discharge status: Home / Self Care | Payer: BLUE CROSS/BLUE SHIELD | Source: Ambulatory Visit | Attending: Family Medicine | Admitting: Family Medicine

## 2018-07-18 DIAGNOSIS — Z1231 Encounter for screening mammogram for malignant neoplasm of breast: Secondary | ICD-10-CM

## 2018-07-30 ENCOUNTER — Other Ambulatory Visit: Payer: Self-pay | Admitting: Family Medicine

## 2018-07-30 DIAGNOSIS — J302 Other seasonal allergic rhinitis: Secondary | ICD-10-CM

## 2018-08-16 ENCOUNTER — Telehealth: Payer: Self-pay | Admitting: Family Medicine

## 2018-08-28 ENCOUNTER — Other Ambulatory Visit: Payer: Self-pay

## 2018-08-28 MED ORDER — SIMVASTATIN 20 MG PO TABS: 20.0000 mg | ORAL_TABLET | Freq: Every day | ORAL | 0 refills | Status: DC

## 2018-08-28 NOTE — Telephone Encounter (Signed)
Patient called to request that her refill for simvastatin (ZOCOR) 20 MG tablet be sent to  [place], [place] [phone] (Phone) [phone] (Fax)    Apparently it was already sent to [facility] on [address].  Please correct and call patient when prescription is ready.  CB# [phone].

## 2018-08-28 NOTE — Telephone Encounter (Signed)
Rx resent to pharmacy

## 2018-08-30 ENCOUNTER — Other Ambulatory Visit: Payer: Self-pay

## 2018-08-30 ENCOUNTER — Ambulatory Visit: Payer: BLUE CROSS/BLUE SHIELD | Admitting: Emergency Medicine

## 2018-08-30 ENCOUNTER — Encounter: Payer: Self-pay | Admitting: Emergency Medicine

## 2018-08-30 VITALS — BP 125/80 | HR 72 | Temp 99.0°F | Resp 16 | Ht 63.25 in | Wt 146.6 lb

## 2018-08-30 DIAGNOSIS — K115 Sialolithiasis: Secondary | ICD-10-CM | POA: Insufficient documentation

## 2018-08-30 MED ORDER — HYDROXYZINE HCL 10 MG PO TABS: 10.0000 mg | ORAL_TABLET | ORAL | 3 refills | Status: DC | PRN

## 2018-08-30 NOTE — Progress Notes (Signed)
Tiffany Gonzales 62 y.o.   Chief Complaint  Patient presents with  . Mass    NECK x 1 MONTH WITH SWELLING on the left side  . Medication Refill    hydroxyzine    HISTORY OF PRESENT ILLNESS: This is a 62 y.o. female complaining of intermittent swelling to left side of the neck on and off for the past month.  Worse after eating.  Happened again today after lunch and is now better.  No associated symptoms.  HPI   Prior to Admission medications   Medication Sig Start Date End Date Taking? Authorizing Provider  aspirin 81 MG tablet Take 81 mg by mouth daily.     Yes [provider]  hydrOXYzine (ATARAX/VISTARIL) 10 MG tablet Take 10 mg by mouth as needed.   Yes [provider]  levocetirizine (XYZAL) 5 MG tablet Take 5 mg by mouth daily.   Yes [provider]  mometasone (ELOCON) 0.1 % cream Apply 1 application topically as needed.   Yes [provider]  montelukast (SINGULAIR) 10 MG tablet TAKE 1 TABLET BY MOUTH AT BEDTIME. GENERIC EQUIVALENT FOR SINGULAIR 07/31/18  Yes Stallings, Zoe A, MD  simvastatin (ZOCOR) 20 MG tablet Take 1 tablet (20 mg total) by mouth at bedtime. 08/28/18  Yes Forrest Moron, MD  nicotine polacrilex (NICOTINE MINI) 4 MG lozenge Take 1 lozenge (4 mg total) by mouth as needed for smoking cessation. Patient not taking: Reported on 08/30/2018 01/31/18   Forrest Moron, MD    Allergies  Allergen Reactions  . Iodinated Diagnostic Agents Shortness Of Breath  . Lisinopril Hives  . Shellfish Allergy     Patient Active Problem List   Diagnosis Date Noted  . Breast abscess 04/08/2015  . Memory loss 12/09/2013  . History of insect sting allergy 11/18/2012  . Post-menopause atrophic vaginitis 09/03/2012  . Allergic reaction 06/05/2012  . OSA (obstructive sleep apnea) 11/08/2011  . Cervical disc disease 06/28/2011  . TOBACCO ABUSE 03/01/2010  . LEUKOPENIA, MILD 12/10/2008  . HYPERCHOLESTEROLEMIA 12/09/2008  . Essential  hypertension 03/05/2008  . DEPRESSIVE DISORDER 11/13/2007    Past Medical History:  Diagnosis Date  . Allergy    seasonal  . Atypical chest pain 01/2007   negative stress test  . Colon polyps   . Depression   . Fibroids   . Hyperlipidemia   . Hypertension   . Panic attacks     Past Surgical History:  Procedure Laterality Date  . ABDOMINAL HYSTERECTOMY  1998  . CATARACT EXTRACTION    . NASAL SINUS SURGERY  2007   Dr Mikey Bussing out area to help breath    Social History   Socioeconomic History  . Marital status: Single    Spouse name: Not on file  . Number of children: 2  . Years of education: Not on file  . Highest education level: Not on file  Occupational History  . Occupation: Hydrologist: [employer]  . Financial resource strain: Not on file  . Food insecurity:    Worry: Not on file    Inability: Not on file  . Transportation needs:    Medical: Not on file    Non-medical: Not on file  Tobacco Use  . Smoking status: Current Every Day Smoker    Packs/day: 0.30    Years: 34.00    Pack years: 10.20    Types: Cigarettes  . Smokeless tobacco: Never Used  . Tobacco  comment: 7 cigs daily 08/20/13  Substance and Sexual Activity  . Alcohol use: Yes    Alcohol/week: 1.0 standard drinks    Types: 1 Glasses of wine per week    Comment: white wine  . Drug use: No  . Sexual activity: Yes    Birth control/protection: Surgical  Lifestyle  . Physical activity:    Days per week: Not on file    Minutes per session: Not on file  . Stress: Not on file  Relationships  . Social connections:    Talks on phone: Not on file    Gets together: Not on file    Attends religious service: Not on file    Active member of club or organization: Not on file    Attends meetings of clubs or organizations: Not on file    Relationship status: Not on file  . Intimate partner violence:    Fear of current or ex partner: Not on file     Emotionally abused: Not on file    Physically abused: Not on file    Forced sexual activity: Not on file  Other Topics Concern  . 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 Arthritis Mother   . Hypertension Father   . Heart disease Maternal Grandmother   . Heart disease Paternal Grandmother      Review of Systems  Constitutional: Negative.  Negative for chills and fever.  HENT: Negative.  Negative for nosebleeds, sinus pain and sore throat.   Eyes: Negative for blurred vision and double vision.  Respiratory: Negative for shortness of breath.   Gastrointestinal: Negative for nausea and vomiting.  Skin: Negative.   Neurological: Negative for dizziness and headaches.  Endo/Heme/Allergies: Negative.       Vitals:   08/30/18 1631  BP: 125/80  Pulse: 72  Resp: 16  Temp: 99 F (37.2 C)  SpO2: 97%    Physical Exam  Constitutional: She is oriented to person, place, and time. She appears well-developed and well-nourished.  HENT:  Head: Normocephalic and atraumatic.  Mouth/Throat: Oropharynx is clear and moist.  Eyes: Pupils are equal, round, and reactive to light. EOM are normal.  Neck: Normal range of motion. Neck supple.  Mild swelling of left submandibular gland with very mild tenderness no erythema.  Cardiovascular: Normal rate.  Pulmonary/Chest: Effort normal and breath sounds normal.  Musculoskeletal: Normal range of motion.  Neurological: She is alert and oriented to person, place, and time.  Skin: Skin is warm and dry. Capillary refill takes less than 2 seconds.  Psychiatric: She has a normal mood and affect. Her behavior is normal.  Vitals reviewed.   A total of 25 minutes was spent in the room with the patient, greater than 50% of which was in counseling/coordination of care regarding differential diagnosis, management,  medications, prognosis and need for follow-up if no better or worse.  ASSESSMENT & PLAN: Tiffany Gonzales was seen today for mass and medication refill.  Diagnoses and all orders for this visit:  Sialolithiasis  Other orders -     hydrOXYzine (ATARAX/VISTARIL) 10 MG tablet; Take 1 tablet (10 mg total) by mouth as needed.   Patient Instructions       If you have lab work done today you will be contacted with your lab results within the next 2 weeks.  If you have  not heard from [place] then please contact us. The fastest way to get your results is to register for My Chart.   IF you received an x-ray today, you will receive an invoice from [facility]. Please contact [facility] at [phone] with questions or concerns regarding your invoice.   IF you received labwork today, you will receive an invoice from [facility]. Please contact [facility] at [phone] with questions or concerns regarding your invoice.   Our billing staff will not be able to assist you with questions regarding bills from these companies.  You will be contacted with the lab results as soon as they are available. The fastest way to get your results is to activate your My Chart account. Instructions are located on the last page of this paperwork. If you have not heard from [place] regarding the results in 2 weeks, please contact this office.    Salivary Stone A salivary stone is a mineral deposit that builds up in the ducts that drain your salivary glands. Most salivary gland stones are made of calcium. When a stone forms, saliva can back up into the gland and cause painful swelling. Your salivary glands are the glands that produce spit (saliva). You have six major salivary glands. Each gland has a duct that carries saliva into your mouth. Saliva keeps your mouth moist and breaks down the food that you eat. It also helps to prevent tooth decay. Two salivary glands are located just in front of your ears (parotid).  The ducts for these glands open up inside your cheeks, near your back teeth. You also have two glands under your tongue (sublingual) and two glands under your jaw (submandibular). The ducts for these glands open under your tongue. A stone can form in any salivary gland. The most common place for a salivary stone to develop is in a submandibular salivary gland. What are the causes? Any condition that reduces the flow of saliva may lead to stone formation. It is not known why some people form stones and others do not. What increases the risk? You may be more likely to develop a salivary stone if you:  Are female.  Do not drink enough water.  Smoke.  Have high blood pressure.  Have gout.  Have diabetes.  What are the signs or symptoms? The main sign of a salivary gland stone is sudden swelling of a salivary gland when eating. This usually happens under the jaw on one side. Other signs and symptoms include:  Swelling of the cheek or under the tongue when eating.  Pain in the swollen area.  Trouble chewing or swallowing.  Swelling that goes down after eating.  How is this diagnosed? Your health care provider may diagnose a salivary gland stone based on your signs and symptoms. The health care provider will also do a physical exam. In many cases, a stone can be felt in a duct inside your mouth. You may need to see an ear, nose, and throat specialist (ENT or otolaryngologist) for diagnosis and treatment. You may also need to have diagnostic tests. These may include imaging studies to check for a stone, such as:  X-rays.  Ultrasound.  CT scan.  MRI.  How is this treated? Home care may be enough to treat a small stone that is not causing symptoms. Treatment of a stone that is large enough to cause symptoms may include:  Probing and widening the duct to allow the stone to pass.  Inserting a thin, flexible scope (endoscope) into  the duct to locate and remove the stone.  Breaking up  the stone with sound waves.  Removing the entire salivary gland.  Follow these instructions at home:  Drink enough fluid to keep your urine clear or pale yellow.  Follow these instructions every few hours: ? Suck on a lemon candy to stimulate the flow of saliva. ? Put a hot compress over the gland. ? Gently massage the gland.  Do not use any tobacco products, including cigarettes, chewing tobacco, or electronic cigarettes. If you need help quitting, ask your health care provider. Contact a health care provider if:  You have pain and swelling in your face, jaw, or mouth after eating.  You have persistent swelling in any of these places: ? In front of your ear. ? Under your jaw. ? Inside your mouth. Get help right away if:  You have pain and swelling in your face, jaw, or mouth that are getting worse.  Your pain and swelling make it hard to swallow or breathe. This information is not intended to replace advice given to you by your health care provider. Make sure you discuss any questions you have with your health care provider. Document Released: 12/15/2004 Document Revised: 04/14/2016 Document Reviewed: 04/09/2014 Elsevier Interactive Patient Education  2018 Elsevier Inc.      Agustina Caroli, MD [facility]

## 2018-08-30 NOTE — Patient Instructions (Addendum)
If you have lab work done today you will be contacted with your lab results within the next 2 weeks.  If you have not heard from [place] then please contact us. The fastest way to get your results is to register for My Chart.   IF you received an x-ray today, you will receive an invoice from [facility]. Please contact [facility] at [phone] with questions or concerns regarding your invoice.   IF you received labwork today, you will receive an invoice from [facility]. Please contact [facility] at [phone] with questions or concerns regarding your invoice.   Our billing staff will not be able to assist you with questions regarding bills from these companies.  You will be contacted with the lab results as soon as they are available. The fastest way to get your results is to activate your My Chart account. Instructions are located on the last page of this paperwork. If you have not heard from [place] regarding the results in 2 weeks, please contact this office.    Salivary Stone A salivary stone is a mineral deposit that builds up in the ducts that drain your salivary glands. Most salivary gland stones are made of calcium. When a stone forms, saliva can back up into the gland and cause painful swelling. Your salivary glands are the glands that produce spit (saliva). You have six major salivary glands. Each gland has a duct that carries saliva into your mouth. Saliva keeps your mouth moist and breaks down the food that you eat. It also helps to prevent tooth decay. Two salivary glands are located just in front of your ears (parotid). The ducts for these glands open up inside your cheeks, near your back teeth. You also have two glands under your tongue (sublingual) and two glands under your jaw (submandibular). The ducts for these glands open under your tongue. A stone can form in any salivary gland. The most common place for a salivary stone to develop is in a submandibular salivary  gland. What are the causes? Any condition that reduces the flow of saliva may lead to stone formation. It is not known why some people form stones and others do not. What increases the risk? You may be more likely to develop a salivary stone if you:  Are female.  Do not drink enough water.  Smoke.  Have high blood pressure.  Have gout.  Have diabetes.  What are the signs or symptoms? The main sign of a salivary gland stone is sudden swelling of a salivary gland when eating. This usually happens under the jaw on one side. Other signs and symptoms include:  Swelling of the cheek or under the tongue when eating.  Pain in the swollen area.  Trouble chewing or swallowing.  Swelling that goes down after eating.  How is this diagnosed? Your health care provider may diagnose a salivary gland stone based on your signs and symptoms. The health care provider will also do a physical exam. In many cases, a stone can be felt in a duct inside your mouth. You may need to see an ear, nose, and throat specialist (ENT or otolaryngologist) for diagnosis and treatment. You may also need to have diagnostic tests. These may include imaging studies to check for a stone, such as:  X-rays.  Ultrasound.  CT scan.  MRI.  How is this treated? Home care may be enough to treat a small stone that is not causing symptoms. Treatment of a stone that is  large enough to cause symptoms may include:  Probing and widening the duct to allow the stone to pass.  Inserting a thin, flexible scope (endoscope) into the duct to locate and remove the stone.  Breaking up the stone with sound waves.  Removing the entire salivary gland.  Follow these instructions at home:  Drink enough fluid to keep your urine clear or pale yellow.  Follow these instructions every few hours: ? Suck on a lemon candy to stimulate the flow of saliva. ? Put a hot compress over the gland. ? Gently massage the gland.  Do not use any  tobacco products, including cigarettes, chewing tobacco, or electronic cigarettes. If you need help quitting, ask your health care provider. Contact a health care provider if:  You have pain and swelling in your face, jaw, or mouth after eating.  You have persistent swelling in any of these places: ? In front of your ear. ? Under your jaw. ? Inside your mouth. Get help right away if:  You have pain and swelling in your face, jaw, or mouth that are getting worse.  Your pain and swelling make it hard to swallow or breathe. This information is not intended to replace advice given to you by your health care provider. Make sure you discuss any questions you have with your health care provider. Document Released: 12/15/2004 Document Revised: 04/14/2016 Document Reviewed: 04/09/2014 Elsevier Interactive Patient Education  2018 [employer].

## 2018-09-17 ENCOUNTER — Ambulatory Visit: Payer: BLUE CROSS/BLUE SHIELD | Admitting: Physician Assistant

## 2018-09-17 ENCOUNTER — Ambulatory Visit (INDEPENDENT_AMBULATORY_CARE_PROVIDER_SITE_OTHER): Payer: BLUE CROSS/BLUE SHIELD

## 2018-09-17 ENCOUNTER — Encounter: Payer: Self-pay | Admitting: Physician Assistant

## 2018-09-17 ENCOUNTER — Other Ambulatory Visit: Payer: Self-pay

## 2018-09-17 VITALS — BP 135/81 | HR 76 | Temp 98.4°F | Resp 12 | Ht 64.0 in | Wt 145.4 lb

## 2018-09-17 DIAGNOSIS — W19XXXA Unspecified fall, initial encounter: Secondary | ICD-10-CM

## 2018-09-17 DIAGNOSIS — M25572 Pain in left ankle and joints of left foot: Secondary | ICD-10-CM

## 2018-09-17 DIAGNOSIS — S93492A Sprain of other ligament of left ankle, initial encounter: Secondary | ICD-10-CM

## 2018-09-17 NOTE — Progress Notes (Signed)
Tiffany Gonzales  MRN: [id number] DOB: 1956/03/18  Subjective:   Tiffany Gonzales is a 62 y.o. female who presents with left ankle pain. Onset of the symptoms was yesterday. Inciting event: inverted in a hole while walking. Current symptoms include: ability to bear weight, but with some pain, bruising and swelling. Denies numbness, tingling, fever, and chills. Aggravating factors: weight bearing. Symptoms have stabilized. Patient has had no prior ankle problems. Evaluation to date: none. Treatment to date: ice and OTC analgesics which are effective.  Review of Systems  Per HPI Patient Active Problem List   Diagnosis Date Noted  . Sialolithiasis 08/30/2018  . Breast abscess 04/08/2015  . Memory loss 12/09/2013  . History of insect sting allergy 11/18/2012  . Post-menopause atrophic vaginitis 09/03/2012  . Allergic reaction 06/05/2012  . OSA (obstructive sleep apnea) 11/08/2011  . Cervical disc disease 06/28/2011  . TOBACCO ABUSE 03/01/2010  . LEUKOPENIA, MILD 12/10/2008  . HYPERCHOLESTEROLEMIA 12/09/2008  . Essential hypertension 03/05/2008  . DEPRESSIVE DISORDER 11/13/2007    Current Outpatient Medications on File Prior to Visit  Medication Sig Dispense Refill  . aspirin 81 MG tablet Take 81 mg by mouth daily.      . hydrOXYzine (ATARAX/VISTARIL) 10 MG tablet Take 1 tablet (10 mg total) by mouth as needed. 30 tablet 3  . levocetirizine (XYZAL) 5 MG tablet Take 5 mg by mouth daily.    . mometasone (ELOCON) 0.1 % cream Apply 1 application topically as needed.    . montelukast (SINGULAIR) 10 MG tablet TAKE 1 TABLET BY MOUTH AT BEDTIME. GENERIC EQUIVALENT FOR SINGULAIR 90 tablet 0  . nicotine polacrilex (NICOTINE MINI) 4 MG lozenge Take 1 lozenge (4 mg total) by mouth as needed for smoking cessation. (Patient not taking: Reported on 08/30/2018) 100 tablet 0  . simvastatin (ZOCOR) 20 MG tablet Take 1 tablet (20 mg total) by mouth at bedtime. 90 tablet 0   No current  facility-administered medications on file prior to visit.     Allergies  Allergen Reactions  . Iodinated Diagnostic Agents Shortness Of Breath  . Lisinopril Hives  . Shellfish Allergy      Objective:  BP 135/81 (BP Location: Right Arm, Patient Position: Sitting, Cuff Size: Large)   Pulse 76   Temp 98.4 F (36.9 C) (Oral)   Resp 12   Ht 5\' 4"  (1.626 m)   Wt 145 lb 6.4 oz (66 kg)   SpO2 96%   BMI 24.96 kg/m   Physical Exam  Constitutional: She is oriented to person, place, and time. She appears well-developed and well-nourished.  HENT:  Head: Normocephalic and atraumatic.  Eyes: Conjunctivae are normal.  Neck: Normal range of motion.  Cardiovascular:  Pulses:      Dorsalis pedis pulses are 2+ on the right side, and 2+ on the left side.       Posterior tibial pulses are 2+ on the right side, and 2+ on the left side.  Pulmonary/Chest: Effort normal.  Musculoskeletal:       Left ankle: She exhibits decreased range of motion, swelling and ecchymosis (overlying ATFL). Tenderness. AITFL tenderness found. No lateral malleolus, no medial malleolus, no CF ligament, no posterior TFL, no head of 5th metatarsal and no proximal fibula tenderness found. Achilles tendon normal.  Neurological: She is alert and oriented to person, place, and time.  Sensation BLE intact.   Skin: Skin is warm and dry.  Psychiatric: She has a normal mood and affect.  Vitals reviewed.  Dg Ankle Complete Left  Result Date: 09/17/2018 CLINICAL DATA:  Fall.  Initial encounter. EXAM: LEFT ANKLE COMPLETE - 3+ VIEW COMPARISON:  None. FINDINGS: There is no evidence of fracture, dislocation, or joint effusion. There is no evidence of arthropathy or other focal bone abnormality. Soft tissues are unremarkable. IMPRESSION: Negative. Electronically Signed   By: San Morelle M.D.   On: 09/17/2018 11:31    Assessment and Plan :  1. Sprain of anterior talofibular ligament of left ankle, initial  encounter Consistent with sprain. She is NVI. Rec RICE. Given education material for ankle sports rehab. F/u as needed.  - Apply ASO ankle - Crutches  2. Fall, initial encounter - DG Ankle Complete Left    Tenna Delaine PA-C  [facility] 09/17/2018 11:38 AM

## 2018-09-17 NOTE — Patient Instructions (Addendum)
I recommend rest, ice, ibuprofen as prescribed, compression, and elevation. Start exercises below once your pain improves. -Return to clinic if symptoms worsen, do not improve, or as needed    If you have lab work done today you will be contacted with your lab results within the next 2 weeks.  If you have not heard from [place] then please contact us. The fastest way to get your results is to register for My Chart.  Ankle Sprain, Phase I Rehab Ask your health care provider which exercises are safe for you. Do exercises exactly as told by your health care provider and adjust them as directed. It is normal to feel mild stretching, pulling, tightness, or discomfort as you do these exercises, but you should stop right away if you feel sudden pain or your pain gets worse.Do not begin these exercises until told by your health care provider. Stretching and range of motion exercises These exercises warm up your muscles and joints and improve the movement and flexibility of your lower leg and ankle. These exercises also help to relieve pain and stiffness. Exercise A: Gastroc and soleus stretch  1. Sit on the floor with your left / right leg extended. 2. Loop a belt or towel around the ball of your left / right foot. The ball of your foot is on the walking surface, right under your toes. 3. Keep your left / right ankle and foot relaxed and keep your knee straight while you use the belt or towel to pull your foot toward you. You should feel a gentle stretch behind your calf or knee. 4. Hold this position for __________ seconds, then release to the starting position. Repeat the exercise with your knee bent. You can put a pillow or a rolled bath towel under your knee to support it. You should feel a stretch deep in your calf or at your Achilles tendon. Repeat each stretch __________ times. Complete these stretches __________ times a day. Exercise B: Ankle alphabet  1. Sit with your left / right leg supported at  the lower leg. ? Do not rest your foot on anything. ? Make sure your foot has room to move freely. 2. Think of your left / right foot as a paintbrush, and move your foot to trace each letter of the alphabet in the air. Keep your hip and knee still while you trace. Make the letters as large as you can without feeling discomfort. 3. Trace every letter from A to Z. Repeat __________ times. Complete this exercise __________ times a day. Strengthening exercises These exercises build strength and endurance in your ankle and lower leg. Endurance is the ability to use your muscles for a long time, even after they get tired. Exercise C: Dorsiflexors  1. Secure a rubber exercise band or tube to an object, such as a table leg, that will stay still when the band is pulled. Secure the other end around your left / right foot. 2. Sit on the floor facing the object, with your left / right leg extended. The band or tube should be slightly tense when your foot is relaxed. 3. Slowly bring your foot toward you, pulling the band tighter. 4. Hold this position for __________ seconds. 5. Slowly return your foot to the starting position. Repeat __________ times. Complete this exercise __________ times a day. Exercise D: Plantar flexors  1. Sit on the floor with your left / right leg extended. 2. Loop a rubber exercise tube or band around the ball of  your left / right foot. The ball of your foot is on the walking surface, right under your toes. ? Hold the ends of the band or tube in your hands. ? The band or tube should be slightly tense when your foot is relaxed. 3. Slowly point your foot and toes downward, pushing them away from you. 4. Hold this position for __________ seconds. 5. Slowly return your foot to the starting position. Repeat __________ times. Complete this exercise __________ times a day. Exercise E: Evertors 1. Sit on the floor with your legs straight out in front of you. 2. Loop a rubber exercise  band or tube around the ball of your left / right foot. The ball of your foot is on the walking surface, right under your toes. ? Hold the ends of the band in your hands, or secure the band to a stable object. ? The band or tube should be slightly tense when your foot is relaxed. 3. Slowly push your foot outward, away from your other leg. 4. Hold this position for __________ seconds. 5. Slowly return your foot to the starting position. Repeat __________ times. Complete this exercise __________ times a day. This information is not intended to replace advice given to you by your health care provider. Make sure you discuss any questions you have with your health care provider. Document Released: 06/08/2005 Document Revised: 07/14/2016 Document Reviewed: 09/21/2015 Elsevier Interactive Patient Education  2018 [employer].   IF you received an x-ray today, you will receive an invoice from [facility]. Please contact [facility] at [phone] with questions or concerns regarding your invoice.   IF you received labwork today, you will receive an invoice from [place]. Please contact [facility] at [phone] with questions or concerns regarding your invoice.   Our billing staff will not be able to assist you with questions regarding bills from these companies.  You will be contacted with the lab results as soon as they are available. The fastest way to get your results is to activate your My Chart account. Instructions are located on the last page of this paperwork. If you have not heard from [place] regarding the results in 2 weeks, please contact this office.

## 2018-09-30 IMAGING — MG DIGITAL SCREENING BILATERAL MAMMOGRAM WITH CAD
4 series · 4 of 4 positions shown · non-contrast
Comparison: Previous exam(s).

CLINICAL DATA: Screening.

EXAM:
DIGITAL SCREENING BILATERAL MAMMOGRAM WITH CAD

[L CC]
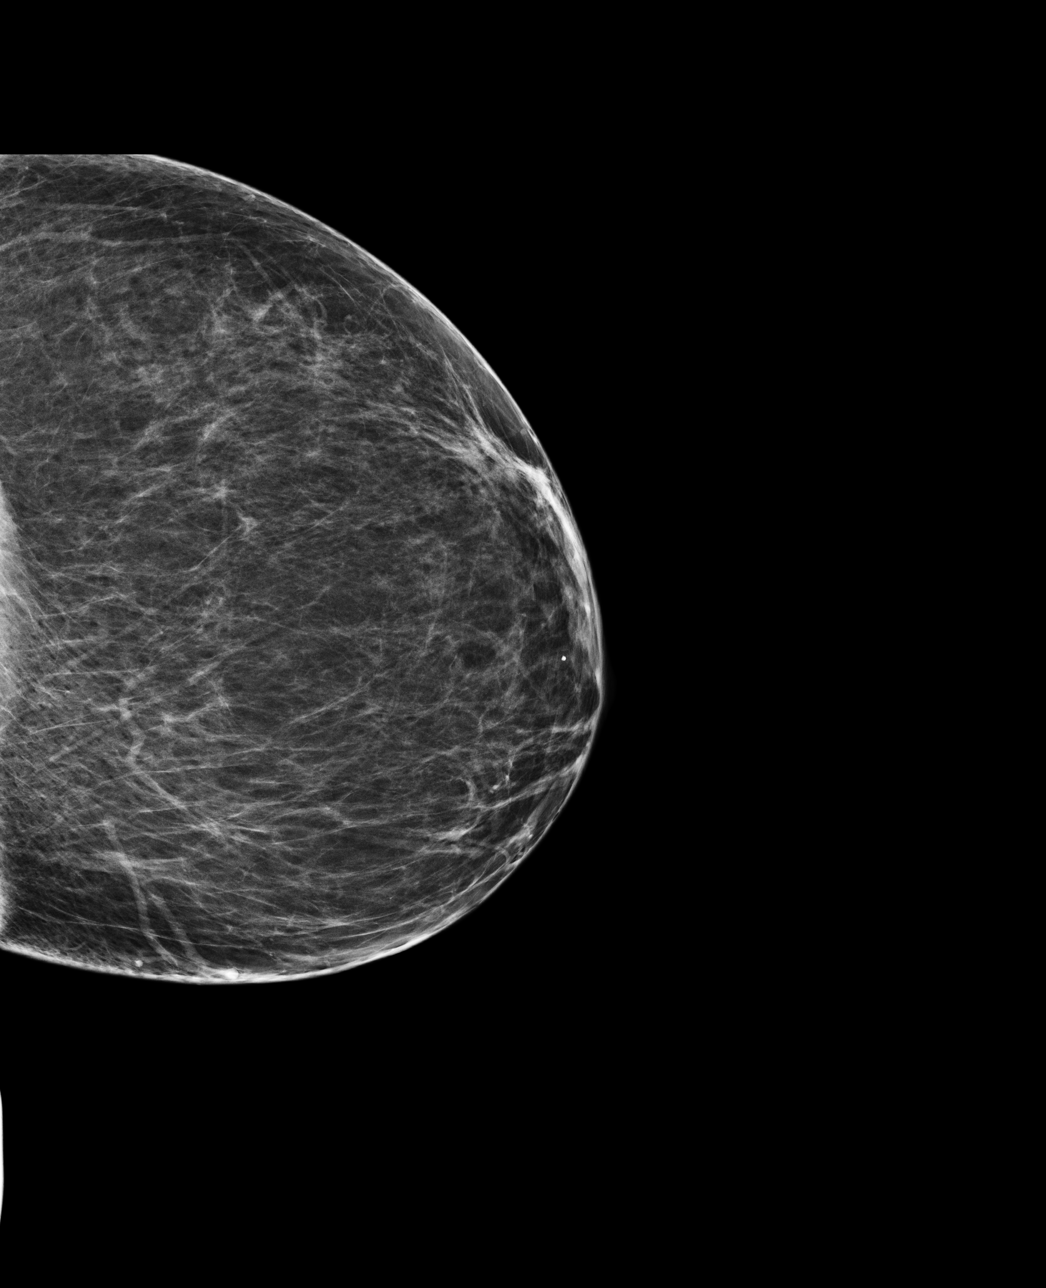

[R MLO]
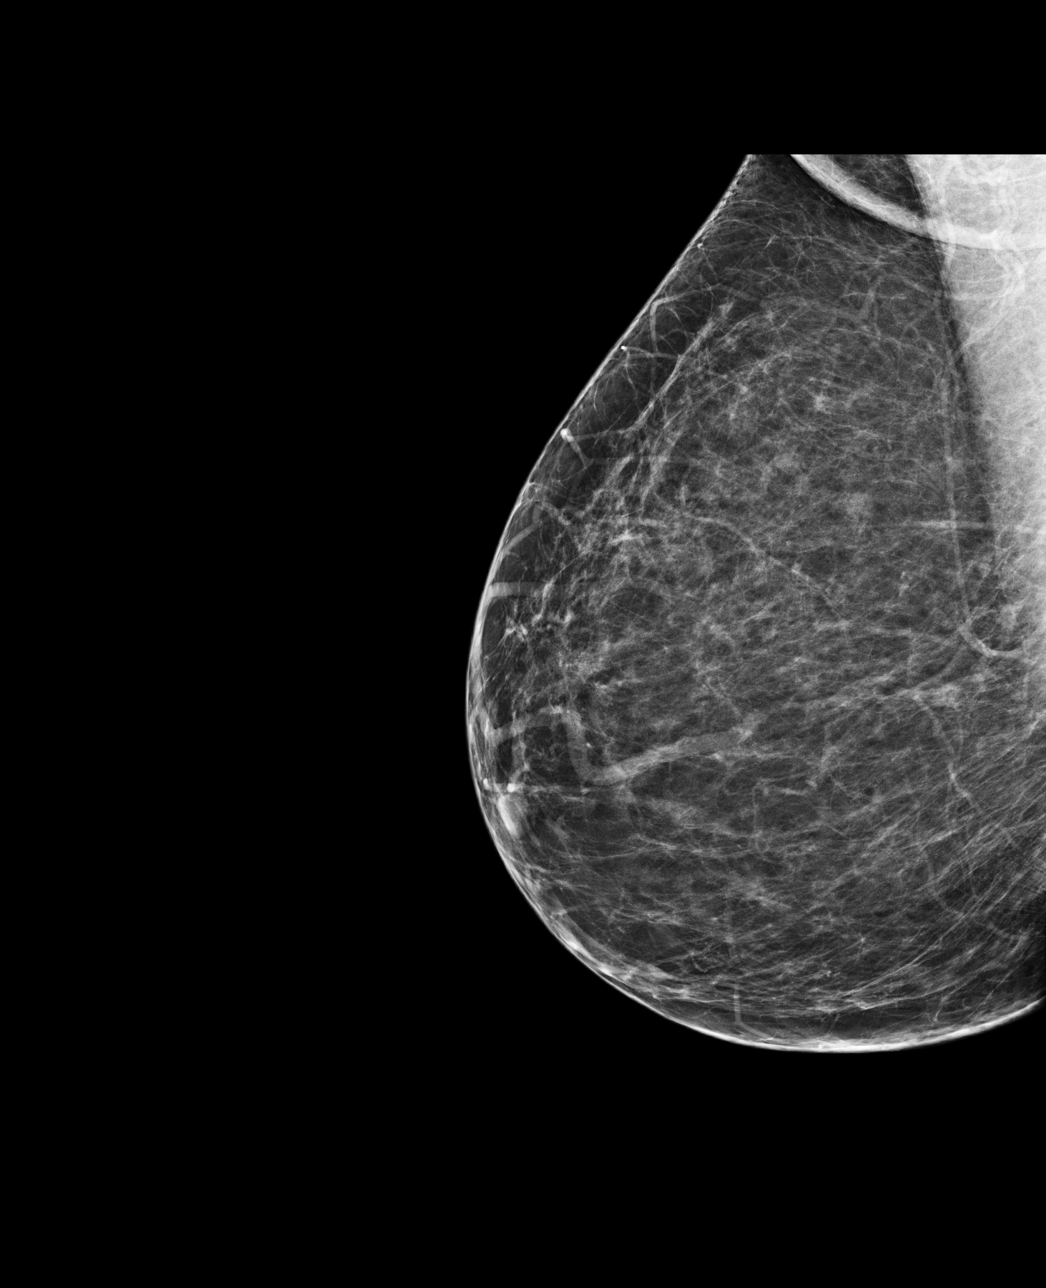

[L MLO]
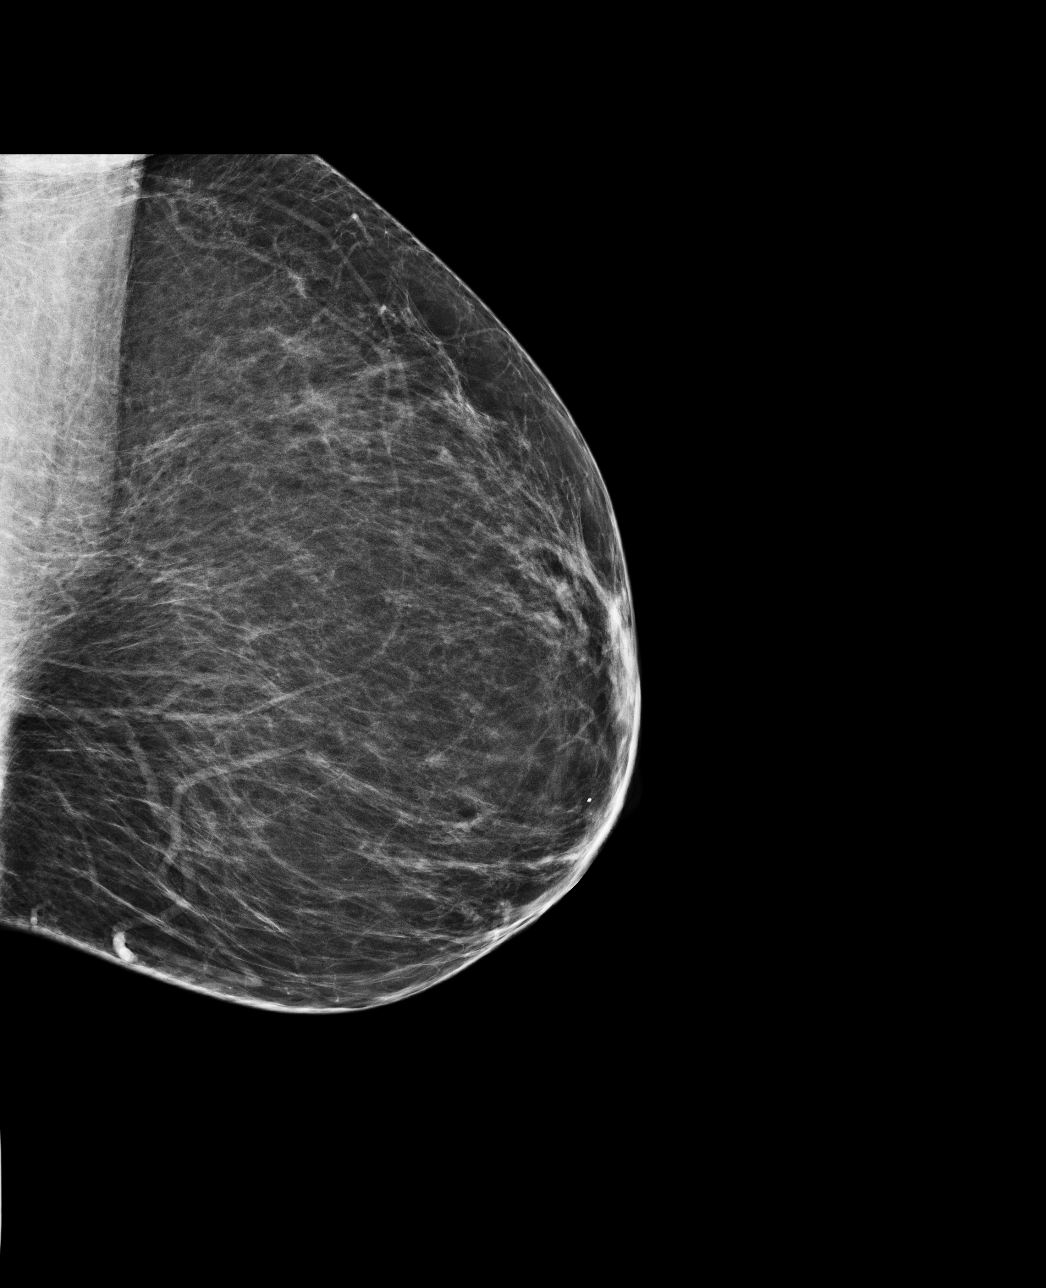

[R CC]
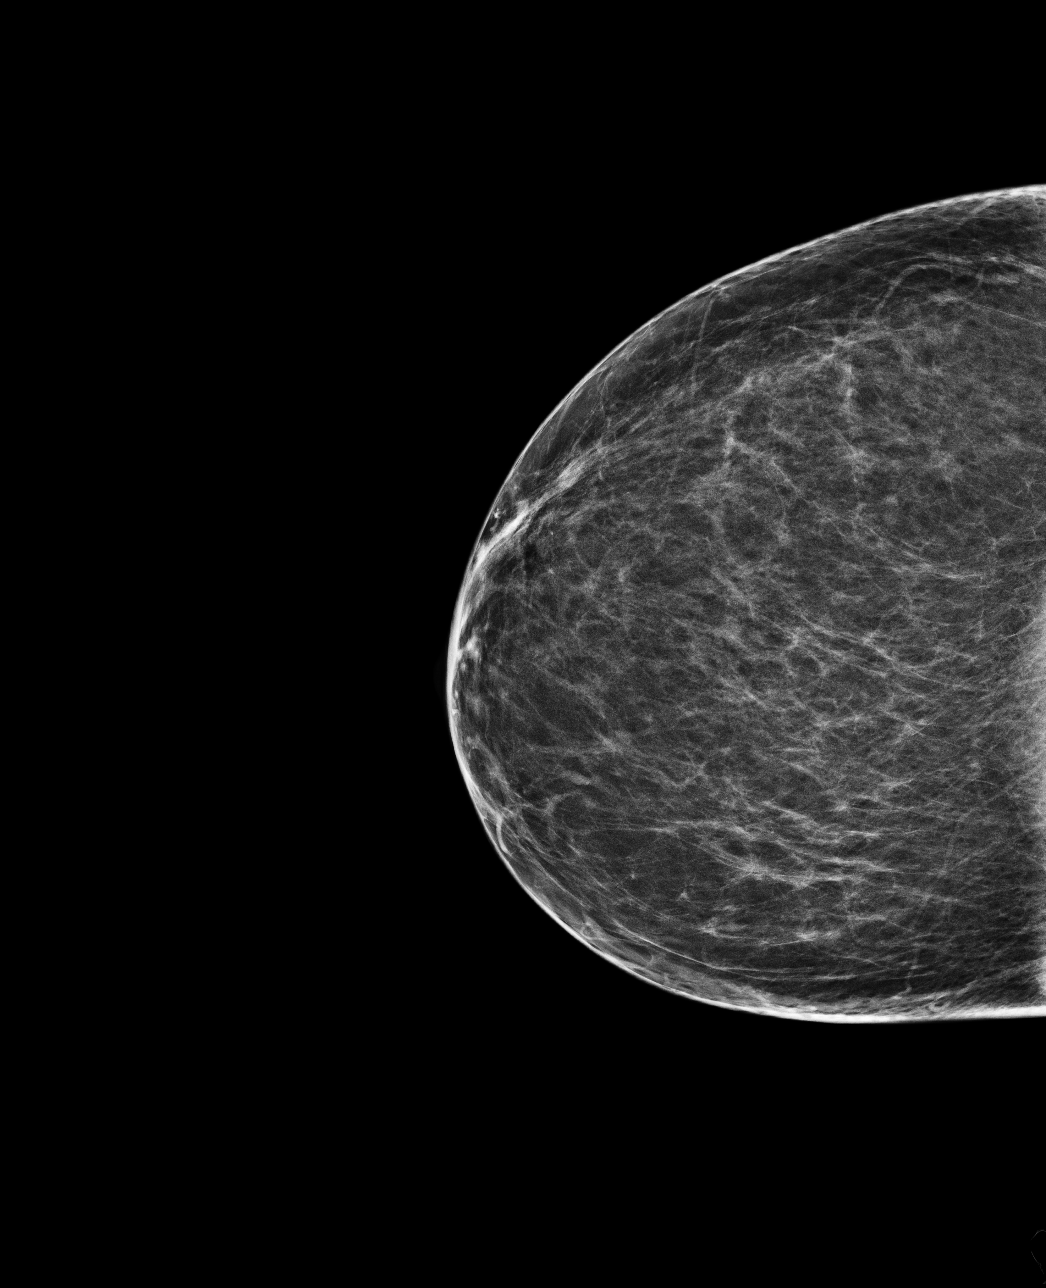

[4 of 4 positions shown; findings below may reference images not displayed]

ACR Breast Density Category b: There are scattered areas of
fibroglandular density.
FINDINGS: In the right breast, a possible asymmetry warrants further
evaluation. This possible asymmetry is seen within the retroareolar
right breast, at posterior depth, on the MLO projection only.

In the left breast, no findings suspicious for malignancy. Images
were processed with CAD.
IMPRESSION: Further evaluation is suggested for possible asymmetry in the right
breast.

RECOMMENDATION:
Diagnostic mammogram and possibly ultrasound of the right breast.
(Code:50-2-PPC)

The patient will be contacted regarding the findings, and additional
imaging will be scheduled.

BI-RADS CATEGORY  0: Incomplete. Need additional imaging evaluation
and/or prior mammograms for comparison.

## 2018-10-21 ENCOUNTER — Other Ambulatory Visit: Payer: Self-pay | Admitting: Family Medicine

## 2018-10-21 DIAGNOSIS — J302 Other seasonal allergic rhinitis: Secondary | ICD-10-CM

## 2018-10-22 ENCOUNTER — Other Ambulatory Visit: Payer: Self-pay | Admitting: Family Medicine

## 2018-10-22 DIAGNOSIS — J302 Other seasonal allergic rhinitis: Secondary | ICD-10-CM

## 2018-10-23 NOTE — Telephone Encounter (Signed)
Requested Prescriptions  Pending Prescriptions Disp Refills  . montelukast (SINGULAIR) 10 MG tablet [Pharmacy Med Name: MONTELUKAST 10MG  TABLETS] 90 tablet 0    Sig: TAKE 1 TABLET BY MOUTH AT BEDTIME. GENERIC EQUIVALENT FOR SINGULAIR     Pulmonology:  Leukotriene Inhibitors Passed - 10/22/2018  9:13 AM      Passed - Valid encounter within last 12 months    Recent Outpatient Visits          1 month ago Sprain of anterior talofibular ligament of left ankle, initial encounter   Primary Care at [facility], [place] D, PA-C   1 month ago Sialolithiasis   Primary Care at [facility], Ines Bloomer, MD   8 months ago Health maintenance examination   Primary Care at [facility], Arlie Solomons, MD   11 months ago Essential hypertension   Primary Care at [facility], Arlie Solomons, MD   1 year ago Sialadenitis   Primary Care at [facility], Arlie Solomons, MD

## 2018-11-18 ENCOUNTER — Other Ambulatory Visit: Payer: Self-pay | Admitting: Family Medicine

## 2019-01-11 ENCOUNTER — Other Ambulatory Visit: Payer: Self-pay | Admitting: Family Medicine

## 2019-01-11 NOTE — Telephone Encounter (Signed)
Requested Prescriptions  Pending Prescriptions Disp Refills  . montelukast (SINGULAIR) 10 MG tablet [Pharmacy Med Name: MONTELUKAST 10MG  TABLETS] 90 tablet 0    Sig: TAKE 1 TABLET BY MOUTH AT BEDTIME. GENERIC EQUIVALENT FOR SINGULAIR     Pulmonology:  Leukotriene Inhibitors Passed - 01/11/2019  9:18 AM      Passed - Valid encounter within last 12 months    Recent Outpatient Visits          3 months ago Sprain of anterior talofibular ligament of left ankle, initial encounter   Primary Care at [facility], [place] D, PA-C   4 months ago Sialolithiasis   Primary Care at [facility], Ines Bloomer, MD   11 months ago Health maintenance examination   Primary Care at [facility], Arlie Solomons, MD   1 year ago Essential hypertension   Primary Care at [facility], Arlie Solomons, MD   1 year ago Sialadenitis   Primary Care at [place], MD      Future Appointments            In 1 month Forrest Moron, MD Primary Care at [place], [facility]

## 2019-02-09 ENCOUNTER — Other Ambulatory Visit: Payer: Self-pay | Admitting: Family Medicine

## 2019-02-10 NOTE — Telephone Encounter (Signed)
30 day courtesy refill given until appt on 03/01/19

## 2019-02-22 ENCOUNTER — Other Ambulatory Visit: Payer: Self-pay | Admitting: *Deleted

## 2019-02-22 DIAGNOSIS — Z131 Encounter for screening for diabetes mellitus: Secondary | ICD-10-CM

## 2019-02-22 DIAGNOSIS — Z0000 Encounter for general adult medical examination without abnormal findings: Secondary | ICD-10-CM

## 2019-02-22 DIAGNOSIS — I10 Essential (primary) hypertension: Secondary | ICD-10-CM

## 2019-02-22 DIAGNOSIS — E559 Vitamin D deficiency, unspecified: Secondary | ICD-10-CM

## 2019-02-22 DIAGNOSIS — E7800 Pure hypercholesterolemia, unspecified: Secondary | ICD-10-CM

## 2019-02-27 ENCOUNTER — Other Ambulatory Visit: Payer: Self-pay

## 2019-02-27 ENCOUNTER — Ambulatory Visit (INDEPENDENT_AMBULATORY_CARE_PROVIDER_SITE_OTHER): Payer: BLUE CROSS/BLUE SHIELD | Admitting: Family Medicine

## 2019-02-27 DIAGNOSIS — E7800 Pure hypercholesterolemia, unspecified: Secondary | ICD-10-CM

## 2019-02-27 DIAGNOSIS — I10 Essential (primary) hypertension: Secondary | ICD-10-CM

## 2019-02-27 DIAGNOSIS — Z0000 Encounter for general adult medical examination without abnormal findings: Secondary | ICD-10-CM

## 2019-02-27 DIAGNOSIS — E559 Vitamin D deficiency, unspecified: Secondary | ICD-10-CM

## 2019-02-27 DIAGNOSIS — Z131 Encounter for screening for diabetes mellitus: Secondary | ICD-10-CM

## 2019-02-28 LAB — CBC
Hematocrit: 39.1 % (ref 34.0–46.6)
Hemoglobin: 13.7 g/dL (ref 11.1–15.9)
MCH: 30.9 pg (ref 26.6–33.0)
MCHC: 35 g/dL (ref 31.5–35.7)
MCV: 88 fL (ref 79–97)
Platelets: 239 10*3/uL (ref 150–450)
RBC: 4.43 x10E6/uL (ref 3.77–5.28)
RDW: 13.7 % (ref 11.7–15.4)
WBC: 5.1 10*3/uL (ref 3.4–10.8)

## 2019-02-28 LAB — COMPREHENSIVE METABOLIC PANEL
ALT: 19 IU/L (ref 0–32)
AST: 21 IU/L (ref 0–40)
Albumin/Globulin Ratio: 2.1 (ref 1.2–2.2)
Albumin: 4.4 g/dL (ref 3.8–4.8)
Alkaline Phosphatase: 60 IU/L (ref 39–117)
BUN/Creatinine Ratio: 14 (ref 12–28)
BUN: 12 mg/dL (ref 8–27)
Bilirubin Total: 0.3 mg/dL (ref 0.0–1.2)
CO2: 24 mmol/L (ref 20–29)
Calcium: 9.1 mg/dL (ref 8.7–10.3)
Chloride: 102 mmol/L (ref 96–106)
Creatinine, Ser: 0.87 mg/dL (ref 0.57–1.00)
GFR calc Af Amer: 81 mL/min/{1.73_m2} (ref >59)
GFR calc non Af Amer: 71 mL/min/{1.73_m2} (ref >59)
Globulin, Total: 2.1 g/dL (ref 1.5–4.5)
Glucose: 107 mg/dL — ABNORMAL HIGH (ref 65–99)
Potassium: 4.1 mmol/L (ref 3.5–5.2)
Sodium: 141 mmol/L (ref 134–144)
Total Protein: 6.5 g/dL (ref 6.0–8.5)

## 2019-02-28 LAB — LIPID PANEL
Chol/HDL Ratio: 2.7 ratio (ref 0.0–4.4)
Cholesterol, Total: 157 mg/dL (ref 100–199)
HDL: 58 mg/dL (ref >39)
LDL Calculated: 91 mg/dL (ref 0–99)
Triglycerides: 39 mg/dL (ref 0–149)
VLDL Cholesterol Cal: 8 mg/dL (ref 5–40)

## 2019-02-28 LAB — VITAMIN D 25 HYDROXY (VIT D DEFICIENCY, FRACTURES): Vit D, 25-Hydroxy: 70.3 ng/mL (ref 30.0–100.0)

## 2019-02-28 LAB — HEMOGLOBIN A1C
Est. average glucose Bld gHb Est-mCnc: 105 mg/dL
Hgb A1c MFr Bld: 5.3 % (ref 4.8–5.6)

## 2019-03-01 ENCOUNTER — Telehealth (INDEPENDENT_AMBULATORY_CARE_PROVIDER_SITE_OTHER): Payer: BLUE CROSS/BLUE SHIELD | Admitting: Family Medicine

## 2019-03-01 ENCOUNTER — Other Ambulatory Visit: Payer: Self-pay

## 2019-03-01 VITALS — Wt 145.0 lb

## 2019-03-01 DIAGNOSIS — E7800 Pure hypercholesterolemia, unspecified: Secondary | ICD-10-CM | POA: Diagnosis not present

## 2019-03-01 DIAGNOSIS — Z0001 Encounter for general adult medical examination with abnormal findings: Secondary | ICD-10-CM | POA: Diagnosis not present

## 2019-03-01 DIAGNOSIS — J302 Other seasonal allergic rhinitis: Secondary | ICD-10-CM | POA: Diagnosis not present

## 2019-03-01 DIAGNOSIS — E559 Vitamin D deficiency, unspecified: Secondary | ICD-10-CM | POA: Diagnosis not present

## 2019-03-01 DIAGNOSIS — Z0000 Encounter for general adult medical examination without abnormal findings: Secondary | ICD-10-CM

## 2019-03-01 MED ORDER — HYDROXYZINE HCL 10 MG PO TABS: 10.0000 mg | ORAL_TABLET | ORAL | 3 refills | Status: DC | PRN

## 2019-03-01 MED ORDER — MONTELUKAST SODIUM 10 MG PO TABS: ORAL_TABLET | ORAL | 1 refills | Status: DC

## 2019-03-01 MED ORDER — LEVOCETIRIZINE DIHYDROCHLORIDE 5 MG PO TABS: 5.0000 mg | ORAL_TABLET | Freq: Every day | ORAL | 11 refills | Status: DC

## 2019-03-01 NOTE — Progress Notes (Signed)
CC- Annual physical- No other issues but allergies are acting up but is taking the otc med for the allergies which is helping. Other that no other issus

## 2019-03-01 NOTE — Patient Instructions (Signed)
° ° ° °  If you have lab work done today you will be contacted with your lab results within the next 2 weeks.  If you have not heard from us then please contact us. The fastest way to get your results is to register for My Chart. ° ° °IF you received an x-ray today, you will receive an invoice from [facility]. Please contact [facility] at [phone] with questions or concerns regarding your invoice.  ° °IF you received labwork today, you will receive an invoice from [facility]. Please contact [facility] at [phone] with questions or concerns regarding your invoice.  ° °Our billing staff will not be able to assist you with questions regarding bills from these companies. ° °You will be contacted with the lab results as soon as they are available. The fastest way to get your results is to activate your My Chart account. Instructions are located on the last page of this paperwork. If you have not heard from us regarding the results in 2 weeks, please contact this office. °  ° ° ° °

## 2019-03-01 NOTE — Progress Notes (Signed)
Telemedicine Encounter- SOAP NOTE Established Patient  This telephone encounter was conducted with the patient's (or proxy's) verbal consent via audio telecommunications: yes/no: Yes Patient was instructed to have this encounter in a suitably private space; and to only have persons present to whom they give permission to participate. In addition, patient identity was confirmed by use of name plus two identifiers (DOB and address).  I discussed the limitations, risks, security and privacy concerns of performing an evaluation and management service by telephone and the availability of in person appointments. I also discussed with the patient that there may be a patient responsible charge related to this service. The patient expressed understanding and agreed to proceed.  I spent a total of TIME; 0 MIN TO 60 MIN: 20 minutes talking with the patient or their proxy.  CC: Physical exam   Subjective   Tiffany Gonzales is a 63 y.o. established patient. Telephone visit today for  HPI  Health Maintenance Exam Mammogram up to date Dental: up to date Vision: up to date Exercise: 3 times a week   She reports that her blood pressure has been very good and she has been exercising to maintain her blood pressure   BP Readings from Last 3 Encounters:  09/17/18 135/81  08/30/18 125/80  01/31/18 100/64    Wt Readings from Last 3 Encounters:  09/17/18 145 lb 6.4 oz (66 kg)  08/30/18 146 lb 9.6 oz (66.5 kg)  01/31/18 149 lb 12.8 oz (67.9 kg)    Depression screen [facility] 2/9 03/01/2019 09/17/2018 08/30/2018 01/31/2018 11/24/2017  Decreased Interest 0 0 0 0 0  Down, Depressed, Hopeless 0 0 0 0 0  PHQ - 2 Score 0 0 0 0 0   Review of Systems  Constitutional: Negative for activity change, appetite change, chills and fever.  HENT: Negative for congestion, nosebleeds, trouble swallowing and voice change.  +itchy eyes, ears, and runny nose. No fevers or chills  Respiratory: Negative for cough,  shortness of breath and wheezing.   Gastrointestinal: Negative for diarrhea, nausea and vomiting.  Genitourinary: Negative for difficulty urinating, dysuria, flank pain and hematuria.  Musculoskeletal: Negative for back pain, joint swelling and neck pain.  Neurological: Negative for dizziness, speech difficulty, light-headedness and numbness.  See HPI. All other review of systems negative.    Patient Active Problem List   Diagnosis Date Noted  . Sialolithiasis 08/30/2018  . Breast abscess 04/08/2015  . Memory loss 12/09/2013  . History of insect sting allergy 11/18/2012  . Post-menopause atrophic vaginitis 09/03/2012  . Allergic reaction 06/05/2012  . OSA (obstructive sleep apnea) 11/08/2011  . Cervical disc disease 06/28/2011  . TOBACCO ABUSE 03/01/2010  . LEUKOPENIA, MILD 12/10/2008  . HYPERCHOLESTEROLEMIA 12/09/2008  . Essential hypertension 03/05/2008  . DEPRESSIVE DISORDER 11/13/2007    Past Medical History:  Diagnosis Date  . Allergy    seasonal  . Atypical chest pain 01/2007   negative stress test  . Colon polyps   . Depression   . Fibroids   . Hyperlipidemia   . Hypertension   . Panic attacks     Current Outpatient Medications  Medication Sig Dispense Refill  . aspirin 81 MG tablet Take 81 mg by mouth daily.      . Cholecalciferol 50 MCG (2000 UT) CAPS Take by mouth.    . hydrOXYzine (ATARAX/VISTARIL) 10 MG tablet Take 1 tablet (10 mg total) by mouth as needed. 30 tablet 3  . levocetirizine (XYZAL) 5 MG tablet Take 1 tablet (  5 mg total) by mouth daily. 30 tablet 11  . mometasone (ELOCON) 0.1 % cream Apply 1 application topically as needed.    . montelukast (SINGULAIR) 10 MG tablet TAKE 1 TABLET BY MOUTH AT BEDTIME. GENERIC EQUIVALENT FOR SINGULAIR 90 tablet 1  . simvastatin (ZOCOR) 20 MG tablet TAKE 1 TABLET BY MOUTH AT BEDTIME. NO MORE REFILLS WITHOUT AN OFFICE VISIT. 30 tablet 0   No current facility-administered medications for this visit.     Allergies   Allergen Reactions  . Iodinated Diagnostic Agents Shortness Of Breath  . Lisinopril Hives  . Shellfish Allergy     Social History   Socioeconomic History  . Marital status: Single    Spouse name: Not on file  . Number of children: 2  . Years of education: Not on file  . Highest education level: Not on file  Occupational History  . Occupation: Hydrologist: [employer]  . Financial resource strain: Not on file  . Food insecurity:    Worry: Not on file    Inability: Not on file  . Transportation needs:    Medical: Not on file    Non-medical: Not on file  Tobacco Use  . Smoking status: Current Every Day Smoker    Packs/day: 0.30    Years: 34.00    Pack years: 10.20    Types: Cigarettes  . Smokeless tobacco: Never Used  . Tobacco comment: 7 cigs daily 08/20/13  Substance and Sexual Activity  . Alcohol use: Yes    Alcohol/week: 1.0 standard drinks    Types: 1 Glasses of wine per week    Comment: white wine  . Drug use: No  . Sexual activity: Yes    Birth control/protection: Surgical  Lifestyle  . Physical activity:    Days per week: Not on file    Minutes per session: Not on file  . Stress: Not on file  Relationships  . Social connections:    Talks on phone: Not on file    Gets together: Not on file    Attends religious service: Not on file    Active member of club or organization: Not on file    Attends meetings of clubs or organizations: Not on file    Relationship status: Not on file  . Intimate partner violence:    Fear of current or ex partner: Not on file    Emotionally abused: Not on file    Physically abused: Not on file    Forced sexual activity: Not on file  Other Topics Concern  . 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  Lab  Results  Component Value Date   CHOL 157 02/27/2019   HDL 58 02/27/2019   LDLCALC 91 02/27/2019   LDLDIRECT 163.9 03/10/2009   TRIG 39 02/27/2019   CHOLHDL 2.7 02/27/2019   Lab Results  Component Value Date   HGBA1C 5.3 02/27/2019     Diagnoses and all orders for this visit:  Annual physical exam - discussed age appropriate screenings   Other seasonal allergic rhinitis-  Discussed allergy treatment -     montelukast (SINGULAIR) 10 MG tablet; TAKE 1 TABLET BY MOUTH AT BEDTIME. GENERIC EQUIVALENT FOR SINGULAIR  Vitamin D deficiency-  Vitamin  D levels improved, cut down on supplement, also advised pt take MV  HYPERCHOLESTEROLEMIA- Discussed medications that affect lipids Reminded patient to avoid grapefruits Reviewed last 3 lipids Discussed current meds: statin, aspirin Advised dietary fiber and fish oil and ways to keep HDL high CAD prevention and reviewed side effects of statins Patient is a nonsmoker.  Smoking cessation discussed.   Other orders -     levocetirizine (XYZAL) 5 MG tablet; Take 1 tablet (5 mg total) by mouth daily. -     hydrOXYzine (ATARAX/VISTARIL) 10 MG tablet; Take 1 tablet (10 mg total) by mouth as needed.     I discussed the assessment and treatment plan with the patient. The patient was provided an opportunity to ask questions and all were answered. The patient agreed with the plan and demonstrated an understanding of the instructions.   The patient was advised to call back or seek an in-person evaluation if the symptoms worsen or if the condition fails to improve as anticipated.  I provided 20 minutes of non-face-to-face time during this encounter.  Forrest Moron, MD  Primary Care at [facility]

## 2019-03-05 ENCOUNTER — Other Ambulatory Visit: Payer: Self-pay | Admitting: Family Medicine

## 2019-03-13 ENCOUNTER — Emergency Department (HOSPITAL_BASED_OUTPATIENT_CLINIC_OR_DEPARTMENT_OTHER): Payer: Worker's Compensation

## 2019-03-13 ENCOUNTER — Encounter (HOSPITAL_BASED_OUTPATIENT_CLINIC_OR_DEPARTMENT_OTHER): Payer: Self-pay | Admitting: Emergency Medicine

## 2019-03-13 ENCOUNTER — Emergency Department (HOSPITAL_BASED_OUTPATIENT_CLINIC_OR_DEPARTMENT_OTHER)
Admission: EM | Admit: 2019-03-13 | Discharge: 2019-03-13 | Disposition: A | Discharge status: Home / Self Care | Payer: Worker's Compensation | Attending: Emergency Medicine | Admitting: Emergency Medicine

## 2019-03-13 ENCOUNTER — Other Ambulatory Visit: Payer: Self-pay

## 2019-03-13 DIAGNOSIS — S50312A Abrasion of left elbow, initial encounter: Secondary | ICD-10-CM | POA: Diagnosis not present

## 2019-03-13 DIAGNOSIS — Y999 Unspecified external cause status: Secondary | ICD-10-CM | POA: Diagnosis not present

## 2019-03-13 DIAGNOSIS — Y9289 Other specified places as the place of occurrence of the external cause: Secondary | ICD-10-CM | POA: Insufficient documentation

## 2019-03-13 DIAGNOSIS — M542 Cervicalgia: Secondary | ICD-10-CM | POA: Diagnosis not present

## 2019-03-13 DIAGNOSIS — S0990XA Unspecified injury of head, initial encounter: Secondary | ICD-10-CM | POA: Diagnosis present

## 2019-03-13 DIAGNOSIS — I10 Essential (primary) hypertension: Secondary | ICD-10-CM | POA: Diagnosis not present

## 2019-03-13 DIAGNOSIS — S80212A Abrasion, left knee, initial encounter: Secondary | ICD-10-CM | POA: Diagnosis not present

## 2019-03-13 DIAGNOSIS — Y9389 Activity, other specified: Secondary | ICD-10-CM | POA: Insufficient documentation

## 2019-03-13 DIAGNOSIS — S0003XA Contusion of scalp, initial encounter: Secondary | ICD-10-CM | POA: Diagnosis not present

## 2019-03-13 DIAGNOSIS — R110 Nausea: Secondary | ICD-10-CM | POA: Diagnosis not present

## 2019-03-13 DIAGNOSIS — R630 Anorexia: Secondary | ICD-10-CM | POA: Diagnosis not present

## 2019-03-13 DIAGNOSIS — F17210 Nicotine dependence, cigarettes, uncomplicated: Secondary | ICD-10-CM | POA: Diagnosis not present
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
## 2019-03-13 NOTE — Discharge Instructions (Addendum)
You were evaluated in the Emergency Department and after careful evaluation, we did not find any emergent condition requiring admission or further testing in the hospital.  Your CT scans today were reassuring.  No broken bones, no bleeding, no emergencies.  You may have sustained a mild concussion.  Please practice mental and physical rest until you are feeling better.  Use Tylenol or ibuprofen during the day for discomfort.  Please return to the Emergency Department if you experience any worsening of your condition.  We encourage you to follow up with a primary care provider.  Thank you for allowing [place] to be a part of your care.

## 2019-03-13 NOTE — ED Notes (Signed)
ED Provider at bedside. 
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
## 2019-04-02 ENCOUNTER — Other Ambulatory Visit: Payer: Self-pay | Admitting: Family Medicine

## 2019-04-02 DIAGNOSIS — J302 Other seasonal allergic rhinitis: Secondary | ICD-10-CM

## 2019-04-02 NOTE — Telephone Encounter (Signed)
2nd attempt to reach patient. Left VM to return call to office.  Pt returned call stating that she does not need refill at this point.  She will be changing jobs and will call back for refill at that time.

## 2019-04-02 NOTE — Telephone Encounter (Signed)
Called pt to clarify which pharmacy she would like to use for her singular medication. Left VM to return call to office.

## 2019-05-26 ENCOUNTER — Other Ambulatory Visit: Payer: Self-pay | Admitting: Family Medicine

## 2019-05-26 NOTE — Telephone Encounter (Signed)
Requested Prescriptions  Pending Prescriptions Disp Refills  . simvastatin (ZOCOR) 20 MG tablet [Pharmacy Med Name: SIMVASTATIN 20MG  TABLETS] 90 tablet 0    Sig: Take 1 tablet (20 mg total) by mouth daily at 6 PM.     Cardiovascular:  Antilipid - Statins Failed - 05/26/2019  9:06 AM      Failed - Valid encounter within last 12 months    Recent Outpatient Visits          2 months ago Annual physical exam   [facility], Lilia Argue, MD   8 months ago Sprain of anterior talofibular ligament of left ankle, initial encounter   Primary Care at [facility], [place] D, PA-C   8 months ago Sialolithiasis   Primary Care at [facility], [place], MD   1 year ago Health maintenance examination   Primary Care at [facility], [place], MD   1 year ago Essential hypertension   Primary Care at [facility], Arlie Solomons, MD             Passed - Total Cholesterol in normal range and within 360 days    Cholesterol, Total  Date Value Ref Range Status  02/27/2019 157 100 - 199 mg/dL Final         Passed - LDL in normal range and within 360 days    LDL Calculated  Date Value Ref Range Status  02/27/2019 91 0 - 99 mg/dL Final         Passed - HDL in normal range and within 360 days    HDL  Date Value Ref Range Status  02/27/2019 58 >39 mg/dL Final         Passed - Triglycerides in normal range and within 360 days    Triglycerides  Date Value Ref Range Status  02/27/2019 39 0 - 149 mg/dL Final         Passed - Patient is not pregnant

## 2019-06-28 ENCOUNTER — Other Ambulatory Visit: Payer: Self-pay

## 2019-06-28 DIAGNOSIS — Z20822 Contact with and (suspected) exposure to covid-19: Secondary | ICD-10-CM

## 2019-06-29 LAB — NOVEL CORONAVIRUS, NAA: SARS-CoV-2, NAA: NOT DETECTED

## 2019-07-23 DIAGNOSIS — H524 Presbyopia: Secondary | ICD-10-CM | POA: Diagnosis not present

## 2019-07-24 DIAGNOSIS — Z1231 Encounter for screening mammogram for malignant neoplasm of breast: Secondary | ICD-10-CM | POA: Diagnosis not present

## 2019-07-24 LAB — HM MAMMOGRAPHY

## 2019-07-31 ENCOUNTER — Encounter: Payer: Self-pay | Admitting: Family Medicine

## 2019-08-05 ENCOUNTER — Telehealth: Payer: BLUE CROSS/BLUE SHIELD | Admitting: Family Medicine

## 2019-08-27 ENCOUNTER — Telehealth: Payer: Self-pay | Admitting: Family Medicine

## 2019-08-27 DIAGNOSIS — G4733 Obstructive sleep apnea (adult) (pediatric): Secondary | ICD-10-CM | POA: Diagnosis not present

## 2019-08-27 NOTE — Telephone Encounter (Signed)
Please advise 

## 2019-08-27 NOTE — Telephone Encounter (Signed)
Copied from [place] [id number]. Topic: General - Other >> Aug 26, 2019 12:28 PM Celene Kras A wrote: Reason for CRM: Wilfred Lacy, from [place], called regarding a fax sent over on 08/16/2019 regarding pts medications. He is requesting a call back to speak with PCP. Please advise.   [phone]

## 2019-08-29 ENCOUNTER — Other Ambulatory Visit: Payer: Self-pay | Admitting: Family Medicine

## 2019-08-29 DIAGNOSIS — J302 Other seasonal allergic rhinitis: Secondary | ICD-10-CM

## 2019-08-29 MED ORDER — LEVOCETIRIZINE DIHYDROCHLORIDE 5 MG PO TABS: 5.0000 mg | ORAL_TABLET | Freq: Every day | ORAL | 11 refills | Status: DC

## 2019-08-29 MED ORDER — MONTELUKAST SODIUM 10 MG PO TABS: ORAL_TABLET | ORAL | 1 refills | Status: DC

## 2019-08-29 MED ORDER — SIMVASTATIN 20 MG PO TABS: 20.0000 mg | ORAL_TABLET | Freq: Every day | ORAL | 0 refills | Status: DC

## 2019-08-29 MED ORDER — HYDROXYZINE HCL 10 MG PO TABS: 10.0000 mg | ORAL_TABLET | ORAL | 3 refills | Status: DC | PRN

## 2019-08-29 NOTE — Telephone Encounter (Signed)
Medication Refill - Medication: hydrOXYzine (ATARAX/VISTARIL) 10 MG tablet  levocetirizine (XYZAL) 5 MG tablet  montelukast (SINGULAIR) 10 MG tablet simvastatin (ZOCOR) 20 MG tablet    Has the patient contacted their pharmacy? Yes.   (Agent: If no, request that the patient contact the pharmacy for the refill.) (Agent: If yes, when and what did the pharmacy advise?)  Preferred Pharmacy (with phone number or street name): [facility], [address]  Agent: Please be advised that RX refills may take up to 3 business days. We ask that you follow-up with your pharmacy.

## 2019-09-03 MED ORDER — HYDROXYZINE HCL 10 MG PO TABS: 10.0000 mg | ORAL_TABLET | Freq: Every day | ORAL | 3 refills | Status: DC | PRN

## 2019-09-03 NOTE — Telephone Encounter (Signed)
Medication called in and changed to daily PRN

## 2019-09-09 DIAGNOSIS — H52209 Unspecified astigmatism, unspecified eye: Secondary | ICD-10-CM | POA: Diagnosis not present

## 2019-09-09 DIAGNOSIS — H524 Presbyopia: Secondary | ICD-10-CM | POA: Diagnosis not present

## 2019-09-09 DIAGNOSIS — H5213 Myopia, bilateral: Secondary | ICD-10-CM | POA: Diagnosis not present

## 2019-11-19 ENCOUNTER — Other Ambulatory Visit: Payer: Self-pay | Admitting: Family Medicine

## 2019-11-19 MED ORDER — SIMVASTATIN 20 MG PO TABS: 20.0000 mg | ORAL_TABLET | Freq: Every day | ORAL | 0 refills | Status: DC

## 2019-11-19 NOTE — Telephone Encounter (Signed)
Medication Refill - Medication: simvastatin (ZOCOR) 20 MG tablet [id number]    Preferred Pharmacy (with phone number or street name):  [facility], [place]  [place] [address]  Phone: [phone] Fax: [phone]    Agent: Please be advised that RX refills may take up to 3 business days. We ask that you follow-up with your pharmacy.

## 2020-01-11 ENCOUNTER — Ambulatory Visit: Payer: BLUE CROSS/BLUE SHIELD

## 2020-01-16 ENCOUNTER — Ambulatory Visit: Payer: Medicare HMO

## 2020-01-19 ENCOUNTER — Ambulatory Visit: Payer: Medicare HMO | Attending: Internal Medicine

## 2020-01-19 DIAGNOSIS — Z23 Encounter for immunization: Secondary | ICD-10-CM | POA: Insufficient documentation

## 2020-01-19 NOTE — Progress Notes (Signed)
   Covid-19 Vaccination Clinic  Name:  Tiffany Gonzales [facility]    MRN: [id number] DOB: 09/10/1956  01/19/2020  Tiffany Gonzales was observed post Covid-19 immunization for 30 minutes based on pre-vaccination screening without incidence. She was provided with Vaccine Information Sheet and instruction to access the V-Safe system.   Tiffany Gonzales was instructed to call 911 with any severe reactions post vaccine: Marland Kitchen Difficulty breathing  . Swelling of your face and throat  . A fast heartbeat  . A bad rash all over your body  . Dizziness and weakness    Immunizations Administered    Name Date Dose VIS Date Route   Pfizer COVID-19 Vaccine 01/19/2020  1:51 PM 0.3 mL 11/01/2019 Intramuscular   Manufacturer: [place]   Lot: [id number]   [place]: [id number]

## 2020-02-17 ENCOUNTER — Ambulatory Visit: Payer: Medicare HMO | Attending: Internal Medicine

## 2020-02-17 ENCOUNTER — Ambulatory Visit: Payer: Self-pay

## 2020-02-17 DIAGNOSIS — Z23 Encounter for immunization: Secondary | ICD-10-CM

## 2020-02-17 NOTE — Progress Notes (Signed)
   Covid-19 Vaccination Clinic  Name:  Tiffany Gonzales [name]    MRN: [id number] DOB: 05/13/1956  02/17/2020  Tiffany Gonzales was observed post Covid-19 immunization for 30 minutes based on pre-vaccination screening without incident. She was provided with Vaccine Information Sheet and instruction to access the V-Safe system.   Tiffany Gonzales was instructed to call 911 with any severe reactions post vaccine: Marland Kitchen Difficulty breathing  . Swelling of face and throat  . A fast heartbeat  . A bad rash all over body  . Dizziness and weakness   Immunizations Administered    Name Date Dose VIS Date Route   Pfizer COVID-19 Vaccine 02/17/2020  8:19 AM 0.3 mL 11/01/2019 Intramuscular   Manufacturer: [place]   Lot: [id number]   [place]: [id number]

## 2020-02-20 ENCOUNTER — Other Ambulatory Visit: Payer: Self-pay | Admitting: Family Medicine

## 2020-02-20 DIAGNOSIS — J302 Other seasonal allergic rhinitis: Secondary | ICD-10-CM

## 2020-02-20 MED ORDER — SIMVASTATIN 20 MG PO TABS: 20.0000 mg | ORAL_TABLET | Freq: Every day | ORAL | 0 refills | Status: DC

## 2020-02-20 MED ORDER — MONTELUKAST SODIUM 10 MG PO TABS: ORAL_TABLET | ORAL | 1 refills | Status: DC

## 2020-02-20 NOTE — Telephone Encounter (Signed)
Medication refill statin (ZOCOR) 20 MG tablet [id number] montelukast (SINGULAIR) 10 MG tablet [id number]   Pharmacy  [facility] - [place], [place] Phone:  [phone]  Fax:  [phone]        Pt aware of turn around time

## 2020-02-20 NOTE — Telephone Encounter (Signed)
Requested Prescriptions  Pending Prescriptions Disp Refills  . montelukast (SINGULAIR) 10 MG tablet 90 tablet 1    Sig: TAKE 1 TABLET BY MOUTH AT BEDTIME. GENERIC EQUIVALENT FOR SINGULAIR     Pulmonology:  Leukotriene Inhibitors Passed - 02/20/2020  9:44 AM      Passed - Valid encounter within last 12 months    Recent Outpatient Visits          11 months ago Annual physical exam   Primary Care at [facility], Arlie Solomons, MD   11 months ago Annual physical exam   [facility] at [facility], Lilia Argue, MD   1 year ago Sprain of anterior talofibular ligament of left ankle, initial encounter   Primary Care at [facility], [place] D, PA-C   1 year ago Sialolithiasis   Primary Care at [facility], [place], MD   2 years ago Health maintenance examination   Primary Care at [facility], [place] A, MD             . simvastatin (ZOCOR) 20 MG tablet 90 tablet 0    Sig: Take 1 tablet (20 mg total) by mouth daily at 6 PM.     Cardiovascular:  Antilipid - Statins Passed - 02/20/2020  9:44 AM      Passed - Total Cholesterol in normal range and within 360 days    Cholesterol, Total  Date Value Ref Range Status  02/27/2019 157 100 - 199 mg/dL Final         Passed - LDL in normal range and within 360 days    LDL Calculated  Date Value Ref Range Status  02/27/2019 91 0 - 99 mg/dL Final   Direct LDL  Date Value Ref Range Status  03/10/2009 163.9 mg/dL Final    Comment:    See lab report for associated comment(s)         Passed - HDL in normal range and within 360 days    HDL  Date Value Ref Range Status  02/27/2019 58 >39 mg/dL Final         Passed - Triglycerides in normal range and within 360 days    Triglycerides  Date Value Ref Range Status  02/27/2019 39 0 - 149 mg/dL Final         Passed - Patient is not pregnant      Passed - Valid encounter within last 12 months    Recent Outpatient Visits          11 months ago Annual physical exam   Primary Care at  [facility], Arlie Solomons, MD   11 months ago Annual physical exam   [facility] at [facility], Lilia Argue, MD   1 year ago Sprain of anterior talofibular ligament of left ankle, initial encounter   Primary Care at [facility], [place] D, PA-C   1 year ago Sialolithiasis   Primary Care at [facility], Ines Bloomer, MD   2 years ago Health maintenance examination   Primary Care at [facility], Arlie Solomons, MD

## 2020-03-13 ENCOUNTER — Encounter: Payer: Self-pay | Admitting: Registered Nurse

## 2020-03-13 ENCOUNTER — Other Ambulatory Visit: Payer: Self-pay

## 2020-03-13 ENCOUNTER — Ambulatory Visit (INDEPENDENT_AMBULATORY_CARE_PROVIDER_SITE_OTHER): Payer: Medicare HMO

## 2020-03-13 ENCOUNTER — Ambulatory Visit (INDEPENDENT_AMBULATORY_CARE_PROVIDER_SITE_OTHER): Payer: Medicare HMO | Admitting: Registered Nurse

## 2020-03-13 VITALS — BP 169/109 | HR 83 | Temp 98.0°F | Resp 16 | Ht 64.0 in | Wt 146.0 lb

## 2020-03-13 DIAGNOSIS — Z13228 Encounter for screening for other metabolic disorders: Secondary | ICD-10-CM

## 2020-03-13 DIAGNOSIS — M25521 Pain in right elbow: Secondary | ICD-10-CM | POA: Diagnosis not present

## 2020-03-13 DIAGNOSIS — M25552 Pain in left hip: Secondary | ICD-10-CM

## 2020-03-13 DIAGNOSIS — Z130 Encounter for screening for diseases of the blood and blood-forming organs and certain disorders involving the immune mechanism: Secondary | ICD-10-CM

## 2020-03-13 DIAGNOSIS — M160 Bilateral primary osteoarthritis of hip: Secondary | ICD-10-CM | POA: Diagnosis not present

## 2020-03-13 DIAGNOSIS — M25571 Pain in right ankle and joints of right foot: Secondary | ICD-10-CM

## 2020-03-13 DIAGNOSIS — J302 Other seasonal allergic rhinitis: Secondary | ICD-10-CM

## 2020-03-13 DIAGNOSIS — M722 Plantar fascial fibromatosis: Secondary | ICD-10-CM

## 2020-03-13 DIAGNOSIS — M1612 Unilateral primary osteoarthritis, left hip: Secondary | ICD-10-CM | POA: Diagnosis not present

## 2020-03-13 DIAGNOSIS — M25562 Pain in left knee: Secondary | ICD-10-CM

## 2020-03-13 DIAGNOSIS — Z13220 Encounter for screening for lipoid disorders: Secondary | ICD-10-CM

## 2020-03-13 DIAGNOSIS — Z1329 Encounter for screening for other suspected endocrine disorder: Secondary | ICD-10-CM

## 2020-03-13 MED ORDER — TRAMADOL HCL 50 MG PO TABS: 50.0000 mg | ORAL_TABLET | Freq: Every evening | ORAL | 0 refills | Status: DC | PRN

## 2020-03-13 MED ORDER — LEVOCETIRIZINE DIHYDROCHLORIDE 5 MG PO TABS: 5.0000 mg | ORAL_TABLET | Freq: Every day | ORAL | 11 refills | Status: DC

## 2020-03-13 MED ORDER — TRAMADOL HCL 50 MG PO TABS: 50.0000 mg | ORAL_TABLET | Freq: Three times a day (TID) | ORAL | 0 refills | Status: AC | PRN

## 2020-03-13 NOTE — Patient Instructions (Signed)
° ° ° °  If you have lab work done today you will be contacted with your lab results within the next 2 weeks.  If you have not heard from us then please contact us. The fastest way to get your results is to register for My Chart. ° ° °IF you received an x-ray today, you will receive an invoice from [facility]. Please contact [facility] at [phone] with questions or concerns regarding your invoice.  ° °IF you received labwork today, you will receive an invoice from [facility]. Please contact [facility] at [phone] with questions or concerns regarding your invoice.  ° °Our billing staff will not be able to assist you with questions regarding bills from these companies. ° °You will be contacted with the lab results as soon as they are available. The fastest way to get your results is to activate your My Chart account. Instructions are located on the last page of this paperwork. If you have not heard from us regarding the results in 2 weeks, please contact this office. °  ° ° ° °

## 2020-03-13 NOTE — Progress Notes (Signed)
Established Patient Office Visit  Subjective:  Patient ID: Tiffany Gonzales, female    DOB: May 26, 1956  Age: 64 y.o. MRN: [id number]  CC:  Chief Complaint  Patient presents with  . Pain    Patient states patient is left leg and and right foot pain for a month after starting a new job. Per patient she has took tylenol and even brought expensive shoes    HPI Tiffany Gonzales presents for new pain in her R foot and ankle, L knee, L hip, and R elbow.   These have all been going on for around 1 mo. Has started a new job that requires her to be on her feet for long periods of time. Has bought supportive shoes and inserts and taken tylenol - some relief, but not much. No numbness, weakness, tingling, radiculopathy. Denies acute injury. No bruising. No change to ROM  Past Medical History:  Diagnosis Date  . Allergy    seasonal  . Atypical chest pain 01/2007   negative stress test  . Colon polyps   . Depression   . Fibroids   . Hyperlipidemia   . Hypertension   . Panic attacks     Past Surgical History:  Procedure Laterality Date  . ABDOMINAL HYSTERECTOMY  1998  . CATARACT EXTRACTION    . NASAL SINUS SURGERY  2007   Dr Mikey Bussing out area to help breath    Family History  Problem Relation Age of Onset  . Arthritis Mother   . Hypertension Father   . Heart disease Maternal Grandmother   . Heart disease Paternal Grandmother     Social History   Socioeconomic History  . Marital status: Single    Spouse name: Not on file  . Number of children: 2  . Years of education: Not on file  . Highest education level: Not on file  Occupational History  . Occupation: Hydrologist: [employer]  Tobacco Use  . Smoking status: Current Every Day Smoker    Packs/day: 0.30    Years: 34.00    Pack years: 10.20    Types: Cigarettes  . Smokeless tobacco: Never Used  . Tobacco comment: 7 cigs daily 08/20/13  Substance and Sexual Activity  . Alcohol  use: Yes    Alcohol/week: 1.0 standard drinks    Types: 1 Glasses of wine per week    Comment: white wine  . Drug use: No  . Sexual activity: Yes    Birth control/protection: Surgical  Other Topics Concern  . 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 Difficulty of Paying Living Expenses:   Food Insecurity:   . Worried About Charity fundraiser in the Last Year:   . Arboriculturist in the Last Year:   Transportation Needs:   . Film/video editor (Medical):   Marland Kitchen Lack of Transportation (Non-Medical):   Physical Activity:   . Days of Exercise per Week:   . Minutes of Exercise per Session:   Stress:   . Feeling of Stress :   Social Connections:   . Frequency of Communication with Friends and Family:   . Frequency of Social Gatherings with Friends and Family:   . Attends Religious Services:   . Active Member of Clubs or Organizations:   . Attends  Club or Organization Meetings:   Marland Kitchen Marital Status:   Intimate Partner Violence:   . Fear of Current or Ex-Partner:   . Emotionally Abused:   Marland Kitchen Physically Abused:   . Sexually Abused:     Outpatient Medications Prior to Visit  Medication Sig Dispense Refill  . aspirin 81 MG tablet Take 81 mg by mouth daily.      . Cholecalciferol 50 MCG (2000 UT) CAPS Take by mouth.    . hydrOXYzine (ATARAX/VISTARIL) 10 MG tablet Take 1 tablet (10 mg total) by mouth daily as needed. 90 tablet 3  . montelukast (SINGULAIR) 10 MG tablet TAKE 1 TABLET BY MOUTH AT BEDTIME. GENERIC EQUIVALENT FOR SINGULAIR 90 tablet 1  . simvastatin (ZOCOR) 20 MG tablet Take 1 tablet (20 mg total) by mouth daily at 6 PM. 90 tablet 0  . levocetirizine (XYZAL) 5 MG tablet Take 1 tablet (5 mg total) by mouth daily. 30 tablet 11   No facility-administered medications prior to visit.    Allergies  Allergen Reactions  .  Iodinated Diagnostic Agents Shortness Of Breath  . Lisinopril Hives  . Shellfish Allergy     ROS Review of Systems  Constitutional: Negative.   HENT: Negative.   Eyes: Negative.   Respiratory: Negative.   Cardiovascular: Negative.   Gastrointestinal: Negative.   Endocrine: Negative.   Genitourinary: Negative.   Musculoskeletal: Positive for arthralgias (per hpi ). Negative for back pain, gait problem, joint swelling, myalgias, neck pain and neck stiffness.  Skin: Negative.   Allergic/Immunologic: Negative.   Neurological: Negative.   Hematological: Negative.   Psychiatric/Behavioral: Negative.   All other systems reviewed and are negative.     Objective:    Physical Exam  Constitutional: She appears well-developed and well-nourished. No distress.  Cardiovascular: Normal rate and regular rhythm.  Pulmonary/Chest: Effort normal. No respiratory distress.  Musculoskeletal:        General: Tenderness present. No deformity or edema. Normal range of motion.     Comments: Positive polks test in R elbow TTP at sole of R foot, mild tenderness along achilles. Achilles intact with thompson test. Negative anterior and posterior drawer in L knee. Negative mcmurray in L knee. Pt was able to reproduce pain in knee with walking up stairs. ROM in hip full with only mild discomfort.  Skin: Skin is warm and dry. No rash noted. She is not diaphoretic. No erythema. No pallor.  Psychiatric: She has a normal mood and affect. Her behavior is normal. Judgment and thought content normal.  Nursing note and vitals reviewed.   BP (!) 169/109   Pulse 83   Temp 98 F (36.7 C) (Temporal)   Resp 16   Ht 5\' 4"  (1.626 m)   Wt 146 lb (66.2 kg)   SpO2 97%   BMI 25.06 kg/m  Wt Readings from Last 3 Encounters:  03/13/20 146 lb (66.2 kg)  03/13/19 147 lb (66.7 kg)  03/01/19 145 lb (65.8 kg)     There are no preventive care reminders to display for this patient.  There are no preventive care  reminders to display for this patient.  Lab Results  Component Value Date   TSH 1.723 12/09/2013   Lab Results  Component Value Date   WBC 5.1 02/27/2019   HGB 13.7 02/27/2019   HCT 39.1 02/27/2019   MCV 88 02/27/2019   PLT 239 02/27/2019   Lab Results  Component Value Date   NA 141 02/27/2019   K 4.1 02/27/2019  CO2 24 02/27/2019   GLUCOSE 107 (H) 02/27/2019   BUN 12 02/27/2019   CREATININE 0.87 02/27/2019   BILITOT 0.3 02/27/2019   ALKPHOS 60 02/27/2019   AST 21 02/27/2019   ALT 19 02/27/2019   PROT 6.5 02/27/2019   ALBUMIN 4.4 02/27/2019   CALCIUM 9.1 02/27/2019   ANIONGAP 12 05/30/2014   Lab Results  Component Value Date   CHOL 157 02/27/2019   Lab Results  Component Value Date   HDL 58 02/27/2019   Lab Results  Component Value Date   LDLCALC 91 02/27/2019   Lab Results  Component Value Date   TRIG 39 02/27/2019   Lab Results  Component Value Date   CHOLHDL 2.7 02/27/2019   Lab Results  Component Value Date   HGBA1C 5.3 02/27/2019      Assessment & Plan:   Problem List Items Addressed This Visit    None    Visit Diagnoses    Pain in joint involving right ankle and foot    -  Primary   Relevant Medications   traMADol (ULTRAM) 50 MG tablet   Other Relevant Orders   Sedimentation Rate   C-reactive protein   Right elbow pain       Relevant Medications   traMADol (ULTRAM) 50 MG tablet   Other Relevant Orders   Sedimentation Rate   C-reactive protein   Left hip pain       Relevant Medications   traMADol (ULTRAM) 50 MG tablet   Other Relevant Orders   Sedimentation Rate   C-reactive protein   DG Hip Unilat W OR W/O Pelvis 2-3 Views Left (Completed)   Acute pain of left knee       Relevant Medications   traMADol (ULTRAM) 50 MG tablet   Other Relevant Orders   Sedimentation Rate   C-reactive protein   Screening for endocrine, metabolic and immunity disorder       Relevant Orders   TSH   CBC with Differential   Comprehensive  metabolic panel   Hemoglobin A1c   Lipid screening       Relevant Orders   Lipid Panel   Other seasonal allergic rhinitis       Relevant Medications   levocetirizine (XYZAL) 5 MG tablet      Meds ordered this encounter  Medications  . levocetirizine (XYZAL) 5 MG tablet    Sig: Take 1 tablet (5 mg total) by mouth daily.    Dispense:  30 tablet    Refill:  11  . traMADol (ULTRAM) 50 MG tablet    Sig: Take 1 tablet (50 mg total) by mouth at bedtime as needed for up to 20 days.    Dispense:  20 tablet    Refill:  0    Order Specific Question:   Supervising Provider    Answer:   Forrest Moron [id number]    Follow-up: No follow-ups on file.   PLAN  Plantar fasciitis  Tennis elbow  Mild degenerative changes of hip on Xray  patello femoral knee pain  Discussed that these will be best relieved with conservative measures, OTC analgesics, and RICE method  Pt understands  Tramadol qhs PRN for breakthrough pain  Patient encouraged to call clinic with any questions, comments, or concerns.  Maximiano Coss, NP

## 2020-03-14 ENCOUNTER — Encounter: Payer: Self-pay | Admitting: Registered Nurse

## 2020-03-14 LAB — COMPREHENSIVE METABOLIC PANEL
ALT: 21 IU/L (ref 0–32)
AST: 22 IU/L (ref 0–40)
Albumin/Globulin Ratio: 2.2 (ref 1.2–2.2)
Albumin: 4.4 g/dL (ref 3.8–4.8)
Alkaline Phosphatase: 59 IU/L (ref 39–117)
BUN/Creatinine Ratio: 17 (ref 12–28)
BUN: 14 mg/dL (ref 8–27)
Bilirubin Total: 0.3 mg/dL (ref 0.0–1.2)
CO2: 26 mmol/L (ref 20–29)
Calcium: 9.9 mg/dL (ref 8.7–10.3)
Chloride: 103 mmol/L (ref 96–106)
Creatinine, Ser: 0.82 mg/dL (ref 0.57–1.00)
GFR calc Af Amer: 87 mL/min/{1.73_m2} (ref >59)
GFR calc non Af Amer: 75 mL/min/{1.73_m2} (ref >59)
Globulin, Total: 2 g/dL (ref 1.5–4.5)
Glucose: 93 mg/dL (ref 65–99)
Potassium: 4.5 mmol/L (ref 3.5–5.2)
Sodium: 143 mmol/L (ref 134–144)
Total Protein: 6.4 g/dL (ref 6.0–8.5)

## 2020-03-14 LAB — SEDIMENTATION RATE: Sed Rate: 2 mm/hr (ref 0–40)

## 2020-03-14 LAB — CBC WITH DIFFERENTIAL/PLATELET
Basophils Absolute: 0 10*3/uL (ref 0.0–0.2)
Basos: 1 %
EOS (ABSOLUTE): 0.1 10*3/uL (ref 0.0–0.4)
Eos: 2 %
Hematocrit: 39.4 % (ref 34.0–46.6)
Hemoglobin: 13.7 g/dL (ref 11.1–15.9)
Immature Grans (Abs): 0 10*3/uL (ref 0.0–0.1)
Immature Granulocytes: 0 %
Lymphocytes Absolute: 1.8 10*3/uL (ref 0.7–3.1)
Lymphs: 49 %
MCH: 31.1 pg (ref 26.6–33.0)
MCHC: 34.8 g/dL (ref 31.5–35.7)
MCV: 89 fL (ref 79–97)
Monocytes Absolute: 0.3 10*3/uL (ref 0.1–0.9)
Monocytes: 8 %
Neutrophils Absolute: 1.4 10*3/uL (ref 1.4–7.0)
Neutrophils: 40 %
Platelets: 229 10*3/uL (ref 150–450)
RBC: 4.41 x10E6/uL (ref 3.77–5.28)
RDW: 14.3 % (ref 11.7–15.4)
WBC: 3.6 10*3/uL (ref 3.4–10.8)

## 2020-03-14 LAB — LIPID PANEL
Chol/HDL Ratio: 2.7 ratio (ref 0.0–4.4)
Cholesterol, Total: 182 mg/dL (ref 100–199)
HDL: 68 mg/dL (ref >39)
LDL Chol Calc (NIH): 106 mg/dL — ABNORMAL HIGH (ref 0–99)
Triglycerides: 41 mg/dL (ref 0–149)
VLDL Cholesterol Cal: 8 mg/dL (ref 5–40)

## 2020-03-14 LAB — HEMOGLOBIN A1C
Est. average glucose Bld gHb Est-mCnc: 114 mg/dL
Hgb A1c MFr Bld: 5.6 % (ref 4.8–5.6)

## 2020-03-14 LAB — TSH: TSH: 1.34 u[IU]/mL (ref 0.450–4.500)

## 2020-03-14 LAB — C-REACTIVE PROTEIN: CRP: <1 mg/L (ref 0–10)

## 2020-05-11 ENCOUNTER — Encounter: Payer: Self-pay | Admitting: Gastroenterology

## 2020-05-15 ENCOUNTER — Other Ambulatory Visit: Payer: Self-pay

## 2020-05-15 ENCOUNTER — Ambulatory Visit (INDEPENDENT_AMBULATORY_CARE_PROVIDER_SITE_OTHER): Payer: Medicare HMO | Admitting: Family Medicine

## 2020-05-15 ENCOUNTER — Encounter: Payer: Self-pay | Admitting: Family Medicine

## 2020-05-15 VITALS — BP 118/75 | HR 80 | Temp 98.3°F | Ht 64.0 in | Wt 146.0 lb

## 2020-05-15 DIAGNOSIS — L723 Sebaceous cyst: Secondary | ICD-10-CM | POA: Diagnosis not present

## 2020-05-15 DIAGNOSIS — L089 Local infection of the skin and subcutaneous tissue, unspecified: Secondary | ICD-10-CM

## 2020-05-15 MED ORDER — DOXYCYCLINE HYCLATE 100 MG PO TABS: 100.0000 mg | ORAL_TABLET | Freq: Two times a day (BID) | ORAL | 0 refills | Status: DC

## 2020-05-15 NOTE — Patient Instructions (Addendum)
Warm compresses with gentle pressure to express pus or sebaceous material afterwards.  Start antibiotic.  If any increased swelling, spreading redness or worsening symptoms return for recheck.  Area should continue to drain on its own with treatment above.   Epidermal Cyst  An epidermal cyst is a sac made of skin tissue. The sac contains a substance called keratin. Keratin is a protein that is normally secreted through the hair follicles. When keratin becomes trapped in the top layer of skin (epidermis), it can form an epidermal cyst. Epidermal cysts can be found anywhere on your body. These cysts are usually harmless (benign), and they may not cause symptoms unless they become infected. What are the causes? This condition may be caused by:  A blocked hair follicle.  A hair that curls and re-enters the skin instead of growing straight out of the skin (ingrown hair).  A blocked pore.  Irritated skin.  An injury to the skin.  Certain conditions that are passed along from parent to child (inherited).  Human papillomavirus (HPV).  Long-term (chronic) sun damage to the skin. What increases the risk? The following factors may make you more likely to develop an epidermal cyst:  Having acne.  Being overweight.  Being 72-55 years old. What are the signs or symptoms? The only symptom of this condition may be a small, painless lump underneath the skin. When an epidermal cyst ruptures, it may become infected. Symptoms may include:  Redness.  Inflammation.  Tenderness.  Warmth.  Fever.  Keratin draining from the cyst. Keratin is grayish-white, bad-smelling substance.  Pus draining from the cyst. How is this diagnosed? This condition is diagnosed with a physical exam.  In some cases, you may have a sample of tissue (biopsy) taken from your cyst to be examined under a microscope or tested for bacteria.  You may be referred to a health care provider who specializes in skin  care (dermatologist). How is this treated? In many cases, epidermal cysts go away on their own without treatment. If a cyst becomes infected, treatment may include:  Opening and draining the cyst, done by a health care provider. After draining, minor surgery to remove the rest of the cyst may be done.  Antibiotic medicine.  Injections of medicines (steroids) that help to reduce inflammation.  Surgery to remove the cyst. Surgery may be done if the cyst: ? Becomes large. ? Bothers you. ? Has a chance of turning into cancer.  Do not try to open a cyst yourself. Follow these instructions at home:  Take over-the-counter and prescription medicines only as told by your health care provider.  If you were prescribed an antibiotic medicine, take it it as told by your health care provider. Do not stop using the antibiotic even if you start to feel better.  Keep the area around your cyst clean and dry.  Wear loose, dry clothing.  Avoid touching your cyst.  Check your cyst every day for signs of infection. Check for: ? Redness, swelling, or pain. ? Fluid or blood. ? Warmth. ? Pus or a bad smell.  Keep all follow-up visits as told by your health care provider. This is important. How is this prevented?  Wear clean, dry, clothing.  Avoid wearing tight clothing.  Keep your skin clean and dry. Take showers or baths every day. Contact a health care provider if:  Your cyst develops symptoms of infection.  Your condition is not improving or is getting worse.  You develop a cyst that  looks different from other cysts you have had.  You have a fever. Get help right away if:  Redness spreads from the cyst into the surrounding area. Summary  An epidermal cyst is a sac made of skin tissue. These cysts are usually harmless (benign), and they may not cause symptoms unless they become infected.  If a cyst becomes infected, treatment may include surgery to open and drain the cyst, or to  remove it. Treatment may also include medicines by mouth or through an injection.  Take over-the-counter and prescription medicines only as told by your health care provider. If you were prescribed an antibiotic medicine, take it as told by your health care provider. Do not stop using the antibiotic even if you start to feel better.  Contact a health care provider if your condition is not improving or is getting worse.  Keep all follow-up visits as told by your health care provider. This is important. This information is not intended to replace advice given to you by your health care provider. Make sure you discuss any questions you have with your health care provider. Document Revised: 02/28/2019 Document Reviewed: 05/21/2018 Elsevier Patient Education  [employer].   If you have lab work done today you will be contacted with your lab results within the next 2 weeks.  If you have not heard from [place] then please contact us. The fastest way to get your results is to register for My Chart.   IF you received an x-ray today, you will receive an invoice from [facility]. Please contact [facility] at [phone] with questions or concerns regarding your invoice.   IF you received labwork today, you will receive an invoice from [facility]. Please contact [facility] at [phone] with questions or concerns regarding your invoice.   Our billing staff will not be able to assist you with questions regarding bills from these companies.  You will be contacted with the lab results as soon as they are available. The fastest way to get your results is to activate your My Chart account. Instructions are located on the last page of this paperwork. If you have not heard from [place] regarding the results in 2 weeks, please contact this office.

## 2020-05-15 NOTE — Progress Notes (Signed)
Subjective:  Patient ID: Tiffany Gonzales, female    DOB: Dec 28, 1955  Age: 64 y.o. MRN: [id number]  CC:  Chief Complaint  Patient presents with  . R breast cyst/ infection    x 3 days, last cyst 3 yrs ago     HPI Tiffany Gonzales presents for   Possible right breast infection.:  Hx of sebaceous cysts, followed by dermatology.  Current area on R breast past 3 days. Came to head, drained last night, yellow d/c. Small amount of blood.  No fever.  Tx: neosporin, gauze/tape. Alcohol cleansing.  Not as sore today.   Mammogram July 24, 2019 with [facility] mammography.  Annual mammographic evidence of malignancy.  Repeat 1 year.  Scattered fibroglndular density, breast composition category B.  No masses calcifications or other findings seen in either breast. No prior breast surgery.   History Patient Active Problem List   Diagnosis Date Noted  . Plantar fasciitis of right foot 03/13/2020  . Osteoarthritis of both hips 03/13/2020  . Sialolithiasis 08/30/2018  . Breast abscess 04/08/2015  . Memory loss 12/09/2013  . History of insect sting allergy 11/18/2012  . Post-menopause atrophic vaginitis 09/03/2012  . Allergic reaction 06/05/2012  . OSA (obstructive sleep apnea) 11/08/2011  . Cervical disc disease 06/28/2011  . TOBACCO ABUSE 03/01/2010  . LEUKOPENIA, MILD 12/10/2008  . HYPERCHOLESTEROLEMIA 12/09/2008  . Essential hypertension 03/05/2008  . DEPRESSIVE DISORDER 11/13/2007   Past Medical History:  Diagnosis Date  . Allergy    seasonal  . Atypical chest pain 01/2007   negative stress test  . Colon polyps   . Depression   . Fibroids   . Hyperlipidemia   . Hypertension   . Panic attacks    Past Surgical History:  Procedure Laterality Date  . ABDOMINAL HYSTERECTOMY  1998  . CATARACT EXTRACTION    . NASAL SINUS SURGERY  2007   Dr Mikey Bussing out area to help breath   Allergies  Allergen Reactions  . Iodinated Diagnostic Agents Shortness Of Breath  .  Lisinopril Hives  . Shellfish Allergy    Prior to Admission medications   Medication Sig Start Date End Date Taking? Authorizing Provider  aspirin 81 MG tablet Take 81 mg by mouth daily.     Yes [provider]  Cholecalciferol 50 MCG (2000 UT) CAPS Take by mouth.   Yes [provider]  hydrOXYzine (ATARAX/VISTARIL) 10 MG tablet Take 1 tablet (10 mg total) by mouth daily as needed. 09/03/19  Yes Stallings, Zoe A, MD  levocetirizine (XYZAL) 5 MG tablet Take 1 tablet (5 mg total) by mouth daily. 03/13/20  Yes Maximiano Coss, NP  montelukast (SINGULAIR) 10 MG tablet TAKE 1 TABLET BY MOUTH AT BEDTIME. GENERIC EQUIVALENT FOR SINGULAIR 02/20/20  Yes Delia Chimes A, MD  simvastatin (ZOCOR) 20 MG tablet Take 1 tablet (20 mg total) by mouth daily at 6 PM. 02/20/20  Yes Forrest Moron, MD   Social History   Socioeconomic History  . Marital status: Single    Spouse name: Not on file  . Number of children: 2  . Years of education: Not on file  . Highest education level: Not on file  Occupational History  . Occupation: Hydrologist: [employer]  Tobacco Use  . Smoking status: Current Every Day Smoker    Packs/day: 0.30    Years: 34.00    Pack years: 10.20    Types: Cigarettes  . Smokeless tobacco: Never Used  .  Tobacco comment: 7 cigs daily 08/20/13  Substance and Sexual Activity  . Alcohol use: Yes    Alcohol/week: 1.0 standard drink    Types: 1 Glasses of wine per week    Comment: white wine  . Drug use: No  . Sexual activity: Yes    Birth control/protection: Surgical  Other Topics Concern  . 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 Difficulty of Paying Living Expenses:   Food Insecurity:   . Worried About Charity fundraiser in the Last Year:   . Arboriculturist in the Last Year:    Transportation Needs:   . Film/video editor (Medical):   Marland Kitchen Lack of Transportation (Non-Medical):   Physical Activity:   . Days of Exercise per Week:   . Minutes of Exercise per Session:   Stress:   . Feeling of Stress :   Social Connections:   . Frequency of Communication with Friends and Family:   . Frequency of Social Gatherings with Friends and Family:   . Attends Religious Services:   . Active Member of Clubs or Organizations:   . Attends Archivist Meetings:   Marland Kitchen Marital Status:   Intimate Partner Violence:   . Fear of Current or Ex-Partner:   . Emotionally Abused:   Marland Kitchen Physically Abused:   . Sexually Abused:     Review of Systems   Objective:   Vitals:   05/15/20 1432  BP: 118/75  Pulse: 80  Temp: 98.3 F (36.8 C)  SpO2: 99%  Weight: 146 lb (66.2 kg)  Height: 5\' 4"  (1.626 m)     Physical Exam Vitals reviewed. Exam conducted with a chaperone present.  Constitutional:      General: She is not in acute distress.    Appearance: She is well-developed.  HENT:     Head: Normocephalic and atraumatic.  Cardiovascular:     Rate and Rhythm: Normal rate.  Pulmonary:     Effort: Pulmonary effort is normal.  Chest:     Breasts:        Right: No inverted nipple, mass or nipple discharge.     Lymphadenopathy:     Upper Body:     Right upper body: No supraclavicular or axillary adenopathy.  Neurological:     Mental Status: She is alert and oriented to person, place, and time.        Assessment & Plan:  Tiffany Gonzales is a 64 y.o. female . Infected sebaceous cyst - Plan: doxycycline (VIBRA-TABS) 100 MG tablet, WOUND CULTURE Suspected infected sebaceous cyst with drainage on exam, recession of previous erythema around area per patient.  Will cover with doxycycline, culture obtained.  Continue home care with warm compresses and gentle pressure to express sebaceous material or pus.  RTC precautions given for possible incision and drainage.   Deferred today.  Meds ordered this encounter  Medications  . doxycycline (VIBRA-TABS) 100 MG tablet    Sig: Take 1 tablet (100 mg total) by mouth 2 (two) times daily.    Dispense:  20 tablet    Refill:  0   Patient Instructions     Warm compresses with gentle pressure to express pus or sebaceous material afterwards.  Start antibiotic.  If any increased swelling, spreading redness or worsening symptoms return for recheck.  Area  should continue to drain on its own with treatment above.   Epidermal Cyst  An epidermal cyst is a sac made of skin tissue. The sac contains a substance called keratin. Keratin is a protein that is normally secreted through the hair follicles. When keratin becomes trapped in the top layer of skin (epidermis), it can form an epidermal cyst. Epidermal cysts can be found anywhere on your body. These cysts are usually harmless (benign), and they may not cause symptoms unless they become infected. What are the causes? This condition may be caused by:  A blocked hair follicle.  A hair that curls and re-enters the skin instead of growing straight out of the skin (ingrown hair).  A blocked pore.  Irritated skin.  An injury to the skin.  Certain conditions that are passed along from parent to child (inherited).  Human papillomavirus (HPV).  Long-term (chronic) sun damage to the skin. What increases the risk? The following factors may make you more likely to develop an epidermal cyst:  Having acne.  Being overweight.  Being 29-13 years old. What are the signs or symptoms? The only symptom of this condition may be a small, painless lump underneath the skin. When an epidermal cyst ruptures, it may become infected. Symptoms may include:  Redness.  Inflammation.  Tenderness.  Warmth.  Fever.  Keratin draining from the cyst. Keratin is grayish-white, bad-smelling substance.  Pus draining from the cyst. How is this diagnosed? This condition is  diagnosed with a physical exam.  In some cases, you may have a sample of tissue (biopsy) taken from your cyst to be examined under a microscope or tested for bacteria.  You may be referred to a health care provider who specializes in skin care (dermatologist). How is this treated? In many cases, epidermal cysts go away on their own without treatment. If a cyst becomes infected, treatment may include:  Opening and draining the cyst, done by a health care provider. After draining, minor surgery to remove the rest of the cyst may be done.  Antibiotic medicine.  Injections of medicines (steroids) that help to reduce inflammation.  Surgery to remove the cyst. Surgery may be done if the cyst: ? Becomes large. ? Bothers you. ? Has a chance of turning into cancer.  Do not try to open a cyst yourself. Follow these instructions at home:  Take over-the-counter and prescription medicines only as told by your health care provider.  If you were prescribed an antibiotic medicine, take it it as told by your health care provider. Do not stop using the antibiotic even if you start to feel better.  Keep the area around your cyst clean and dry.  Wear loose, dry clothing.  Avoid touching your cyst.  Check your cyst every day for signs of infection. Check for: ? Redness, swelling, or pain. ? Fluid or blood. ? Warmth. ? Pus or a bad smell.  Keep all follow-up visits as told by your health care provider. This is important. How is this prevented?  Wear clean, dry, clothing.  Avoid wearing tight clothing.  Keep your skin clean and dry. Take showers or baths every day. Contact a health care provider if:  Your cyst develops symptoms of infection.  Your condition is not improving or is getting worse.  You develop a cyst that looks different from other cysts you have had.  You have a fever. Get help right away if:  Redness spreads from the cyst into the surrounding area. Summary  An  epidermal cyst is a sac made of skin tissue. These cysts are usually harmless (benign), and they may not cause symptoms unless they become infected.  If a cyst becomes infected, treatment may include surgery to open and drain the cyst, or to remove it. Treatment may also include medicines by mouth or through an injection.  Take over-the-counter and prescription medicines only as told by your health care provider. If you were prescribed an antibiotic medicine, take it as told by your health care provider. Do not stop using the antibiotic even if you start to feel better.  Contact a health care provider if your condition is not improving or is getting worse.  Keep all follow-up visits as told by your health care provider. This is important. This information is not intended to replace advice given to you by your health care provider. Make sure you discuss any questions you have with your health care provider. Document Revised: 02/28/2019 Document Reviewed: 05/21/2018 Elsevier Patient Education  [employer].   If you have lab work done today you will be contacted with your lab results within the next 2 weeks.  If you have not heard from [place] then please contact us. The fastest way to get your results is to register for My Chart.   IF you received an x-ray today, you will receive an invoice from [facility]. Please contact [facility] at [phone] with questions or concerns regarding your invoice.   IF you received labwork today, you will receive an invoice from [facility]. Please contact [facility] at [phone] with questions or concerns regarding your invoice.   Our billing staff will not be able to assist you with questions regarding bills from these companies.  You will be contacted with the lab results as soon as they are available. The fastest way to get your results is to activate your My Chart account. Instructions are located on the last page of this paperwork. If  you have not heard from [place] regarding the results in 2 weeks, please contact this office.          Signed, Merri Ray, MD [facility]

## 2020-05-18 LAB — WOUND CULTURE

## 2020-05-19 ENCOUNTER — Telehealth: Payer: Self-pay | Admitting: Family Medicine

## 2020-05-19 NOTE — Telephone Encounter (Signed)
Pt requesting lab results.

## 2020-05-19 NOTE — Telephone Encounter (Signed)
Sent by [employer]

## 2020-05-19 NOTE — Telephone Encounter (Signed)
Pt Called and ask if some one can call her with her lab Result.please advice

## 2020-05-28 ENCOUNTER — Other Ambulatory Visit: Payer: Self-pay | Admitting: Family Medicine

## 2020-05-28 MED ORDER — SIMVASTATIN 20 MG PO TABS: 20.0000 mg | ORAL_TABLET | Freq: Every day | ORAL | 0 refills | Status: DC

## 2020-05-28 NOTE — Telephone Encounter (Signed)
Medication Refill - Medication: simvastatin (ZOCOR) 20 MG tablet    Has the patient contacted their pharmacy? Yes.   (Agent: If no, request that the patient contact the pharmacy for the refill.) (Agent: If yes, when and what did the pharmacy advise?)no response to request from office   Preferred Pharmacy (with phone number or street name): [facility], [address]  [place], [place] [address]  Phone:  [phone] Fax:  [phone]  Agent: Please be advised that RX refills may take up to 3 business days. We ask that you follow-up with your pharmacy.

## 2020-05-29 ENCOUNTER — Other Ambulatory Visit: Payer: Self-pay

## 2020-05-29 ENCOUNTER — Ambulatory Visit (AMBULATORY_SURGERY_CENTER): Payer: Self-pay

## 2020-05-29 VITALS — Ht 64.0 in | Wt 145.0 lb

## 2020-05-29 DIAGNOSIS — Z1211 Encounter for screening for malignant neoplasm of colon: Secondary | ICD-10-CM

## 2020-05-29 MED ORDER — SUTAB 1479-225-188 MG PO TABS: 12.0000 | ORAL_TABLET | ORAL | 0 refills | Status: DC

## 2020-05-29 NOTE — Progress Notes (Signed)
No egg or soy allergy known to patient  No issues with past sedation with any surgeries  or procedures, no intubation problems  No diet pills per patient No home 02 use per patient  No blood thinners per patient  Pt denies issues with constipation  No A fib or A flutter  EMMI video sent to pt's e mail  COVID 19 guidelines implemented in [place] today   Pt has been vaccinated for covid.  Sutab coupon and code given.  Due to the COVID-19 pandemic we are asking patients to follow these guidelines. Please only bring one care partner. Please be aware that your care partner may wait in the car in the parking lot or if they feel like they will be too hot to wait in the car, they may wait in the lobby on the 4th floor. All care partners are required to wear a mask the entire time (we do not have any that we can provide them), they need to practice social distancing, and we will do a Covid check for all patient's and care partners when you arrive. Also we will check their temperature and your temperature. If the care partner waits in their car they need to stay in the parking lot the entire time and we will call them on their cell phone when the patient is ready for discharge so they can bring the car to the front of the building. Also all patient's will need to wear a mask into building.

## 2020-06-03 ENCOUNTER — Ambulatory Visit: Payer: Medicare HMO | Admitting: Dermatology

## 2020-06-03 ENCOUNTER — Encounter: Payer: Self-pay | Admitting: Dermatology

## 2020-06-03 ENCOUNTER — Other Ambulatory Visit: Payer: Self-pay

## 2020-06-03 DIAGNOSIS — L729 Follicular cyst of the skin and subcutaneous tissue, unspecified: Secondary | ICD-10-CM

## 2020-06-10 ENCOUNTER — Ambulatory Visit (AMBULATORY_SURGERY_CENTER): Payer: Medicare HMO | Admitting: Gastroenterology

## 2020-06-10 ENCOUNTER — Encounter: Payer: Self-pay | Admitting: Gastroenterology

## 2020-06-10 VITALS — BP 146/85 | HR 60 | Temp 97.1°F | Resp 11 | Ht 64.0 in | Wt 145.0 lb

## 2020-06-10 DIAGNOSIS — Z1211 Encounter for screening for malignant neoplasm of colon: Secondary | ICD-10-CM | POA: Diagnosis not present

## 2020-06-10 MED ORDER — SODIUM CHLORIDE 0.9 % IV SOLN: 500.0000 mL | INTRAVENOUS | Status: DC

## 2020-06-10 NOTE — Patient Instructions (Signed)
YOU HAD AN ENDOSCOPIC PROCEDURE TODAY AT [facility]:   Refer to the procedure report that was given to you for any specific questions about what was found during the examination.  If the procedure report does not answer your questions, please call your gastroenterologist to clarify.  If you requested that your care partner not be given the details of your procedure findings, then the procedure report has been included in a sealed envelope for you to review at your convenience later.  YOU SHOULD EXPECT: Some feelings of bloating in the abdomen. Passage of more gas than usual.  Walking can help get rid of the air that was put into your GI tract during the procedure and reduce the bloating. If you had a lower endoscopy (such as a colonoscopy or flexible sigmoidoscopy) you may notice spotting of blood in your stool or on the toilet paper. If you underwent a bowel prep for your procedure, you may not have a normal bowel movement for a few days.  Please Note:  You might notice some irritation and congestion in your nose or some drainage.  This is from the oxygen used during your procedure.  There is no need for concern and it should clear up in a day or so.  SYMPTOMS TO REPORT IMMEDIATELY:   Following lower endoscopy (colonoscopy or flexible sigmoidoscopy):  Excessive amounts of blood in the stool  Significant tenderness or worsening of abdominal pains  Swelling of the abdomen that is new, acute  Fever of 100F or higher    For urgent or emergent issues, a gastroenterologist can be reached at any hour by calling [phone]. Do not use MyChart messaging for urgent concerns.    DIET:  We do recommend a small meal at first, but then you may proceed to your regular diet.  Drink plenty of fluids but you should avoid alcoholic beverages for 24 hours.  ACTIVITY:  You should plan to take it easy for the rest of today and you should NOT DRIVE or use heavy machinery until tomorrow  (because of the sedation medicines used during the test).    FOLLOW UP: Our staff will call the number listed on your records 48-72 hours following your procedure to check on you and address any questions or concerns that you may have regarding the information given to you following your procedure. If we do not reach you, we will leave a message.  We will attempt to reach you two times.  During this call, we will ask if you have developed any symptoms of COVID 19. If you develop any symptoms (ie: fever, flu-like symptoms, shortness of breath, cough etc.) before then, please call [phone].  If you test positive for Covid 19 in the 2 weeks post procedure, please call and report this information to [place].    If any biopsies were taken you will be contacted by phone or by letter within the next 1-3 weeks.  Please call us at [phone] if you have not heard about the biopsies in 3 weeks.    SIGNATURES/CONFIDENTIALITY: You and/or your care partner have signed paperwork which will be entered into your electronic medical record.  These signatures attest to the fact that that the information above on your After Visit Summary has been reviewed and is understood.  Full responsibility of the confidentiality of this discharge information lies with you and/or your care-partner.   Resume medications.Information given on diverticulosis and hemorrhoids.

## 2020-06-10 NOTE — Progress Notes (Signed)
A/ox3, pleased with MAC, report to RN 

## 2020-06-10 NOTE — Progress Notes (Signed)
Vs CW I have reviewed the patient's medical history in detail and updated the computerized patient record.   

## 2020-06-10 NOTE — Op Note (Signed)
[place] Patient Name: Tiffany Gonzales Procedure Date: 06/10/2020 7:59 AM MRN: [id number] Endoscopist: Mauri Pole , MD Age: 64 Referring MD:  Date of Birth: 17-Apr-1956 Gender: Female Account #: [id number] Procedure:                Colonoscopy Indications:              Screening for colorectal malignant neoplasm Medicines:                Monitored Anesthesia Care Procedure:                Pre-Anesthesia Assessment:                           - Prior to the procedure, a History and Physical                            was performed, and patient medications and                            allergies were reviewed. The patient's tolerance of                            previous anesthesia was also reviewed. The risks                            and benefits of the procedure and the sedation                            options and risks were discussed with the patient.                            All questions were answered, and informed consent                            was obtained. Prior Anticoagulants: The patient has                            taken no previous anticoagulant or antiplatelet                            agents. ASA Grade Assessment: II - A patient with                            mild systemic disease. After reviewing the risks                            and benefits, the patient was deemed in                            satisfactory condition to undergo the procedure.                           After obtaining informed consent, the colonoscope  was passed under direct vision. Throughout the                            procedure, the patient's blood pressure, pulse, and                            oxygen saturations were monitored continuously. The                            Colonoscope was introduced through the anus and                            advanced to the the cecum, identified by                            appendiceal orifice  and ileocecal valve. The                            colonoscopy was performed without difficulty. The                            patient tolerated the procedure well. The quality                            of the bowel preparation was excellent. The                            ileocecal valve, appendiceal orifice, and rectum                            were photographed. Scope In: 8:14:12 AM Scope Out: 8:26:47 AM Scope Withdrawal Time: 0 hours 8 minutes 24 seconds  Total Procedure Duration: 0 hours 12 minutes 35 seconds  Findings:                 The perianal and digital rectal examinations were                            normal.                           Scattered small and large-mouthed diverticula were                            found in the sigmoid colon, descending colon,                            transverse colon and ascending colon.                           There was a medium-sized lipoma, at the ileocecal                            valve.  Non-bleeding internal hemorrhoids were found during                            retroflexion. The hemorrhoids were small.                           The exam was otherwise without abnormality. Complications:            No immediate complications. Estimated Blood Loss:     Estimated blood loss was minimal. Impression:               - Moderate diverticulosis in the sigmoid colon, in                            the descending colon, in the transverse colon and                            in the ascending colon.                           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                           - Non-bleeding internal hemorrhoids.                           - The examination was otherwise normal.                           - No specimens collected. Recommendation:           - Patient has a contact number available for                            emergencies. The signs and symptoms of potential                             delayed complications were discussed with the                            patient. Return to normal activities tomorrow.                            Written discharge instructions were provided to the                            patient.                           - Resume previous diet.                           - Continue present medications.                           - Repeat colonoscopy in 10 years for screening  purposes. Mauri Pole, MD 06/10/2020 8:38:28 AM This report has been signed electronically.

## 2020-06-12 ENCOUNTER — Telehealth: Payer: Self-pay | Admitting: *Deleted

## 2020-06-12 NOTE — Telephone Encounter (Signed)
  Follow up Call-  Call back number 06/10/2020  Post procedure Call Back phone  # [phone]  Permission to leave phone message Yes  Some recent data might be hidden     Patient questions:  Do you have a fever, pain , or abdominal swelling? No. Pain Score  0 *  Have you tolerated food without any problems? Yes.    Have you been able to return to your normal activities? Yes.    Do you have any questions about your discharge instructions: Diet   No. Medications  No. Follow up visit  No.  Do you have questions or concerns about your Care? No.  Actions: * If pain score is 4 or above: 1. No action needed, pain <4.Have you developed a fever since your procedure? no  2.   Have you had an respiratory symptoms (SOB or cough) since your procedure? no  3.   Have you tested positive for COVID 19 since your procedure no  4.   Have you had any family members/close contacts diagnosed with the COVID 19 since your procedure?  no   If yes to any of these questions please route to Joylene John, RN and Erenest Rasher, RN

## 2020-06-23 DIAGNOSIS — H35033 Hypertensive retinopathy, bilateral: Secondary | ICD-10-CM | POA: Diagnosis not present

## 2020-06-23 DIAGNOSIS — H04123 Dry eye syndrome of bilateral lacrimal glands: Secondary | ICD-10-CM | POA: Diagnosis not present

## 2020-06-23 DIAGNOSIS — Z961 Presence of intraocular lens: Secondary | ICD-10-CM | POA: Diagnosis not present

## 2020-06-23 DIAGNOSIS — H26492 Other secondary cataract, left eye: Secondary | ICD-10-CM | POA: Diagnosis not present

## 2020-06-28 ENCOUNTER — Encounter: Payer: Self-pay | Admitting: Dermatology

## 2020-06-28 NOTE — Progress Notes (Addendum)
   Follow-Up Visit   Subjective  Tiffany Gonzales is a 64 y.o. female who presents for the following: Skin Problem (cyst under Right breast-  tx doxy & warm compress).  Cyst Location: Right breast Duration:  Quality:  Associated Signs/Symptoms: Modifying Factors:  Severity:  Timing: Context:   Objective  Well appearing patient in no apparent distress; mood and affect are within normal limits.  A focused examination was performed including Chest, breast, abdomen, head and neck.. Relevant physical exam findings are noted in the Assessment and Plan.   Assessment & Plan    Cyst of skin Right Breast  Carefully explained the difference between a breast cyst and a skin cyst; Mrs. Tiffany Gonzales was understandably worried about the possibility of malignancy.  Although she will think over her future surgical scheduling for elective removal, I suspect she will choose to leave this unless he gets recurrently inflamed.     I, Tiffany Monarch, MD, have reviewed all documentation for this visit.  The documentation on 07/19/20 for the exam, diagnosis, procedures, and orders are all accurate and complete.

## 2020-06-29 ENCOUNTER — Encounter: Payer: Medicare HMO | Admitting: Family Medicine

## 2020-07-30 DIAGNOSIS — H04123 Dry eye syndrome of bilateral lacrimal glands: Secondary | ICD-10-CM | POA: Diagnosis not present

## 2020-07-30 DIAGNOSIS — H35033 Hypertensive retinopathy, bilateral: Secondary | ICD-10-CM | POA: Diagnosis not present

## 2020-07-30 DIAGNOSIS — Z961 Presence of intraocular lens: Secondary | ICD-10-CM | POA: Diagnosis not present

## 2020-07-30 DIAGNOSIS — H26492 Other secondary cataract, left eye: Secondary | ICD-10-CM | POA: Diagnosis not present

## 2020-07-30 DIAGNOSIS — Z1231 Encounter for screening mammogram for malignant neoplasm of breast: Secondary | ICD-10-CM | POA: Diagnosis not present

## 2020-08-01 LAB — HM MAMMOGRAPHY

## 2020-08-10 ENCOUNTER — Encounter: Payer: Self-pay | Admitting: Family Medicine

## 2020-09-03 ENCOUNTER — Other Ambulatory Visit: Payer: Self-pay | Admitting: Family Medicine

## 2020-11-02 ENCOUNTER — Other Ambulatory Visit: Payer: Self-pay | Admitting: Registered Nurse

## 2020-11-02 ENCOUNTER — Other Ambulatory Visit: Payer: Self-pay | Admitting: Family Medicine

## 2020-11-02 DIAGNOSIS — J302 Other seasonal allergic rhinitis: Secondary | ICD-10-CM

## 2020-11-05 ENCOUNTER — Other Ambulatory Visit: Payer: Self-pay

## 2020-11-05 ENCOUNTER — Ambulatory Visit (INDEPENDENT_AMBULATORY_CARE_PROVIDER_SITE_OTHER): Payer: Medicare HMO | Admitting: Family Medicine

## 2020-11-05 ENCOUNTER — Encounter: Payer: Self-pay | Admitting: Family Medicine

## 2020-11-05 VITALS — BP 138/90 | HR 75 | Temp 98.3°F | Ht 64.0 in | Wt 151.0 lb

## 2020-11-05 DIAGNOSIS — M25562 Pain in left knee: Secondary | ICD-10-CM

## 2020-11-05 DIAGNOSIS — E7800 Pure hypercholesterolemia, unspecified: Secondary | ICD-10-CM | POA: Diagnosis not present

## 2020-11-05 DIAGNOSIS — J302 Other seasonal allergic rhinitis: Secondary | ICD-10-CM

## 2020-11-05 MED ORDER — MONTELUKAST SODIUM 10 MG PO TABS: ORAL_TABLET | ORAL | 3 refills | Status: DC

## 2020-11-05 MED ORDER — SIMVASTATIN 20 MG PO TABS: ORAL_TABLET | ORAL | 3 refills | Status: DC

## 2020-11-05 MED ORDER — MONTELUKAST SODIUM 10 MG PO TABS: ORAL_TABLET | ORAL | 3 refills | Status: AC

## 2020-11-05 NOTE — Progress Notes (Signed)
12/16/20212:32 PM  Hermosa 05-18-1956, 64 y.o., female [id number]  Chief Complaint  Patient presents with  . Hypertension    Medication refills   . Medication Refill    All medications to [facility] 90 day supply and singulair to local    HPI:   Patient is a 64 y.o. female with past medical history significant for HLD, allergies who presents today for medications refills.  HTN BP Readings from Last 3 Encounters:  11/05/20 138/90  06/10/20 (!) 146/85  05/15/20 118/75   Has been struggling with allergies this season Content with medication regimen  Depression screen [facility] 2/9 11/05/2020 05/15/2020 03/13/2020  Decreased Interest 0 0 0  Down, Depressed, Hopeless 0 0 0  PHQ - 2 Score 0 0 0    Fall Risk  11/05/2020 05/15/2020 03/13/2020 03/01/2019 09/17/2018  Falls in the past year? 0 0 0 0 No  Number falls in past yr: 0 0 0 0 -  Injury with Fall? 0 0 0 0 -  Comment - - - - -  Follow up Falls evaluation completed Falls evaluation completed Falls evaluation completed - -     Allergies  Allergen Reactions  . Iodinated Diagnostic Agents Shortness Of Breath  . Lisinopril Hives  . Shellfish Allergy     Prior to Admission medications   Medication Sig Start Date End Date Taking? Authorizing Provider  aspirin 81 MG tablet Take 81 mg by mouth daily.      [provider]  Cholecalciferol 50 MCG (2000 UT) CAPS Take by mouth.    [provider]  Ginkgo Biloba 40 MG TABS Take 120 mg by mouth daily.    [provider]  hydrOXYzine (ATARAX/VISTARIL) 10 MG tablet Take 1 tablet (10 mg total) by mouth daily as needed. 09/03/19   Forrest Moron, MD  levocetirizine (XYZAL) 5 MG tablet Take 1 tablet (5 mg total) by mouth daily. 03/13/20   Maximiano Coss, NP  montelukast (SINGULAIR) 10 MG tablet TAKE 1 TABLET BY MOUTH AT BEDTIME. GENERIC EQUIVALENT FOR SINGULAIR 02/20/20   Forrest Moron, MD  Multiple Vitamins-Minerals (MULTIVITAMIN GUMMIES ADULT PO) Take  2 each by mouth daily.    [provider]  simvastatin (ZOCOR) 20 MG tablet TAKE 1 TABLET EVERY DAY AT 6 PM 09/03/20   Wendie Agreste, MD  SUTAB [phone] MG TABS Take 12 tablets by mouth as directed. Take 12 tabs po the night before colonoscopy as directed then 12 tablets the morning before the colonoscopy as directed 05/29/20   Mauri Pole, MD    Past Medical History:  Diagnosis Date  . Allergy    seasonal  . Allergy    foods-corn, pork, apples, shellfish  . Atypical chest pain 01/2007   negative stress test  . Colon polyps   . Depression   . Fibroids   . Hyperlipidemia   . Hypertension   . Panic attacks   . Sleep apnea    uses cpap    Past Surgical History:  Procedure Laterality Date  . ABDOMINAL HYSTERECTOMY  1998  . CATARACT EXTRACTION    . COLONOSCOPY  03/30/2010   hyperplastic polyps  . NASAL SINUS SURGERY  2007   Dr Mikey Bussing out area to help breath    Social History   Tobacco Use  . Smoking status: Former Smoker    Packs/day: 0.30    Years: 34.00    Pack years: 10.20    Types: Cigarettes    Quit date:  08/13/2018    Years since quitting: 2.2  . Smokeless tobacco: Never Used  . Tobacco comment: 7 cigs daily 08/20/13  Substance Use Topics  . Alcohol use: Yes    Alcohol/week: 1.0 standard drink    Types: 1 Glasses of wine per week    Comment: per day    Family History  Problem Relation Age of Onset  . Arthritis Mother   . Hypertension Father   . Heart disease Maternal Grandmother   . Heart disease Paternal Grandmother     Review of Systems  Constitutional: Negative for chills, fever and malaise/fatigue.  Eyes: Negative for blurred vision and double vision.  Respiratory: Negative for cough, shortness of breath and wheezing.   Cardiovascular: Negative for chest pain, palpitations and leg swelling.  Gastrointestinal: Negative for abdominal pain, blood in stool, constipation, diarrhea, heartburn, nausea and vomiting.   Genitourinary: Negative for dysuria, frequency and hematuria.  Musculoskeletal: Positive for joint pain (Left knee pain: Denies trauma or instability). Negative for back pain.  Skin: Negative for rash.  Neurological: Negative for dizziness, weakness and headaches.     OBJECTIVE:  Today's Vitals   11/05/20 1303 11/05/20 1314  BP: (!) 156/93 138/90  Pulse: 75   Temp: 98.3 F (36.8 C)   SpO2: 96%   Weight: 151 lb (68.5 kg)   Height: 5\' 4"  (1.626 m)    Body mass index is 25.92 kg/m.   Physical Exam Constitutional:      General: She is not in acute distress.    Appearance: Normal appearance. She is not ill-appearing.  HENT:     Head: Normocephalic.  Cardiovascular:     Rate and Rhythm: Normal rate and regular rhythm.     Pulses: Normal pulses.     Heart sounds: Normal heart sounds. No murmur heard. No friction rub. No gallop.   Pulmonary:     Effort: Pulmonary effort is normal. No respiratory distress.     Breath sounds: Normal breath sounds. No stridor. No wheezing, rhonchi or rales.  Abdominal:     General: Bowel sounds are normal.     Palpations: Abdomen is soft.     Tenderness: There is no abdominal tenderness.  Musculoskeletal:     Right knee: Normal.     Left knee: No bony tenderness. Tenderness present over the medial joint line.     Right lower leg: Normal. No edema.     Left lower leg: Normal. No edema.  Skin:    General: Skin is warm and dry.  Neurological:     Mental Status: She is alert and oriented to person, place, and time.  Psychiatric:        Mood and Affect: Mood normal.        Behavior: Behavior normal.     No results found for this or any previous visit (from the past 24 hour(s)).  No results found.   ASSESSMENT and PLAN  Problem List Items Addressed This Visit      Other   HYPERCHOLESTEROLEMIA - Primary   Relevant Medications   simvastatin (ZOCOR) 20 MG tablet Will follow up with labs in April at annual    Other Visit Diagnoses     Other seasonal allergic rhinitis       Relevant Medications   montelukast (SINGULAIR) 10 MG tablet  Stable on current regimen   Acute pain of left knee     Discussed conservative treatment Rest, Ice, Elevation, Brace     Refills sent Will follow up  with labs at annual in 4 months   Return in about 4 months (around 03/06/2021) for annual physical.    Huston Foley Crystall Donaldson, FNP-BC [facility] [place], [place] [address] Ph.  [phone] Fax [phone]

## 2020-11-05 NOTE — Patient Instructions (Addendum)
Acute Knee Pain, Adult Many things can cause knee pain. Sometimes, knee pain is sudden (acute) and may be caused by damage, swelling, or irritation of the muscles and tissues that support your knee. The pain often goes away on its own with time and rest. If the pain does not go away, tests may be done to find out what is causing the pain. Follow these instructions at home: Pay attention to any changes in your symptoms. Take these actions to relieve your pain. If you have a knee sleeve or brace:   Wear the sleeve or brace as told by your doctor. Remove it only as told by your doctor.  Loosen the sleeve or brace if your toes: ? Tingle. ? Become numb. ? Turn cold and blue.  Keep the sleeve or brace clean.  If the sleeve or brace is not waterproof: ? Do not let it get wet. ? Cover it with a watertight covering when you take a bath or shower. Activity  Rest your knee.  Do not do things that cause pain.  Avoid activities where both feet leave the ground at the same time (high-impact activities). Examples are running, jumping rope, and doing jumping jacks.  Work with a physical therapist to make a safe exercise program, as told by your doctor. Managing pain, stiffness, and swelling   If told, put ice on the knee: ? Put ice in a plastic bag. ? Place a towel between your skin and the bag. ? Leave the ice on for 20 minutes, 2-3 times a day.  If told, put pressure (compression) on your injured knee to control swelling, give support, and help with discomfort. Compression may be done with an elastic bandage. General instructions  Take all medicines only as told by your doctor.  Raise (elevate) your knee while you are sitting or lying down. Make sure your knee is higher than your heart.  Sleep with a pillow under your knee.  Do not use any products that contain nicotine or tobacco. These include cigarettes, e-cigarettes, and chewing tobacco. These products may slow down healing.  If you need help quitting, ask your doctor.  If you are overweight, work with your doctor and a food expert (dietitian) to set goals to lose weight. Being overweight can make your knee hurt more.  Keep all follow-up visits as told by your doctor. This is important. Contact a doctor if:  The knee pain does not stop.  The knee pain changes or gets worse.  You have a fever along with knee pain.  Your knee feels warm when you touch it.  Your knee gives out or locks up. Get help right away if:  Your knee swells, and the swelling gets worse.  You cannot move your knee.  You have very bad knee pain. Summary  Many things can cause knee pain. The pain often goes away on its own with time and rest.  Your doctor may do tests to find out the cause of the pain.  Pay attention to any changes in your symptoms. Relieve your pain with rest, medicines, light activity, and use of ice.  Get help right away if you cannot move your knee or your knee pain is very bad. This information is not intended to replace advice given to you by your health care provider. Make sure you discuss any questions you have with your health care provider. Document Revised: 04/19/2018 Document Reviewed: 04/19/2018 Elsevier Patient Education  [employer].    If  you have lab work done today you will be contacted with your lab results within the next 2 weeks.  If you have not heard from [place] then please contact us. The fastest way to get your results is to register for My Chart.   IF you received an x-ray today, you will receive an invoice from [facility]. Please contact [facility] at [phone] with questions or concerns regarding your invoice.   IF you received labwork today, you will receive an invoice from [facility]. Please contact [facility] at [phone] with questions or concerns regarding your invoice.   Our billing staff will not be able to assist you with questions regarding bills  from these companies.  You will be contacted with the lab results as soon as they are available. The fastest way to get your results is to activate your My Chart account. Instructions are located on the last page of this paperwork. If you have not heard from [place] regarding the results in 2 weeks, please contact this office.      Health Maintenance After Age 1 After age 64, you are at a higher risk for certain long-term diseases and infections as well as injuries from falls. Falls are a major cause of broken bones and head injuries in people who are older than age 47. Getting regular preventive care can help to keep you healthy and well. Preventive care includes getting regular testing and making lifestyle changes as recommended by your health care provider. Talk with your health care provider about:  Which screenings and tests you should have. A screening is a test that checks for a disease when you have no symptoms.  A diet and exercise plan that is right for you. What should I know about screenings and tests to prevent falls? Screening and testing are the best ways to find a health problem early. Early diagnosis and treatment give you the best chance of managing medical conditions that are common after age 71. Certain conditions and lifestyle choices may make you more likely to have a fall. Your health care provider may recommend:  Regular vision checks. Poor vision and conditions such as cataracts can make you more likely to have a fall. If you wear glasses, make sure to get your prescription updated if your vision changes.  Medicine review. Work with your health care provider to regularly review all of the medicines you are taking, including over-the-counter medicines. Ask your health care provider about any side effects that may make you more likely to have a fall. Tell your health care provider if any medicines that you take make you feel dizzy or sleepy.  Osteoporosis screening.  Osteoporosis is a condition that causes the bones to get weaker. This can make the bones weak and cause them to break more easily.  Blood pressure screening. Blood pressure changes and medicines to control blood pressure can make you feel dizzy.  Strength and balance checks. Your health care provider may recommend certain tests to check your strength and balance while standing, walking, or changing positions.  Foot health exam. Foot pain and numbness, as well as not wearing proper footwear, can make you more likely to have a fall.  Depression screening. You may be more likely to have a fall if you have a fear of falling, feel emotionally low, or feel unable to do activities that you used to do.  Alcohol use screening. Using too much alcohol can affect your balance and may make you more likely to have a  fall. What actions can I take to lower my risk of falls? General instructions  Talk with your health care provider about your risks for falling. Tell your health care provider if: ? You fall. Be sure to tell your health care provider about all falls, even ones that seem minor. ? You feel dizzy, sleepy, or off-balance.  Take over-the-counter and prescription medicines only as told by your health care provider. These include any supplements.  Eat a healthy diet and maintain a healthy weight. A healthy diet includes low-fat dairy products, low-fat (lean) meats, and fiber from whole grains, beans, and lots of fruits and vegetables. Home safety  Remove any tripping hazards, such as rugs, cords, and clutter.  Install safety equipment such as grab bars in bathrooms and safety rails on stairs.  Keep rooms and walkways well-lit. Activity   Follow a regular exercise program to stay fit. This will help you maintain your balance. Ask your health care provider what types of exercise are appropriate for you.  If you need a cane or walker, use it as recommended by your health care provider.  Wear  supportive shoes that have nonskid soles. Lifestyle  Do not drink alcohol if your health care provider tells you not to drink.  If you drink alcohol, limit how much you have: ? 0-1 drink a day for women. ? 0-2 drinks a day for men.  Be aware of how much alcohol is in your drink. In the [place] one drink equals one typical bottle of beer (12 oz), one-half glass of wine (5 oz), or one shot of hard liquor (1 oz).  Do not use any products that contain nicotine or tobacco, such as cigarettes and e-cigarettes. If you need help quitting, ask your health care provider. Summary  Having a healthy lifestyle and getting preventive care can help to protect your health and wellness after age 5.  Screening and testing are the best way to find a health problem early and help you avoid having a fall. Early diagnosis and treatment give you the best chance for managing medical conditions that are more common for people who are older than age 60.  Falls are a major cause of broken bones and head injuries in people who are older than age 55. Take precautions to prevent a fall at home.  Work with your health care provider to learn what changes you can make to improve your health and wellness and to prevent falls. This information is not intended to replace advice given to you by your health care provider. Make sure you discuss any questions you have with your health care provider. Document Revised: 02/28/2019 Document Reviewed: 09/20/2017 Elsevier Patient Education  2020 [employer].

## 2020-12-26 ENCOUNTER — Encounter (HOSPITAL_BASED_OUTPATIENT_CLINIC_OR_DEPARTMENT_OTHER): Payer: Self-pay | Admitting: Emergency Medicine

## 2020-12-26 ENCOUNTER — Emergency Department (HOSPITAL_BASED_OUTPATIENT_CLINIC_OR_DEPARTMENT_OTHER)
Admission: EM | Admit: 2020-12-26 | Discharge: 2020-12-26 | Disposition: A | Discharge status: Home / Self Care | Payer: Medicare HMO | Attending: Emergency Medicine | Admitting: Emergency Medicine

## 2020-12-26 ENCOUNTER — Other Ambulatory Visit: Payer: Self-pay

## 2020-12-26 ENCOUNTER — Emergency Department (HOSPITAL_BASED_OUTPATIENT_CLINIC_OR_DEPARTMENT_OTHER): Payer: Medicare HMO

## 2020-12-26 DIAGNOSIS — R519 Headache, unspecified: Secondary | ICD-10-CM | POA: Diagnosis present

## 2020-12-26 DIAGNOSIS — Z87891 Personal history of nicotine dependence: Secondary | ICD-10-CM | POA: Insufficient documentation

## 2020-12-26 DIAGNOSIS — Z7982 Long term (current) use of aspirin: Secondary | ICD-10-CM | POA: Diagnosis not present

## 2020-12-26 DIAGNOSIS — R42 Dizziness and giddiness: Secondary | ICD-10-CM

## 2020-12-26 DIAGNOSIS — I10 Essential (primary) hypertension: Secondary | ICD-10-CM | POA: Diagnosis not present

## 2020-12-26 LAB — TROPONIN I (HIGH SENSITIVITY)
Troponin I (High Sensitivity): 3 ng/L (ref <18)
Troponin I (High Sensitivity): 3 ng/L (ref <18)

## 2020-12-26 LAB — CBC
HCT: 37.1 % (ref 36.0–46.0)
Hemoglobin: 13.3 g/dL (ref 12.0–15.0)
MCH: 30.1 pg (ref 26.0–34.0)
MCHC: 35.8 g/dL (ref 30.0–36.0)
MCV: 83.9 fL (ref 80.0–100.0)
Platelets: 248 10*3/uL (ref 150–400)
RBC: 4.42 MIL/uL (ref 3.87–5.11)
RDW: 13.2 % (ref 11.5–15.5)
WBC: 4.2 10*3/uL (ref 4.0–10.5)
nRBC: 0 % (ref 0.0–0.2)

## 2020-12-26 LAB — BASIC METABOLIC PANEL
Anion gap: 9 (ref 5–15)
BUN: 14 mg/dL (ref 8–23)
CO2: 27 mmol/L (ref 22–32)
Calcium: 9 mg/dL (ref 8.9–10.3)
Chloride: 102 mmol/L (ref 98–111)
Creatinine, Ser: 0.89 mg/dL (ref 0.44–1.00)
GFR, Estimated: >60 mL/min (ref >60)
Glucose, Bld: 94 mg/dL (ref 70–99)
Potassium: 4 mmol/L (ref 3.5–5.1)
Sodium: 138 mmol/L (ref 135–145)

## 2020-12-26 MED ORDER — AMLODIPINE BESYLATE 5 MG PO TABS
5.0000 mg | ORAL_TABLET | Freq: Once | ORAL | Status: AC
  Administered 2020-12-26: 5 mg via ORAL
  Filled 2020-12-26: qty 1

## 2020-12-26 MED ORDER — AMLODIPINE BESYLATE 5 MG PO TABS: 5.0000 mg | ORAL_TABLET | Freq: Every day | ORAL | 0 refills | Status: DC

## 2020-12-26 NOTE — ED Triage Notes (Signed)
Pt arrives pov with driver, c/o hypertension and HA on top of head with a feeling of dizziness x 2 days. Pt bilaterally equal, denies numbness or tingling. VAN neg

## 2020-12-26 NOTE — ED Provider Notes (Signed)
[facility] EMERGENCY DEPARTMENT Provider Note   CSN: [id number] Arrival date & time: 12/26/20  1538     History Chief Complaint  Patient presents with  . Hypertension    Tiffany Gonzales is a 65 y.o. female history of hypertension hyperlipidemia.  Patient reports several months ago she was taken off her antihypertensives by her PCP and she has had normal blood pressures since that time.    Patient reports that she woke up this morning 7:30 AM and noticed a slight room spinning sensation, this was associated with a mild generalized headache that did not radiate and there are no aggravating or alleviating factors.  Patient then checked her blood pressure and noted to be [id number] systolic, she checked it multiple times throughout the day noted to be elevated each time.  Patient feels the dizzy sensation is worse with positional changes and has been constant since onset.  She denies similar symptoms in the past.  Patient reports that yesterday she had been feeling well, somewhat more tired than normal but denies any recent illness.  She denies vision loss, speech difficulty, neck stiffness/neck pain, sore throat, chest pain/shortness of breath, abdominal pain, vomiting, diarrhea, extremity numbness/tingling, weakness or any additional concerns.  Patient's husband at bedside corroborates patient story.  HPI     Past Medical History:  Diagnosis Date  . Allergy    seasonal  . Allergy    foods-corn, pork, apples, shellfish  . Atypical chest pain 01/2007   negative stress test  . Colon polyps   . Depression   . Fibroids   . Hyperlipidemia   . Hypertension   . Panic attacks   . Sleep apnea    uses cpap    Patient Active Problem List   Diagnosis Date Noted  . Plantar fasciitis of right foot 03/13/2020  . Osteoarthritis of both hips 03/13/2020  . Sialolithiasis 08/30/2018  . Breast abscess 04/08/2015  . Memory loss 12/09/2013  . History of insect sting allergy 11/18/2012   . Post-menopause atrophic vaginitis 09/03/2012  . Allergic reaction 06/05/2012  . OSA (obstructive sleep apnea) 11/08/2011  . Cervical disc disease 06/28/2011  . TOBACCO ABUSE 03/01/2010  . LEUKOPENIA, MILD 12/10/2008  . HYPERCHOLESTEROLEMIA 12/09/2008  . Essential hypertension 03/05/2008  . DEPRESSIVE DISORDER 11/13/2007    Past Surgical History:  Procedure Laterality Date  . ABDOMINAL HYSTERECTOMY  1998  . CATARACT EXTRACTION    . COLONOSCOPY  03/30/2010   hyperplastic polyps  . NASAL SINUS SURGERY  2007   Dr Mikey Bussing out area to help breath     OB History   No obstetric history on file.     Family History  Problem Relation Age of Onset  . Arthritis Mother   . Hypertension Father   . Heart disease Maternal Grandmother   . Heart disease Paternal Grandmother     Social History   Tobacco Use  . Smoking status: Former Smoker    Packs/day: 0.30    Years: 34.00    Pack years: 10.20    Types: Cigarettes    Quit date: 08/13/2018    Years since quitting: 2.3  . Smokeless tobacco: Never Used  . Tobacco comment: 7 cigs daily 08/20/13  Substance Use Topics  . Alcohol use: Yes    Alcohol/week: 1.0 standard drink    Types: 1 Glasses of wine per week    Comment: per day  . Drug use: No    Home Medications Prior to Admission medications   Medication  Sig Start Date End Date Taking? Authorizing Provider  amLODipine (NORVASC) 5 MG tablet Take 1 tablet (5 mg total) by mouth daily. 12/26/20  Yes Nuala Alpha A, PA-C  aspirin 81 MG tablet Take 81 mg by mouth daily.    [provider]  Cholecalciferol 50 MCG (2000 UT) CAPS Take by mouth.    [provider]  FIBER ADULT GUMMIES PO Take by mouth.    [provider]  Ginkgo Biloba 40 MG TABS Take 120 mg by mouth daily.    [provider]  hydrOXYzine (ATARAX/VISTARIL) 10 MG tablet Take 1 tablet (10 mg total) by mouth daily as needed. 09/03/19   Forrest Moron, MD  levocetirizine  (XYZAL) 5 MG tablet Take 1 tablet (5 mg total) by mouth daily. 03/13/20   Maximiano Coss, NP  Melatonin 5 MG CAPS Take by mouth.    [provider]  montelukast (SINGULAIR) 10 MG tablet TAKE 1 TABLET BY MOUTH AT BEDTIME. GENERIC EQUIVALENT FOR SINGULAIR 11/05/20   Just, Laurita Quint, FNP  Multiple Vitamins-Minerals (MULTIVITAMIN GUMMIES ADULT PO) Take 2 each by mouth daily.    [provider]  simvastatin (ZOCOR) 20 MG tablet TAKE 1 TABLET EVERY DAY AT 6 PM 11/05/20   Just, Laurita Quint, FNP    Allergies    Iodinated diagnostic agents, Lisinopril, and Shellfish allergy  Review of Systems   Review of Systems Ten systems are reviewed and are negative for acute change except as noted in the HPI  Physical Exam Updated Vital Signs BP (!) 164/97 (BP Location: Right Arm)   Pulse (!) 55   Temp 98.3 F (36.8 C) (Oral)   Resp 18   Ht 5\' 4"  (1.626 m)   Wt 68.5 kg   SpO2 100%   BMI 25.92 kg/m   Physical Exam Constitutional:      General: She is not in acute distress.    Appearance: Normal appearance. She is well-developed. She is not ill-appearing or diaphoretic.  HENT:     Head: Normocephalic and atraumatic.  Eyes:     General: Vision grossly intact. Gaze aligned appropriately.     Extraocular Movements: Extraocular movements intact.     Conjunctiva/sclera: Conjunctivae normal.     Pupils: Pupils are equal, round, and reactive to light.     Comments: No nystagmus. Visual fields grossly intact bilaterally.  Neck:     Trachea: Trachea and phonation normal.  Pulmonary:     Effort: Pulmonary effort is normal. No respiratory distress.  Abdominal:     General: There is no distension.     Palpations: Abdomen is soft.     Tenderness: There is no abdominal tenderness. There is no guarding or rebound.  Musculoskeletal:        General: Normal range of motion.     Cervical back: Normal range of motion.  Skin:    General: Skin is warm and dry.  Neurological:     Mental Status:  She is alert.     GCS: GCS eye subscore is 4. GCS verbal subscore is 5. GCS motor subscore is 6.     Comments: Mental Status: Alert, oriented, thought content appropriate, able to give a coherent history. Speech fluent without evidence of aphasia. Able to follow 2 step commands without difficulty. Cranial Nerves: II: Peripheral visual fields grossly normal, pupils equal, round, reactive to light III,IV, VI: ptosis not present, extra-ocular motions intact bilaterally V,VII: smile symmetric, eyebrows raise symmetric, facial light touch sensation equal VIII: hearing  grossly normal to voice X: uvula elevates symmetrically XI: bilateral shoulder shrug symmetric and strong XII: midline tongue extension without fassiculations Motor: Normal tone. 5/5 strength in upper and lower extremities bilaterally including strong and equal grip strength and dorsiflexion/plantar flexion Sensory: Sensation intact to light touch in all extremities.Negative Romberg.  Cerebellar: normal finger-to-nose with bilateral upper extremities. Normal heel-to -shin balance bilaterally of the lower extremity. No pronator drift.  Gait: normal gait and balance CV: distal pulses palpable throughout  Psychiatric:        Behavior: Behavior normal.    ED Results / Procedures / Treatments   Labs (all labs ordered are listed, but only abnormal results are displayed) Labs Reviewed  BASIC METABOLIC PANEL  CBC  TROPONIN I (HIGH SENSITIVITY)  TROPONIN I (HIGH SENSITIVITY)    EKG EKG Interpretation  Date/Time:  Saturday December 26 2020 15:56:34 EST Ventricular Rate:  62 PR Interval:  164 QRS Duration: 72 QT Interval:  410 QTC Calculation: 416 R Axis:   -12 Text Interpretation: Normal sinus rhythm No previous tracing Confirmed by Lajean Saver [phone]) on 12/26/2020 5:37:21 PM   Radiology No results found.  Procedures Procedures   Medications Ordered in ED Medications  amLODipine (NORVASC) tablet 5 mg (5  mg Oral Given 12/26/20 1923)    ED Course  I have reviewed the triage vital signs and the nursing notes.  Pertinent labs & imaging results that were available during my care of the patient were reviewed by me and considered in my medical decision making (see chart for details).  Clinical Course as of 12/26/20 1951  Sat Dec 26, 2020  1909 CLINICAL DATA: Two day history of headache, fatigue and dizziness. Hypertension.  EXAM: MRI HEAD WITHOUT CONTRAST  TECHNIQUE: Multiplanar, multiecho pulse sequences of the brain and surrounding structures were obtained without intravenous contrast.  COMPARISON: Head CT 03/13/2019  FINDINGS: Brain: Diffusion imaging does not show any acute or subacute infarction. No abnormality affects the brainstem or cerebellum. Cerebral hemispheres show a few small foci of T2 and FLAIR signal within the white matter, likely the earliest manifestation of small vessel disease at this age. No cortical or large vessel territory infarction. No mass lesion, hemorrhage, hydrocephalus or extra-axial collection.  Vascular: Major vessels at the base of the brain show flow.  Skull and upper cervical spine: Negative  Sinuses/Orbits: Clear/normal  Other: None  IMPRESSION: No acute or reversible finding. Mild chronic appearing small vessel change of the cerebral hemispheric white matter, often seen at this age.   Electronically Signed By: Nelson Chimes M.D. On: 12/26/2020 18:53 [BM]    Clinical Course User Index [BM] Gari Crown   MDM Rules/Calculators/A&P                         Additional history obtained from: 1. Nursing notes from this visit. 2. Family at bedside. 3. EMR review. ------------------- I reviewed and interpreted labs which include: CBC with hallux ptosis to suggest infectious process, no anemia. Initial and delta high-sensitivity troponins within normal limits, (3->3). BMP shows no emergent electrolyte derangement, AKI or  gap.  EKG: Normal sinus rhythm No previous tracing Confirmed by Lajean Saver [phone]) on 12/26/2020 5:37:21 PM  MRI Brain: IMPRESSION: No acute or reversible finding. Mild chronic appearing small vessel change of the cerebral hemispheric white matter, often seen at this age.  Patient reassessed resting comfortably no acute distress reports she is feeling well.  They were updated on findings above and  they state understanding. EMR reviewed patient had previously been on amlodipine 5 mg daily by PCP before discontinuing over a year ago.  Will restart patient on her amlodipine 5 mg and have her see PCP for blood pressure recheck and medication management this coming week.  Patient without neurologic deficit, stable gait on exam, no indication for further work-up at this time.  No nystagmus low suspicion for vertigo currently but have encouraged patient to speak about this with her PCP at follow-up visit no indication for further imaging or work-up at this time.  Patient was seen and evaluated by Dr. Ashok Cordia on during this visit who agrees with discharge with amlodipine and PCP follow-up.  At this time there does not appear to be any evidence of an acute emergency medical condition and the patient appears stable for discharge with appropriate outpatient follow up. Diagnosis was discussed with patient who verbalizes understanding of care plan and is agreeable to discharge. I have discussed return precautions with patient who verbalizes understanding. Patient encouraged to follow-up with their PCP. All questions answered.  Note: Portions of this report may have been transcribed using voice recognition software. Every effort was made to ensure accuracy; however, inadvertent computerized transcription errors may still be present. Final Clinical Impression(s) / ED Diagnoses Final diagnoses:  Dizziness  Hypertension, unspecified type    Rx / DC Orders ED Discharge Orders         Ordered    amLODipine  (NORVASC) 5 MG tablet  Daily        12/26/20 1951           Gari Crown 12/26/20 1952    Lajean Saver, MD 12/26/20 2117

## 2020-12-26 NOTE — Discharge Instructions (Addendum)
At this time there does not appear to be the presence of an emergent medical condition, however there is always the potential for conditions to change. Please read and follow the below instructions.  Please return to the Emergency Department immediately for any new or worsening symptoms. Please be sure to follow up with your Primary Care Provider within one week regarding your visit today; please call their office to schedule an appointment even if you are feeling better for a follow-up visit. Please begin taking the blood pressure medication amlodipine as prescribed.  Please see your primary care doctor this week for blood pressure recheck and medication management.   Go to the nearest Emergency Department immediately if: You have fever or chills Get a very bad headache. Start to feel mixed up (confused). Feel weak or numb. You throw up (vomit) or have watery poop (diarrhea), and you cannot eat or drink anything. You have trouble: Talking. Walking. Swallowing. Using your arms, hands, or legs. You feel generally weak. You are not thinking clearly, or you have trouble forming sentences. A friend or family member may notice this. You have: Chest pain. Pain in your belly (abdomen). Shortness of breath. Sweating. Your vision changes. You are bleeding. You have a very bad headache. You have neck pain or a stiff neck. Feel faint. Have very bad pain in your: Chest. Belly (abdomen). Throw up more than once. Have trouble breathing. You have any new/concerning or worsening of symptoms   Please read the additional information packets attached to your discharge summary.  Do not take your medicine if  develop an itchy rash, swelling in your mouth or lips, or difficulty breathing; call 911 and seek immediate emergency medical attention if this occurs.  You may review your lab tests and imaging results in their entirety on your MyChart account.  Please discuss all results of fully with your  primary care provider and other specialist at your follow-up visit.  Note: Portions of this text may have been transcribed using voice recognition software. Every effort was made to ensure accuracy; however, inadvertent computerized transcription errors may still be present.

## 2020-12-26 NOTE — ED Notes (Signed)
Pt on cardiac monitor and auto VS 

## 2020-12-30 ENCOUNTER — Other Ambulatory Visit: Payer: Self-pay

## 2020-12-30 ENCOUNTER — Encounter: Payer: Self-pay | Admitting: Registered Nurse

## 2020-12-30 ENCOUNTER — Encounter: Payer: Medicare HMO | Admitting: Registered Nurse

## 2020-12-30 ENCOUNTER — Ambulatory Visit (INDEPENDENT_AMBULATORY_CARE_PROVIDER_SITE_OTHER): Payer: Medicare HMO | Admitting: Registered Nurse

## 2020-12-30 VITALS — BP 137/71 | HR 83 | Temp 98.0°F | Resp 18 | Ht 64.0 in | Wt 154.6 lb

## 2020-12-30 DIAGNOSIS — Z1329 Encounter for screening for other suspected endocrine disorder: Secondary | ICD-10-CM | POA: Diagnosis not present

## 2020-12-30 DIAGNOSIS — E559 Vitamin D deficiency, unspecified: Secondary | ICD-10-CM | POA: Diagnosis not present

## 2020-12-30 DIAGNOSIS — Z13228 Encounter for screening for other metabolic disorders: Secondary | ICD-10-CM | POA: Diagnosis not present

## 2020-12-30 DIAGNOSIS — Z130 Encounter for screening for diseases of the blood and blood-forming organs and certain disorders involving the immune mechanism: Secondary | ICD-10-CM

## 2020-12-30 DIAGNOSIS — I10 Essential (primary) hypertension: Secondary | ICD-10-CM

## 2020-12-30 DIAGNOSIS — E7800 Pure hypercholesterolemia, unspecified: Secondary | ICD-10-CM

## 2020-12-30 MED ORDER — AMLODIPINE BESYLATE 5 MG PO TABS: 5.0000 mg | ORAL_TABLET | Freq: Every day | ORAL | 3 refills | Status: DC

## 2020-12-30 NOTE — Patient Instructions (Signed)
° ° ° °  If you have lab work done today you will be contacted with your lab results within the next 2 weeks.  If you have not heard from us then please contact us. The fastest way to get your results is to register for My Chart. ° ° °IF you received an x-ray today, you will receive an invoice from [facility]. Please contact [facility] at [phone] with questions or concerns regarding your invoice.  ° °IF you received labwork today, you will receive an invoice from [facility]. Please contact [facility] at [phone] with questions or concerns regarding your invoice.  ° °Our billing staff will not be able to assist you with questions regarding bills from these companies. ° °You will be contacted with the lab results as soon as they are available. The fastest way to get your results is to activate your My Chart account. Instructions are located on the last page of this paperwork. If you have not heard from us regarding the results in 2 weeks, please contact this office. °  ° ° ° °

## 2020-12-31 LAB — CBC WITH DIFFERENTIAL
Basophils Absolute: 0 10*3/uL (ref 0.0–0.2)
Basos: 1 %
EOS (ABSOLUTE): 0.1 10*3/uL (ref 0.0–0.4)
Eos: 2 %
Hematocrit: 37.9 % (ref 34.0–46.6)
Hemoglobin: 13.2 g/dL (ref 11.1–15.9)
Immature Grans (Abs): 0 10*3/uL (ref 0.0–0.1)
Immature Granulocytes: 0 %
Lymphocytes Absolute: 1.9 10*3/uL (ref 0.7–3.1)
Lymphs: 47 %
MCH: 30.1 pg (ref 26.6–33.0)
MCHC: 34.8 g/dL (ref 31.5–35.7)
MCV: 86 fL (ref 79–97)
Monocytes Absolute: 0.4 10*3/uL (ref 0.1–0.9)
Monocytes: 9 %
Neutrophils Absolute: 1.7 10*3/uL (ref 1.4–7.0)
Neutrophils: 41 %
RBC: 4.39 x10E6/uL (ref 3.77–5.28)
RDW: 13.4 % (ref 11.7–15.4)
WBC: 4 10*3/uL (ref 3.4–10.8)

## 2020-12-31 LAB — COMPREHENSIVE METABOLIC PANEL
ALT: 22 IU/L (ref 0–32)
AST: 22 IU/L (ref 0–40)
Albumin/Globulin Ratio: 1.9 (ref 1.2–2.2)
Albumin: 4.2 g/dL (ref 3.8–4.8)
Alkaline Phosphatase: 71 IU/L (ref 44–121)
BUN/Creatinine Ratio: 19 (ref 12–28)
BUN: 15 mg/dL (ref 8–27)
Bilirubin Total: <0.2 mg/dL (ref 0.0–1.2)
CO2: 23 mmol/L (ref 20–29)
Calcium: 9.4 mg/dL (ref 8.7–10.3)
Chloride: 105 mmol/L (ref 96–106)
Creatinine, Ser: 0.81 mg/dL (ref 0.57–1.00)
GFR calc Af Amer: 88 mL/min/{1.73_m2} (ref >59)
GFR calc non Af Amer: 76 mL/min/{1.73_m2} (ref >59)
Globulin, Total: 2.2 g/dL (ref 1.5–4.5)
Glucose: 111 mg/dL — ABNORMAL HIGH (ref 65–99)
Potassium: 4 mmol/L (ref 3.5–5.2)
Sodium: 144 mmol/L (ref 134–144)
Total Protein: 6.4 g/dL (ref 6.0–8.5)

## 2020-12-31 LAB — LIPID PANEL
Chol/HDL Ratio: 2.7 ratio (ref 0.0–4.4)
Cholesterol, Total: 162 mg/dL (ref 100–199)
HDL: 59 mg/dL (ref >39)
LDL Chol Calc (NIH): 91 mg/dL (ref 0–99)
Triglycerides: 63 mg/dL (ref 0–149)
VLDL Cholesterol Cal: 12 mg/dL (ref 5–40)

## 2020-12-31 LAB — VITAMIN D 25 HYDROXY (VIT D DEFICIENCY, FRACTURES): Vit D, 25-Hydroxy: 47.6 ng/mL (ref 30.0–100.0)

## 2020-12-31 LAB — HEMOGLOBIN A1C
Est. average glucose Bld gHb Est-mCnc: 114 mg/dL
Hgb A1c MFr Bld: 5.6 % (ref 4.8–5.6)

## 2021-01-26 ENCOUNTER — Other Ambulatory Visit: Payer: Self-pay

## 2021-01-26 ENCOUNTER — Ambulatory Visit: Payer: Medicare HMO | Admitting: Dermatology

## 2021-01-26 ENCOUNTER — Encounter: Payer: Self-pay | Admitting: Dermatology

## 2021-01-26 DIAGNOSIS — L918 Other hypertrophic disorders of the skin: Secondary | ICD-10-CM

## 2021-01-26 DIAGNOSIS — L821 Other seborrheic keratosis: Secondary | ICD-10-CM | POA: Diagnosis not present

## 2021-01-26 DIAGNOSIS — D485 Neoplasm of uncertain behavior of skin: Secondary | ICD-10-CM | POA: Diagnosis not present

## 2021-01-26 DIAGNOSIS — D1801 Hemangioma of skin and subcutaneous tissue: Secondary | ICD-10-CM | POA: Diagnosis not present

## 2021-01-26 DIAGNOSIS — L110 Acquired keratosis follicularis: Secondary | ICD-10-CM | POA: Diagnosis not present

## 2021-01-26 DIAGNOSIS — L729 Follicular cyst of the skin and subcutaneous tissue, unspecified: Secondary | ICD-10-CM

## 2021-01-26 NOTE — Patient Instructions (Signed)

## 2021-02-01 ENCOUNTER — Ambulatory Visit: Payer: Medicare HMO | Admitting: Dermatology

## 2021-02-13 ENCOUNTER — Encounter: Payer: Self-pay | Admitting: Dermatology

## 2021-02-13 NOTE — Progress Notes (Signed)
   Follow-Up Visit   Subjective  Tiffany Gonzales is a 65 y.o. female who presents for the following: Seborrheic Keratosis (Mid back itches, skin tags under breast ans under arms ).  Change in dark spot on upper back Location:  Duration:  Quality:  Associated Signs/Symptoms: Modifying Factors:  Severity:  Timing: Context:   Objective  Well appearing patient in no apparent distress; mood and affect are within normal limits. Objective  Right Lower Back: 5 mm brown textured flattopped papule; dermoscopy typical of seborrheic keratosis  Objective  Left Axilla, Right Axilla: Under arms three 1 mm pedunculated papules, scissor snipped at patient's request. No waiver per Dr. Denna Haggard   Objective  Right Mid Back: Black 5 mm slightly keratotic papule; dermoscopy not typical of seborrheic keratosis so biopsy will be obtained.     Objective  Right Abdomen (side) - Upper: Left side of abdomen   Objective  Right Breast: Left breast able to leave    All sun exposed areas plus back examined.   Assessment & Plan    Seborrheic keratosis Right Lower Back  No intervention currently indicated  Skin tag (2) Left Axilla; Right Axilla   can return if more skin tags develop.  Neoplasm of uncertain behavior of skin Right Mid Back  Skin / nail biopsy Type of biopsy: tangential   Informed consent: discussed and consent obtained   Timeout: patient name, date of birth, surgical site, and procedure verified   Anesthesia: the lesion was anesthetized in a standard fashion   Anesthetic:  1% lidocaine w/ epinephrine 1-100,000 local infiltration Instrument used: flexible razor blade   Hemostasis achieved with: aluminum chloride and electrodesiccation   Outcome: patient tolerated procedure well   Post-procedure details: wound care instructions given    Specimen 1 - Surgical pathology Differential Diagnosis: seb k  Cautery only Check Margins: No   Cherry angioma Right  Abdomen (side) - Upper  Cyst of skin Right Breast      I, Lavonna Monarch, MD, have reviewed all documentation for this visit.  The documentation on 02/13/21 for the exam, diagnosis, procedures, and orders are all accurate and complete.

## 2021-02-22 ENCOUNTER — Telehealth: Payer: Self-pay | Admitting: Family Medicine

## 2021-02-22 NOTE — Telephone Encounter (Signed)
..  Medication Refills  Last OV:  Medication: Cyzal - generic  Pharmacy:[facility]  Let patient know to contact pharmacy at the end of the day to make sure medication is ready.   Please notify patient to allow 48-72 hours to process.  Encourage patient to contact the pharmacy for refills or they can request refills through [place] out below:   Last refill:  QTY:  Refill Date:    Other Comments:   Okay for refill?  Please advise.

## 2021-02-23 ENCOUNTER — Other Ambulatory Visit: Payer: Self-pay | Admitting: Family Medicine

## 2021-02-23 DIAGNOSIS — J302 Other seasonal allergic rhinitis: Secondary | ICD-10-CM

## 2021-02-23 MED ORDER — LEVOCETIRIZINE DIHYDROCHLORIDE 5 MG PO TABS: 5.0000 mg | ORAL_TABLET | Freq: Every day | ORAL | 11 refills | Status: AC

## 2021-03-08 ENCOUNTER — Encounter: Payer: Medicare HMO | Admitting: Family Medicine

## 2021-06-01 DIAGNOSIS — J3489 Other specified disorders of nose and nasal sinuses: Secondary | ICD-10-CM | POA: Diagnosis not present

## 2021-06-01 DIAGNOSIS — R221 Localized swelling, mass and lump, neck: Secondary | ICD-10-CM | POA: Diagnosis not present

## 2021-06-01 DIAGNOSIS — Z03818 Encounter for observation for suspected exposure to other biological agents ruled out: Secondary | ICD-10-CM | POA: Diagnosis not present

## 2021-06-01 DIAGNOSIS — I10 Essential (primary) hypertension: Secondary | ICD-10-CM | POA: Diagnosis not present

## 2021-06-01 DIAGNOSIS — J309 Allergic rhinitis, unspecified: Secondary | ICD-10-CM | POA: Diagnosis not present

## 2021-06-03 ENCOUNTER — Other Ambulatory Visit: Payer: Self-pay | Admitting: Physician Assistant

## 2021-06-03 DIAGNOSIS — R221 Localized swelling, mass and lump, neck: Secondary | ICD-10-CM

## 2021-06-15 ENCOUNTER — Ambulatory Visit
Admission: RE | Admit: 2021-06-15 | Discharge: 2021-06-15 | Disposition: A | Discharge status: Home / Self Care | Payer: Medicare HMO | Source: Ambulatory Visit | Attending: Physician Assistant | Admitting: Physician Assistant

## 2021-06-15 DIAGNOSIS — R221 Localized swelling, mass and lump, neck: Secondary | ICD-10-CM | POA: Diagnosis not present

## 2021-06-16 ENCOUNTER — Ambulatory Visit (INDEPENDENT_AMBULATORY_CARE_PROVIDER_SITE_OTHER): Payer: Medicare HMO | Admitting: Otolaryngology

## 2021-06-16 ENCOUNTER — Other Ambulatory Visit: Payer: Self-pay

## 2021-06-16 DIAGNOSIS — J310 Chronic rhinitis: Secondary | ICD-10-CM

## 2021-06-16 DIAGNOSIS — H6123 Impacted cerumen, bilateral: Secondary | ICD-10-CM | POA: Diagnosis not present

## 2021-06-16 MED ORDER — TRIAMCINOLONE ACETONIDE 55 MCG/ACT NA AERO: 2.0000 | INHALATION_SPRAY | Freq: Every day | NASAL | 12 refills | Status: AC

## 2021-06-16 NOTE — Progress Notes (Signed)
HPI: Tiffany Gonzales is a 65 y.o. female who presents for evaluation of a left submandibular, left neck mass that she has noticed for couple months now.  It comes and goes.  She had an ultrasound performed recently that demonstrated a normal left submandibular gland in the region of the left neck mass that patient complains of. She also has nasal congestion and has been using Synex regularly.  In addition to Xyzal and Singulair.  She has had previous septal surgery by myself over 15 years ago. She also wanted her ears cleaned as the left ear is partially obstructed.  Past Medical History:  Diagnosis Date   Allergy    seasonal   Allergy    foods-corn, pork, apples, shellfish   Atypical chest pain 01/2007   negative stress test   Colon polyps    Depression    Fibroids    Hyperlipidemia    Hypertension    Panic attacks    Sleep apnea    uses cpap   Past Surgical History:  Procedure Laterality Date   ABDOMINAL HYSTERECTOMY  1998   CATARACT EXTRACTION     COLONOSCOPY  03/30/2010   hyperplastic polyps   NASAL SINUS SURGERY  2007   Dr Mikey Bussing out area to help breath   Social History   Socioeconomic History   Marital status: Divorced    Spouse name: Not on file   Number of children: 2   Years of education: Not on file   Highest education level: Not on file  Occupational History   Occupation: Hydrologist: [employer]  Tobacco Use   Smoking status: Former    Packs/day: 0.30    Years: 34.00    Pack years: 10.20    Types: Cigarettes    Quit date: 08/13/2018    Years since quitting: 2.8   Smokeless tobacco: Never   Tobacco comments:    7 cigs daily 08/20/13  Vaping Use   Vaping Use: Never used  Substance and Sexual Activity   Alcohol use: Yes    Alcohol/week: 1.0 standard drink    Types: 1 Glasses of wine per week    Comment: per day   Drug use: No   Sexual activity: Yes    Birth control/protection: Surgical  Other Topics Concern    Not on file  Social History Narrative   Retired   Divorced   2 children   Social Determinants of Radio broadcast assistant Strain: Not on file  Food Insecurity: Not on file  Transportation Needs: Not on file  Physical Activity: Not on file  Stress: Not on file  Social Connections: Not on file   Family History  Problem Relation Age of Onset   Arthritis Mother    Hypertension Father    Heart disease Maternal Grandmother    Heart disease Paternal Grandmother    Allergies  Allergen Reactions   Iodinated Diagnostic Agents Shortness Of Breath   Lisinopril Hives   Shellfish Allergy    Prior to Admission medications   Medication Sig Start Date End Date Taking? Authorizing Provider  amLODipine (NORVASC) 5 MG tablet Take 1 tablet (5 mg total) by mouth daily. 12/30/20   Maximiano Coss, NP  aspirin 81 MG tablet Take 81 mg by mouth daily.    [provider]  Cholecalciferol 50 MCG (2000 UT) CAPS Take by mouth.    [provider]  FIBER ADULT GUMMIES PO Take by mouth.    [provider]  Ginkgo Biloba 40 MG TABS Take 120 mg by mouth daily.    [provider]  hydrOXYzine (ATARAX/VISTARIL) 10 MG tablet Take 1 tablet (10 mg total) by mouth daily as needed. 09/03/19   Forrest Moron, MD  levocetirizine (XYZAL) 5 MG tablet Take 1 tablet (5 mg total) by mouth daily. 02/23/21   Just, Laurita Quint, FNP  Melatonin 5 MG CAPS Take by mouth.    [provider]  montelukast (SINGULAIR) 10 MG tablet TAKE 1 TABLET BY MOUTH AT BEDTIME. GENERIC EQUIVALENT FOR SINGULAIR 11/05/20   Just, Laurita Quint, FNP  Multiple Vitamins-Minerals (MULTIVITAMIN GUMMIES ADULT PO) Take 2 each by mouth daily.    [provider]  simvastatin (ZOCOR) 20 MG tablet TAKE 1 TABLET EVERY DAY AT 6 PM 11/05/20   Just, Laurita Quint, FNP     Positive ROS: Otherwise negative  All other systems have been reviewed and were otherwise negative with the exception of those mentioned in the HPI  and as above.  Physical Exam: Constitutional: Alert, well-appearing, no acute distress Ears: External ears without lesions or tenderness. Ear canals are small bilaterally with the left ear canal a bit smaller than the right.  She had moderate wax buildup that was cleaned with curettes.  The TMs were clear bilaterally. Nasal: External nose without lesions. Septum is minimally deviated to the right with moderate rhinitis and swollen mucous membranes.  After decongesting the nose the middle meatus regions were clear.  No polyps or intranasal masses noted.  No signs of infection..  Oral: Lips and gums without lesions. Tongue and palate mucosa without lesions. Posterior oropharynx clear.  Tonsil regions are benign in appearance.  Indirect laryngoscopy revealed a clear base of tongue vallecula epiglottis. Neck: No palpable adenopathy or masses.  She has mildly prominent submandibular glands with the left some debris gland in a lower position than normal but no palpable masses or adenopathy on either side of the neck.  No palpable nodules within the left submandibular gland.  On intraoral examination the drainage from the submandibular duct is clear.  There are no palpable stones within the duct. Respiratory: Breathing comfortably  Skin: No facial/neck lesions or rash noted.  Cerumen impaction removal  Date/Time: 06/16/2021 11:43 AM Performed by: Rozetta Nunnery, MD Authorized by: Rozetta Nunnery, MD   Consent:    Consent obtained:  Verbal   Consent given by:  Patient   Risks discussed:  Pain and bleeding Procedure details:    Location:  L ear and R ear   Procedure type: curette   Post-procedure details:    Inspection:  TM intact and canal normal   Hearing quality:  Improved   Procedure completion:  Tolerated well, no immediate complications Comments:     Ear canals were cleaned with curettes bilaterally.  TMs were clear bilaterally.  Assessment: Nasal congestion secondary to  chronic rhinitis. Minimal wax buildup in both ear canals that was cleaned. Intermittent left submandibular gland obstruction.  Questionable etiology.  Reviewed ultrasound and findings with her in the office today.  Plan: Did not recommend any further treatment of the submandibular gland outside of massage and possible use of sialagogues when it swells.  No signs of obstruction today and no signs of tumor. Chronic rhinitis with partial addiction to use of nasal decongestant nasal spray.  I recommended stopping regular use of Synex and limit use to no more than once or twice a week.  Suggested regular use of nasal steroid  spray and prescribed Nasacort 2 sprays each nostril at night.  She has used Flonase in the past but felt like that made her sneeze too much. She will follow-up as needed.  Radene Journey, MD

## 2021-08-16 DIAGNOSIS — Z1231 Encounter for screening mammogram for malignant neoplasm of breast: Secondary | ICD-10-CM | POA: Diagnosis not present

## 2021-08-26 DIAGNOSIS — G4733 Obstructive sleep apnea (adult) (pediatric): Secondary | ICD-10-CM | POA: Diagnosis not present

## 2021-11-11 DIAGNOSIS — G4733 Obstructive sleep apnea (adult) (pediatric): Secondary | ICD-10-CM | POA: Diagnosis not present

## 2021-12-11 NOTE — Progress Notes (Signed)
Established Patient Office Visit  Subjective:  Patient ID: Tiffany Gonzales, female    DOB: 18-Dec-1955  Age: 66 y.o. MRN: [id number]  CC:  Chief Complaint  Patient presents with   Transitions Of Care    Patient states she is here for transfer of care. Per patient she would also like a refill for amlodipine 90 day prescription    HPI Tiffany Gonzales [facility] presents for TOC  No acute concerns Histories reviewed and updated with patient.   Hypertension: Patient Currently taking: amlodipine 5mg  po qd Good effect. No AEs. Denies CV symptoms including: chest pain, shob, doe, headache, visual changes, fatigue, claudication, and dependent edema.   Previous readings and labs: BP Readings from Last 3 Encounters:  12/30/20 137/71  12/26/20 (!) 194/107  11/05/20 138/90   Lab Results  Component Value Date   CREATININE 0.81 12/30/2020    Hx of vitamin d deficiency would like to cehck today.   Past Medical History:  Diagnosis Date   Allergy    seasonal   Allergy    foods-corn, pork, apples, shellfish   Atypical chest pain 01/2007   negative stress test   Colon polyps    Depression    Fibroids    Hyperlipidemia    Hypertension    Panic attacks    Sleep apnea    uses cpap    Past Surgical History:  Procedure Laterality Date   ABDOMINAL HYSTERECTOMY  1998   CATARACT EXTRACTION     COLONOSCOPY  03/30/2010   hyperplastic polyps   NASAL SINUS SURGERY  2007   Dr Mikey Bussing out area to help breath    Family History  Problem Relation Age of Onset   Arthritis Mother    Hypertension Father    Heart disease Maternal Grandmother    Heart disease Paternal Grandmother     Social History   Socioeconomic History   Marital status: Divorced    Spouse name: Not on file   Number of children: 2   Years of education: Not on file   Highest education level: Not on file  Occupational History   Occupation: Hydrologist: [employer]  Tobacco Use    Smoking status: Former    Packs/day: 0.30    Years: 34.00    Pack years: 10.20    Types: Cigarettes    Quit date: 08/13/2018    Years since quitting: 3.3   Smokeless tobacco: Never   Tobacco comments:    7 cigs daily 08/20/13  Vaping Use   Vaping Use: Never used  Substance and Sexual Activity   Alcohol use: Yes    Alcohol/week: 1.0 standard drink    Types: 1 Glasses of wine per week    Comment: per day   Drug use: No   Sexual activity: Yes    Birth control/protection: Surgical  Other Topics Concern   Not on file  Social History Narrative   Retired   Divorced   2 children   Social Determinants of Radio broadcast assistant Strain: Not on file  Food Insecurity: Not on file  Transportation Needs: Not on file  Physical Activity: Not on file  Stress: Not on file  Social Connections: Not on file  Intimate Partner Violence: Not on file    Outpatient Medications Prior to Visit  Medication Sig Dispense Refill   aspirin 81 MG tablet Take 81 mg by mouth daily.     Cholecalciferol 50 MCG (2000 UT) CAPS Take  by mouth.     FIBER ADULT GUMMIES PO Take by mouth.     Ginkgo Biloba 40 MG TABS Take 120 mg by mouth daily.     hydrOXYzine (ATARAX/VISTARIL) 10 MG tablet Take 1 tablet (10 mg total) by mouth daily as needed. 90 tablet 3   Melatonin 5 MG CAPS Take by mouth.     montelukast (SINGULAIR) 10 MG tablet TAKE 1 TABLET BY MOUTH AT BEDTIME. GENERIC EQUIVALENT FOR SINGULAIR 90 tablet 3   Multiple Vitamins-Minerals (MULTIVITAMIN GUMMIES ADULT PO) Take 2 each by mouth daily.     simvastatin (ZOCOR) 20 MG tablet TAKE 1 TABLET EVERY DAY AT 6 PM 90 tablet 3   amLODipine (NORVASC) 5 MG tablet Take 1 tablet (5 mg total) by mouth daily. 30 tablet 0   levocetirizine (XYZAL) 5 MG tablet Take 1 tablet (5 mg total) by mouth daily. 30 tablet 11   No facility-administered medications prior to visit.    Allergies  Allergen Reactions   Iodinated Contrast Media Shortness Of Breath    Lisinopril Hives   Shellfish Allergy     ROS Review of Systems  Constitutional: Negative.   HENT: Negative.    Eyes: Negative.   Respiratory: Negative.    Cardiovascular: Negative.   Gastrointestinal: Negative.   Genitourinary: Negative.   Musculoskeletal: Negative.   Skin: Negative.   Neurological: Negative.   Psychiatric/Behavioral: Negative.    All other systems reviewed and are negative.    Objective:    Physical Exam Vitals and nursing note reviewed.  Constitutional:      General: She is not in acute distress.    Appearance: Normal appearance. She is normal weight. She is not ill-appearing, toxic-appearing or diaphoretic.  Cardiovascular:     Rate and Rhythm: Normal rate and regular rhythm.     Heart sounds: Normal heart sounds. No murmur heard.   No friction rub. No gallop.  Pulmonary:     Effort: Pulmonary effort is normal. No respiratory distress.     Breath sounds: Normal breath sounds. No stridor. No wheezing, rhonchi or rales.  Chest:     Chest wall: No tenderness.  Skin:    General: Skin is warm and dry.  Neurological:     General: No focal deficit present.     Mental Status: She is alert and oriented to person, place, and time. Mental status is at baseline.  Psychiatric:        Mood and Affect: Mood normal.        Behavior: Behavior normal.        Thought Content: Thought content normal.        Judgment: Judgment normal.    BP 137/71    Pulse 83    Temp 98 F (36.7 C) (Temporal)    Resp 18    Ht 5\' 4"  (1.626 m)    Wt 154 lb 9.6 oz (70.1 kg)    SpO2 100%    BMI 26.54 kg/m  Wt Readings from Last 3 Encounters:  12/30/20 154 lb 9.6 oz (70.1 kg)  12/26/20 151 lb (68.5 kg)  11/05/20 151 lb (68.5 kg)     Health Maintenance Due  Topic Date Due   Pneumonia Vaccine 75+ Years old (1 - PCV) Never done   Zoster Vaccines- Shingrix (1 of 2) Never done   DEXA SCAN  Never done   COVID-19 Vaccine (4 - Booster for Pfizer series) 10/25/2020   INFLUENZA VACCINE   06/21/2021    There are  no preventive care reminders to display for this patient.  Lab Results  Component Value Date   TSH 1.340 03/13/2020   Lab Results  Component Value Date   WBC 4.0 12/30/2020   HGB 13.2 12/30/2020   HCT 37.9 12/30/2020   MCV 86 12/30/2020   PLT 248 12/26/2020   Lab Results  Component Value Date   NA 144 12/30/2020   K 4.0 12/30/2020   CO2 23 12/30/2020   GLUCOSE 111 (H) 12/30/2020   BUN 15 12/30/2020   CREATININE 0.81 12/30/2020   BILITOT <0.2 12/30/2020   ALKPHOS 71 12/30/2020   AST 22 12/30/2020   ALT 22 12/30/2020   PROT 6.4 12/30/2020   ALBUMIN 4.2 12/30/2020   CALCIUM 9.4 12/30/2020   ANIONGAP 9 12/26/2020   Lab Results  Component Value Date   CHOL 162 12/30/2020   Lab Results  Component Value Date   HDL 59 12/30/2020   Lab Results  Component Value Date   LDLCALC 91 12/30/2020   Lab Results  Component Value Date   TRIG 63 12/30/2020   Lab Results  Component Value Date   CHOLHDL 2.7 12/30/2020   Lab Results  Component Value Date   HGBA1C 5.6 12/30/2020      Assessment & Plan:   Problem List Items Addressed This Visit       Cardiovascular and Mediastinum   Essential hypertension   Relevant Medications   amLODipine (NORVASC) 5 MG tablet   Other Relevant Orders   CBC With Differential (Completed)   Comprehensive metabolic panel (Completed)   Lipid panel (Completed)     Other   HYPERCHOLESTEROLEMIA - Primary   Relevant Medications   amLODipine (NORVASC) 5 MG tablet   Other Relevant Orders   CBC With Differential (Completed)   Comprehensive metabolic panel (Completed)   Lipid panel (Completed)   Other Visit Diagnoses     Screening for endocrine, metabolic and immunity disorder       Relevant Orders   CBC With Differential (Completed)   Comprehensive metabolic panel (Completed)   Hemoglobin A1c (Completed)   Vitamin D deficiency       Relevant Orders   Vitamin D, 25-hydroxy (Completed)       Meds  ordered this encounter  Medications   DISCONTD: amLODipine (NORVASC) 5 MG tablet    Sig: Take 1 tablet (5 mg total) by mouth daily.    Dispense:  90 tablet    Refill:  3    Order Specific Question:   Supervising Provider    Answer:   Carlota Raspberry, JEFFREY R [[id number]]   amLODipine (NORVASC) 5 MG tablet    Sig: Take 1 tablet (5 mg total) by mouth daily.    Dispense:  90 tablet    Refill:  3    Order Specific Question:   Supervising Provider    Answer:   Carlota Raspberry, JEFFREY R [[id number]]    Follow-up: No follow-ups on file.   PLAN Labs collected. Will follow up with the patient as warranted. Refill amlodipine given good htn control Return for CPE and labs Patient encouraged to call clinic with any questions, comments, or concerns.  Maximiano Coss, NP

## 2021-12-22 DIAGNOSIS — E7800 Pure hypercholesterolemia, unspecified: Secondary | ICD-10-CM | POA: Diagnosis not present

## 2021-12-22 DIAGNOSIS — G4733 Obstructive sleep apnea (adult) (pediatric): Secondary | ICD-10-CM | POA: Diagnosis not present

## 2021-12-22 DIAGNOSIS — I10 Essential (primary) hypertension: Secondary | ICD-10-CM | POA: Diagnosis not present

## 2021-12-22 DIAGNOSIS — J309 Allergic rhinitis, unspecified: Secondary | ICD-10-CM | POA: Diagnosis not present

## 2021-12-22 DIAGNOSIS — Z23 Encounter for immunization: Secondary | ICD-10-CM | POA: Diagnosis not present

## 2021-12-22 DIAGNOSIS — Z0000 Encounter for general adult medical examination without abnormal findings: Secondary | ICD-10-CM | POA: Diagnosis not present

## 2021-12-22 DIAGNOSIS — H9202 Otalgia, left ear: Secondary | ICD-10-CM | POA: Diagnosis not present

## 2022-01-03 DIAGNOSIS — G4733 Obstructive sleep apnea (adult) (pediatric): Secondary | ICD-10-CM | POA: Diagnosis not present

## 2022-01-26 DIAGNOSIS — M25551 Pain in right hip: Secondary | ICD-10-CM | POA: Diagnosis not present

## 2022-02-04 DIAGNOSIS — M25473 Effusion, unspecified ankle: Secondary | ICD-10-CM | POA: Diagnosis not present

## 2022-02-04 DIAGNOSIS — R2243 Localized swelling, mass and lump, lower limb, bilateral: Secondary | ICD-10-CM | POA: Diagnosis not present

## 2022-03-10 DIAGNOSIS — J3089 Other allergic rhinitis: Secondary | ICD-10-CM | POA: Diagnosis not present

## 2022-03-10 DIAGNOSIS — E7800 Pure hypercholesterolemia, unspecified: Secondary | ICD-10-CM | POA: Diagnosis not present

## 2022-03-10 DIAGNOSIS — J309 Allergic rhinitis, unspecified: Secondary | ICD-10-CM | POA: Diagnosis not present

## 2022-03-10 DIAGNOSIS — M5431 Sciatica, right side: Secondary | ICD-10-CM | POA: Diagnosis not present

## 2022-03-10 DIAGNOSIS — I10 Essential (primary) hypertension: Secondary | ICD-10-CM | POA: Diagnosis not present

## 2022-03-10 DIAGNOSIS — R600 Localized edema: Secondary | ICD-10-CM | POA: Diagnosis not present

## 2022-03-14 DIAGNOSIS — M5451 Vertebrogenic low back pain: Secondary | ICD-10-CM | POA: Diagnosis not present

## 2022-03-20 DIAGNOSIS — M5451 Vertebrogenic low back pain: Secondary | ICD-10-CM | POA: Diagnosis not present

## 2022-03-29 DIAGNOSIS — M5451 Vertebrogenic low back pain: Secondary | ICD-10-CM | POA: Diagnosis not present

## 2022-04-07 ENCOUNTER — Emergency Department (HOSPITAL_BASED_OUTPATIENT_CLINIC_OR_DEPARTMENT_OTHER): Payer: Medicare HMO

## 2022-04-07 ENCOUNTER — Encounter (HOSPITAL_BASED_OUTPATIENT_CLINIC_OR_DEPARTMENT_OTHER): Payer: Self-pay

## 2022-04-07 ENCOUNTER — Emergency Department (HOSPITAL_COMMUNITY)
Admission: EM | Admit: 2022-04-07 | Discharge: 2022-04-07 | Discharge status: Against Medical Advice | Payer: Medicare HMO

## 2022-04-07 ENCOUNTER — Emergency Department (HOSPITAL_BASED_OUTPATIENT_CLINIC_OR_DEPARTMENT_OTHER)
Admission: EM | Admit: 2022-04-07 | Discharge: 2022-04-07 | Disposition: A | Discharge status: Home / Self Care | Payer: Medicare HMO | Attending: Emergency Medicine | Admitting: Emergency Medicine

## 2022-04-07 ENCOUNTER — Other Ambulatory Visit: Payer: Self-pay

## 2022-04-07 DIAGNOSIS — R1310 Dysphagia, unspecified: Secondary | ICD-10-CM | POA: Diagnosis not present

## 2022-04-07 DIAGNOSIS — K1120 Sialoadenitis, unspecified: Secondary | ICD-10-CM | POA: Diagnosis not present

## 2022-04-07 DIAGNOSIS — R221 Localized swelling, mass and lump, neck: Secondary | ICD-10-CM | POA: Diagnosis not present

## 2022-04-07 DIAGNOSIS — I10 Essential (primary) hypertension: Secondary | ICD-10-CM | POA: Diagnosis not present

## 2022-04-07 DIAGNOSIS — R599 Enlarged lymph nodes, unspecified: Secondary | ICD-10-CM | POA: Diagnosis not present

## 2022-04-07 DIAGNOSIS — Z7982 Long term (current) use of aspirin: Secondary | ICD-10-CM | POA: Diagnosis not present

## 2022-04-07 DIAGNOSIS — Z79899 Other long term (current) drug therapy: Secondary | ICD-10-CM | POA: Insufficient documentation

## 2022-04-07 LAB — CBC WITH DIFFERENTIAL/PLATELET
Abs Immature Granulocytes: 0.02 10*3/uL (ref 0.00–0.07)
Basophils Absolute: 0 10*3/uL (ref 0.0–0.1)
Basophils Relative: 1 %
Eosinophils Absolute: 0.1 10*3/uL (ref 0.0–0.5)
Eosinophils Relative: 1 %
HCT: 36.8 % (ref 36.0–46.0)
Hemoglobin: 13.3 g/dL (ref 12.0–15.0)
Immature Granulocytes: 0 %
Lymphocytes Relative: 29 %
Lymphs Abs: 2.3 10*3/uL (ref 0.7–4.0)
MCH: 30.2 pg (ref 26.0–34.0)
MCHC: 36.1 g/dL — ABNORMAL HIGH (ref 30.0–36.0)
MCV: 83.4 fL (ref 80.0–100.0)
Monocytes Absolute: 0.4 10*3/uL (ref 0.1–1.0)
Monocytes Relative: 6 %
Neutro Abs: 5 10*3/uL (ref 1.7–7.7)
Neutrophils Relative %: 63 %
Platelets: 261 10*3/uL (ref 150–400)
RBC: 4.41 MIL/uL (ref 3.87–5.11)
RDW: 13.6 % (ref 11.5–15.5)
WBC: 7.9 10*3/uL (ref 4.0–10.5)
nRBC: 0 % (ref 0.0–0.2)

## 2022-04-07 LAB — COMPREHENSIVE METABOLIC PANEL
ALT: 20 U/L (ref 0–44)
AST: 23 U/L (ref 15–41)
Albumin: 4.2 g/dL (ref 3.5–5.0)
Alkaline Phosphatase: 56 U/L (ref 38–126)
Anion gap: 9 (ref 5–15)
BUN: 17 mg/dL (ref 8–23)
CO2: 28 mmol/L (ref 22–32)
Calcium: 9.5 mg/dL (ref 8.9–10.3)
Chloride: 101 mmol/L (ref 98–111)
Creatinine, Ser: 0.85 mg/dL (ref 0.44–1.00)
GFR, Estimated: >60 mL/min (ref >60)
Glucose, Bld: 93 mg/dL (ref 70–99)
Potassium: 3.2 mmol/L — ABNORMAL LOW (ref 3.5–5.1)
Sodium: 138 mmol/L (ref 135–145)
Total Bilirubin: 0.8 mg/dL (ref 0.3–1.2)
Total Protein: 7.6 g/dL (ref 6.5–8.1)

## 2022-04-07 LAB — LACTIC ACID, PLASMA: Lactic Acid, Venous: 0.9 mmol/L (ref 0.5–1.9)

## 2022-04-07 MED ORDER — CLINDAMYCIN HCL 150 MG PO CAPS: 300.0000 mg | ORAL_CAPSULE | Freq: Four times a day (QID) | ORAL | 0 refills | Status: AC

## 2022-04-07 MED ORDER — LACTATED RINGERS IV BOLUS
1000.0000 mL | Freq: Once | INTRAVENOUS | Status: AC
  Administered 2022-04-07: 1000 mL via INTRAVENOUS

## 2022-04-07 MED ORDER — CLINDAMYCIN HCL 150 MG PO CAPS
300.0000 mg | ORAL_CAPSULE | Freq: Once | ORAL | Status: AC
  Administered 2022-04-07: 300 mg via ORAL
  Filled 2022-04-07: qty 2

## 2022-04-07 NOTE — ED Triage Notes (Signed)
Pt reports swelling to left side of neck extending into left cheek associated with pain when swallowing, present for one day. Painful to the touch. She was sent here from [facility] for further evaluation. Denies recent dental pain or procedures. Airway inact, speaking clear complete sentences.

## 2022-04-07 NOTE — ED Provider Notes (Signed)
[place] EMERGENCY DEPARTMENT Provider Note   CSN: [id number] Arrival date & time: 04/07/22  1743     History  Chief Complaint  Patient presents with   Neck Mass    Tiffany Gonzales is a 66 y.o. female.  HPI     66yo female with history of hypertension, hyperlipidemia presents from PCP with concern for left neck swelling beginning today.   Reports swelling started suddenly today to the left submandibular area and under her chin.  Has significant pain.  Feels like it is difficult to swallow, but is able to do so and is not drooling. No dyspnea, no dysphonia.    Denies fevers, n/v, cp.  Does report she feels like her throat is also sore and hurts to swallow.  She denies dental pain-dentist did tell her she needs new crowns 2 weeks ago.  She denies history of salivary gland stones or obstruction.   Past Medical History:  Diagnosis Date   Allergy    seasonal   Allergy    foods-corn, pork, apples, shellfish   Atypical chest pain 01/2007   negative stress test   Colon polyps    Depression    Fibroids    Hyperlipidemia    Hypertension    Panic attacks    Sleep apnea    uses cpap    Home Medications Prior to Admission medications   Medication Sig Start Date End Date Taking? Authorizing Provider  clindamycin (CLEOCIN) 150 MG capsule Take 2 capsules (300 mg total) by mouth 4 (four) times daily for 7 days. 04/07/22 04/14/22 Yes Gareth Morgan, MD  amLODipine (NORVASC) 5 MG tablet Take 1 tablet (5 mg total) by mouth daily. 12/30/20   Maximiano Coss, NP  aspirin 81 MG tablet Take 81 mg by mouth daily.    [provider]  Cholecalciferol 50 MCG (2000 UT) CAPS Take by mouth.    [provider]  FIBER ADULT GUMMIES PO Take by mouth.    [provider]  Ginkgo Biloba 40 MG TABS Take 120 mg by mouth daily.    [provider]  hydrOXYzine (ATARAX/VISTARIL) 10 MG tablet Take 1 tablet (10 mg total) by mouth daily as needed. 09/03/19    Forrest Moron, MD  levocetirizine (XYZAL) 5 MG tablet Take 1 tablet (5 mg total) by mouth daily. 02/23/21   Just, Laurita Quint, FNP  Melatonin 5 MG CAPS Take by mouth.    [provider]  montelukast (SINGULAIR) 10 MG tablet TAKE 1 TABLET BY MOUTH AT BEDTIME. GENERIC EQUIVALENT FOR SINGULAIR 11/05/20   Just, Laurita Quint, FNP  Multiple Vitamins-Minerals (MULTIVITAMIN GUMMIES ADULT PO) Take 2 each by mouth daily.    [provider]  simvastatin (ZOCOR) 20 MG tablet TAKE 1 TABLET EVERY DAY AT 6 PM 11/05/20   Just, Laurita Quint, FNP  triamcinolone (NASACORT) 55 MCG/ACT AERO nasal inhaler Place 2 sprays into the nose daily. 2 sprays each nostril at night 06/16/21   Rozetta Nunnery, MD      Allergies    Iodinated contrast media, Lisinopril, and Shellfish allergy    Review of Systems   Review of Systems  Physical Exam Updated Vital Signs BP 124/77 (BP Location: Left Arm)   Pulse 65   Temp 98.9 F (37.2 C) (Oral)   Resp 16   Ht '5\' 4"'$  (1.626 m)   Wt 74 kg   SpO2 98%   BMI 28.01 kg/m  Physical Exam Vitals and nursing note reviewed.  Constitutional:      General: She is not in acute distress.    Appearance: She is well-developed. She is not diaphoretic.  HENT:     Head: Normocephalic and atraumatic.     Mouth/Throat:     Comments: Mild sublingual swelling, purulent drainage noted from Wharton's duct Significant left submandibular swelling 6cm area, very tender, not erythematous Mild submental swelling Eyes:     Conjunctiva/sclera: Conjunctivae normal.  Cardiovascular:     Rate and Rhythm: Normal rate and regular rhythm.     Heart sounds: Normal heart sounds. No murmur heard.   No friction rub. No gallop.  Pulmonary:     Effort: Pulmonary effort is normal. No respiratory distress.     Breath sounds: Normal breath sounds. No wheezing or rales.  Abdominal:     General: There is no distension.     Palpations: Abdomen is soft.     Tenderness: There is no abdominal  tenderness. There is no guarding.  Musculoskeletal:        General: No tenderness.     Cervical back: Normal range of motion.  Skin:    General: Skin is warm and dry.     Findings: No erythema or rash.  Neurological:     Mental Status: She is alert and oriented to person, place, and time.    ED Results / Procedures / Treatments   Labs (all labs ordered are listed, but only abnormal results are displayed) Labs Reviewed  CBC WITH DIFFERENTIAL/PLATELET - Abnormal; Notable for the following components:      Result Value   MCHC 36.1 (*)    All other components within normal limits  COMPREHENSIVE METABOLIC PANEL - Abnormal; Notable for the following components:   Potassium 3.2 (*)    All other components within normal limits  LACTIC ACID, PLASMA    EKG None  Radiology CT Soft Tissue Neck Wo Contrast  Result Date: 04/07/2022 CLINICAL DATA:  Neck mass EXAM: CT NECK WITHOUT CONTRAST TECHNIQUE: Multidetector CT imaging of the neck was performed following the standard protocol without intravenous contrast. RADIATION DOSE REDUCTION: This exam was performed according to the departmental dose-optimization program which includes automated exposure control, adjustment of the mA and/or kV according to patient size and/or use of iterative reconstruction technique. COMPARISON:  None Available. FINDINGS: PHARYNX AND LARYNX: The nasopharynx, oropharynx and larynx are normal. Visible portions of the oral cavity, tongue base and floor of mouth are normal. Normal epiglottis, vallecula and pyriform sinuses. The larynx is normal. No retropharyngeal abscess, effusion or lymphadenopathy. SALIVARY GLANDS: The left submandibular gland is closest structured to the marker placed for the palpable abnormality. Gland is mildly enlarged but without focal abnormality. THYROID: Normal. LYMPH NODES: No enlarged or abnormal density lymph nodes. VASCULAR: Unremarkable LIMITED INTRACRANIAL: Normal. VISUALIZED ORBITS: Normal.  MASTOIDS AND VISUALIZED PARANASAL SINUSES: No fluid levels or advanced mucosal thickening. No mastoid effusion. SKELETON: No bony spinal canal stenosis. No lytic or blastic lesions. UPPER CHEST: Clear. OTHER: None. IMPRESSION: The mildly enlarged left submandibular gland is the closest structure to the marker placed for the palpable abnormality, but there is no focal abnormality. Electronically Signed   By: Ulyses Jarred M.D.   On: 04/07/2022 21:32    Procedures Procedures    Medications Ordered in ED Medications  lactated ringers bolus 1,000 mL ( Intravenous Stopped 04/07/22 2251)  clindamycin (CLEOCIN) capsule 300 mg (300 mg Oral Given 04/07/22 2338)    ED Course/ Medical Decision Making/ A&P  Medical Decision Making Amount and/or Complexity of Data Reviewed Labs: ordered. Radiology: ordered.  Risk Prescription drug management.   66yo female with history of hypertension, hyperlipidemia presents from PCP with concern for left neck swelling beginning today.  DDx includes sialoadenitis, lymphadenopathy, lymphadenitis, abscess, ludwig's angina, submanidbular gland abscess, sialolithiasis.   Labs obtained and personally interpreted by me show no evidence of leukocytosis, no other significant electrolyte abnormalities.  CT noncontrast ordered given contrast allergy personally reviewed and interpreted by me shows submandibular gland swelling.   Exam significant for swelling submandibular gland, with some submental and mild sublingual swelling and purulence expressed from Wharton's duct.  No fever, clinically not consistent with Ludwig's angina.  Concern for submandibular gland infection given swelling and purulence noted from whartons duct.  Discussed with Dr. Constance Holster ENT who recommends clindamycin and follow up in office tomorrow. No sign of airway compromise, she is able to tolerate po, not drooling, and feels improved after being in the ED and is comfortable with this  plan with understanding of strict return precautions.  Patient discharged in stable condition with understanding of reasons to return.          Final Clinical Impression(s) / ED Diagnoses Final diagnoses:  Sialoadenitis of submandibular gland    Rx / DC Orders ED Discharge Orders          Ordered    clindamycin (CLEOCIN) 150 MG capsule  4 times daily        04/07/22 2329              Gareth Morgan, MD 04/08/22 1108

## 2022-04-08 DIAGNOSIS — K1120 Sialoadenitis, unspecified: Secondary | ICD-10-CM | POA: Diagnosis not present

## 2022-04-19 DIAGNOSIS — K1120 Sialoadenitis, unspecified: Secondary | ICD-10-CM | POA: Diagnosis not present

## 2022-04-19 DIAGNOSIS — I10 Essential (primary) hypertension: Secondary | ICD-10-CM | POA: Diagnosis not present

## 2022-04-19 DIAGNOSIS — R197 Diarrhea, unspecified: Secondary | ICD-10-CM | POA: Diagnosis not present

## 2022-04-20 DIAGNOSIS — R197 Diarrhea, unspecified: Secondary | ICD-10-CM | POA: Diagnosis not present

## 2022-04-29 DIAGNOSIS — M5451 Vertebrogenic low back pain: Secondary | ICD-10-CM | POA: Diagnosis not present

## 2022-05-10 DIAGNOSIS — M5451 Vertebrogenic low back pain: Secondary | ICD-10-CM | POA: Diagnosis not present

## 2022-05-12 DIAGNOSIS — M5451 Vertebrogenic low back pain: Secondary | ICD-10-CM | POA: Diagnosis not present

## 2022-06-16 DIAGNOSIS — I10 Essential (primary) hypertension: Secondary | ICD-10-CM | POA: Diagnosis not present

## 2022-08-03 DIAGNOSIS — H5213 Myopia, bilateral: Secondary | ICD-10-CM | POA: Diagnosis not present

## 2022-08-03 DIAGNOSIS — H52223 Regular astigmatism, bilateral: Secondary | ICD-10-CM | POA: Diagnosis not present

## 2022-08-03 DIAGNOSIS — H524 Presbyopia: Secondary | ICD-10-CM | POA: Diagnosis not present

## 2022-08-03 DIAGNOSIS — Z961 Presence of intraocular lens: Secondary | ICD-10-CM | POA: Diagnosis not present

## 2022-08-03 DIAGNOSIS — I10 Essential (primary) hypertension: Secondary | ICD-10-CM | POA: Diagnosis not present

## 2022-08-18 DIAGNOSIS — Z1231 Encounter for screening mammogram for malignant neoplasm of breast: Secondary | ICD-10-CM | POA: Diagnosis not present

## 2022-11-03 DIAGNOSIS — G4733 Obstructive sleep apnea (adult) (pediatric): Secondary | ICD-10-CM | POA: Diagnosis not present

## 2022-12-29 DIAGNOSIS — I10 Essential (primary) hypertension: Secondary | ICD-10-CM | POA: Diagnosis not present

## 2022-12-29 DIAGNOSIS — N760 Acute vaginitis: Secondary | ICD-10-CM | POA: Diagnosis not present

## 2022-12-29 DIAGNOSIS — Z122 Encounter for screening for malignant neoplasm of respiratory organs: Secondary | ICD-10-CM | POA: Diagnosis not present

## 2022-12-29 DIAGNOSIS — R197 Diarrhea, unspecified: Secondary | ICD-10-CM | POA: Diagnosis not present

## 2022-12-29 DIAGNOSIS — E7800 Pure hypercholesterolemia, unspecified: Secondary | ICD-10-CM | POA: Diagnosis not present

## 2022-12-29 DIAGNOSIS — Z780 Asymptomatic menopausal state: Secondary | ICD-10-CM | POA: Diagnosis not present

## 2022-12-29 DIAGNOSIS — J3089 Other allergic rhinitis: Secondary | ICD-10-CM | POA: Diagnosis not present

## 2022-12-29 DIAGNOSIS — R350 Frequency of micturition: Secondary | ICD-10-CM | POA: Diagnosis not present

## 2022-12-29 DIAGNOSIS — J309 Allergic rhinitis, unspecified: Secondary | ICD-10-CM | POA: Diagnosis not present

## 2022-12-29 DIAGNOSIS — Z0000 Encounter for general adult medical examination without abnormal findings: Secondary | ICD-10-CM | POA: Diagnosis not present

## 2022-12-30 ENCOUNTER — Other Ambulatory Visit: Payer: Self-pay | Admitting: Family Medicine

## 2022-12-30 DIAGNOSIS — Z122 Encounter for screening for malignant neoplasm of respiratory organs: Secondary | ICD-10-CM

## 2022-12-30 DIAGNOSIS — Z780 Asymptomatic menopausal state: Secondary | ICD-10-CM

## 2022-12-30 DIAGNOSIS — R197 Diarrhea, unspecified: Secondary | ICD-10-CM | POA: Diagnosis not present

## 2023-01-10 ENCOUNTER — Ambulatory Visit
Admission: RE | Admit: 2023-01-10 | Discharge: 2023-01-10 | Disposition: A | Discharge status: Home / Self Care | Payer: Medicare HMO | Source: Ambulatory Visit | Attending: Family Medicine | Admitting: Family Medicine

## 2023-01-10 DIAGNOSIS — M85852 Other specified disorders of bone density and structure, left thigh: Secondary | ICD-10-CM | POA: Diagnosis not present

## 2023-01-10 DIAGNOSIS — Z780 Asymptomatic menopausal state: Secondary | ICD-10-CM

## 2023-01-27 ENCOUNTER — Ambulatory Visit
Admission: RE | Admit: 2023-01-27 | Discharge: 2023-01-27 | Disposition: A | Discharge status: Home / Self Care | Payer: Medicare HMO | Source: Ambulatory Visit | Attending: Family Medicine | Admitting: Family Medicine

## 2023-01-27 DIAGNOSIS — Z122 Encounter for screening for malignant neoplasm of respiratory organs: Secondary | ICD-10-CM

## 2023-02-01 ENCOUNTER — Ambulatory Visit: Payer: Medicare HMO | Admitting: Physician Assistant

## 2023-04-24 ENCOUNTER — Ambulatory Visit: Payer: Medicare HMO | Admitting: Physician Assistant

## 2023-04-24 ENCOUNTER — Encounter: Payer: Self-pay | Admitting: Physician Assistant

## 2023-04-24 VITALS — BP 124/76 | HR 79 | Ht 64.0 in | Wt 163.0 lb

## 2023-04-24 DIAGNOSIS — R194 Change in bowel habit: Secondary | ICD-10-CM

## 2023-04-24 NOTE — Patient Instructions (Signed)
Start over the [employer] daily.   Follow up with Hyacinth Meeker, PA on 07/19/23 at 3:00pm.  The [facility] providers would like to encourage you to use [facility] to communicate with providers for non-urgent requests or questions.  Due to long hold times on the telephone, sending your provider a message by [facility] may be a faster and more efficient way to get a response.  Please allow 48 business hours for a response.  Please remember that this is for non-urgent requests.

## 2023-04-24 NOTE — Progress Notes (Signed)
Chief Complaint: Change in bowel habits  HPI:    Tiffany Gonzales is a 67 year old female with a past medical history as listed below including depression and hypertension, known to Dr. Lavon Paganini, who was referred to me by Aliene Beams, MD for a complaint of change in bowel habits.    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  Repeat recommended in 10 years.    04/07/2022 CBC normal.    Today, the patient tells me that after time of her colonoscopy she started a fiber supplement per recommendations given findings of diverticulosis.  It worked for a while and she did well but then she started noticing some leakage of stool in her underwear and started to have to wear a pad given that she can never predict this.  She then stopped the fiber and started a prebiotic which worked for a little while but then did not work at all stopped this and started a probiotic about a month ago and since then has remained somewhat constipated with a bowel movement every other day and it is hard to go and after she goes she has some itching and burning.  She has been using hemorrhoid wipes which helps with this discomfort.  She is uncertain what to do next.    Denies fever, chills, weight loss, blood in her stool, nausea or vomiting.  Past Medical History:  Diagnosis Date   Allergy    seasonal   Allergy    foods-corn, pork, apples, shellfish   Atypical chest pain 01/2007   negative stress test   Colon polyps    Depression    Fibroids    Hyperlipidemia    Hypertension    Panic attacks    Sleep apnea    uses cpap    Past Surgical History:  Procedure Laterality Date   ABDOMINAL HYSTERECTOMY  1998   CATARACT EXTRACTION     COLONOSCOPY  03/30/2010   hyperplastic polyps   NASAL SINUS SURGERY  2007   Dr Esperanza Richters out area to help breath     Current Outpatient Medications  Medication Sig Dispense Refill   amLODipine (NORVASC) 5 MG tablet Take 1 tablet (5 mg total) by mouth daily. 90 tablet 3   aspirin 81 MG tablet Take 81 mg by mouth daily.     Cholecalciferol 50 MCG (2000 UT) CAPS Take by mouth.     FIBER ADULT GUMMIES PO Take by mouth.     Ginkgo Biloba 40 MG TABS Take 120 mg by mouth daily.     hydrOXYzine (ATARAX/VISTARIL) 10 MG tablet Take 1 tablet (10 mg total) by mouth daily as needed. 90 tablet 3   levocetirizine (XYZAL) 5 MG tablet Take 1 tablet (5 mg total) by mouth daily. 30 tablet 11   Melatonin 5 MG CAPS Take by mouth.     montelukast (SINGULAIR) 10 MG tablet TAKE 1 TABLET BY MOUTH AT BEDTIME. GENERIC EQUIVALENT FOR SINGULAIR 90 tablet 3   Multiple Vitamins-Minerals (MULTIVITAMIN GUMMIES ADULT PO) Take 2 each by mouth daily.     simvastatin (ZOCOR) 20 MG tablet TAKE 1 TABLET EVERY DAY AT 6 PM 90 tablet 3   triamcinolone (NASACORT) 55 MCG/ACT AERO nasal inhaler Place 2 sprays into the nose daily. 2 sprays each nostril at night 1 each 12   No current facility-administered medications for this visit.    Allergies as of 04/24/2023 -  Review Complete 04/07/2022  Allergen Reaction Noted   Iodinated contrast media Shortness Of Breath 05/24/2012   Lisinopril Hives 10/09/2012   Shellfish allergy  10/15/2016    Family History  Problem Relation Age of Onset   Arthritis Mother    Hypertension Father    Heart disease Maternal Grandmother    Heart disease Paternal Grandmother     Social History   Socioeconomic History   Marital status: Divorced    Spouse name: Not on file   Number of children: 2   Years of education: Not on file   Highest education level: Not on file  Occupational History   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[employer]  Tobacco Use   Smoking status: Former    Packs/day: 0.30    Years: 34.00    Additional pack years: 0.00    Total pack years: 10.20    Types: Cigarettes     Quit date: 08/13/2018    Years since quitting: 4.6   Smokeless tobacco: Never   Tobacco comments:    7 cigs daily 08/20/13  Vaping Use   Vaping Use: Never used  Substance and Sexual Activity   Alcohol use: Yes    Alcohol/week: 1.0 standard drink of alcohol    Types: 1 Glasses of wine per week    Comment: per day   Drug use: No   Sexual activity: Yes    Birth control/protection: Surgical  Other Topics Concern   Not on file  Social History Narrative   Retired   Divorced   2 children   Social Determinants of Corporate investment banker Strain: Not on file  Food Insecurity: Not on file  Transportation Needs: Not on file  Physical Activity: Not on file  Stress: Not on file  Social Connections: Not on file  Intimate Partner Violence: Not on file    Review of Systems:    Constitutional: No weight loss, fever or chills Cardiovascular: No chest pain Respiratory: No SOB  Gastrointestinal: See HPI and otherwise negative   Physical Exam:  Vital signs: BP 124/76   Pulse 79   Ht 5\' 4"  (1.626 m)   Wt 163 lb (73.9 kg)   BMI 27.98 kg/m    Constitutional:   Pleasant AA female appears to be in NAD, Well developed, Well nourished, alert and cooperative Respiratory: Respirations even and unlabored. Lungs clear to auscultation bilaterally.   No wheezes, crackles, or rhonchi.  Cardiovascular: Normal S1, S2. No MRG. Regular rate and rhythm. No peripheral edema, cyanosis or pallor.  Gastrointestinal:  Soft, nondistended, nontender. No rebound or guarding. Normal bowel sounds. No appreciable masses or hepatomegaly. Rectal:  Not performed.  Psychiatric:  Demonstrates good judgement and reason without abnormal affect or behaviors.  RELEVANT LABS AND IMAGING: CBC    Component Value Date/Time   WBC 7.9 04/07/2022 2123   RBC 4.41 04/07/2022 2123   HGB 13.3 04/07/2022 2123   HGB 13.2 12/30/2020 1443   HCT 36.8 04/07/2022 2123   HCT 37.9 12/30/2020 1443   PLT 261 04/07/2022 2123    PLT 229 03/13/2020 1208   MCV 83.4 04/07/2022 2123   MCV 86 12/30/2020 1443   MCH 30.2 04/07/2022 2123   MCHC 36.1 (H) 04/07/2022 2123   RDW 13.6 04/07/2022 2123   RDW 13.4 12/30/2020 1443   LYMPHSABS 2.3 04/07/2022 2123   LYMPHSABS 1.9 12/30/2020 1443   MONOABS 0.4 04/07/2022 2123   EOSABS 0.1 04/07/2022 2123   EOSABS 0.1 12/30/2020  1443   BASOSABS 0.0 04/07/2022 2123   BASOSABS 0.0 12/30/2020 1443    CMP     Component Value Date/Time   NA 138 04/07/2022 2123   NA 144 12/30/2020 1443   K 3.2 (L) 04/07/2022 2123   CL 101 04/07/2022 2123   CO2 28 04/07/2022 2123   GLUCOSE 93 04/07/2022 2123   BUN 17 04/07/2022 2123   BUN 15 12/30/2020 1443   CREATININE 0.85 04/07/2022 2123   CREATININE 0.96 02/16/2016 1601   CALCIUM 9.5 04/07/2022 2123   PROT 7.6 04/07/2022 2123   PROT 6.4 12/30/2020 1443   ALBUMIN 4.2 04/07/2022 2123   ALBUMIN 4.2 12/30/2020 1443   AST 23 04/07/2022 2123   ALT 20 04/07/2022 2123   ALKPHOS 56 04/07/2022 2123   BILITOT 0.8 04/07/2022 2123   BILITOT <0.2 12/30/2020 1443   GFRNONAA >60 04/07/2022 2123   GFRNONAA >89 09/03/2012 0950   GFRAA 88 12/30/2020 1443   GFRAA >89 09/03/2012 0950    Assessment: 1.  Change in bowel habits: Initially fecal leakage with fiber supplementation (query overflow constipation), then constipation with a bowel movement every other day and some hemorrhoidal itching over the past month, up-to-date on colonoscopy with the last 06/10/2020 with diverticulosis and repeat recommended in 10 years  Plan: 1.  Discussed with patient that I am suspicious that she likely had constipation with overflow giving her leakage.  Would recommend that she stop all of the pre-/probiotics and go back to fiber supplementation.  Recommended Benefiber 1 dose daily for the next week, if she is having issues with this then would increase this to twice daily dosage.  Recommended goal of 25-35 g of fiber a day through her fiber supplement and diet. 2.   Patient was scheduled for follow-up in 2-3 months with me.  She will call back before then if she is having any problems.  Hyacinth Meeker, PA-C [facility] 04/24/2023, 2:22 PM  Cc: Aliene Beams, MD

## 2023-06-13 ENCOUNTER — Other Ambulatory Visit: Payer: Self-pay | Admitting: Physician Assistant

## 2023-06-13 DIAGNOSIS — R194 Change in bowel habit: Secondary | ICD-10-CM

## 2023-07-04 ENCOUNTER — Telehealth: Payer: Self-pay | Admitting: Family Medicine

## 2023-07-04 ENCOUNTER — Ambulatory Visit
Admission: RE | Admit: 2023-07-04 | Discharge: 2023-07-04 | Disposition: A | Discharge status: Home / Self Care | Payer: Medicare HMO | Source: Ambulatory Visit | Attending: Physician Assistant | Admitting: Physician Assistant

## 2023-07-04 ENCOUNTER — Other Ambulatory Visit: Payer: Self-pay

## 2023-07-04 DIAGNOSIS — R194 Change in bowel habit: Secondary | ICD-10-CM

## 2023-07-04 NOTE — Telephone Encounter (Signed)
Patient notified Efraim Kaufmann did see her on 2013 for ed follow up on an allergic reaction. The iodine contrast allergy was already there. Not sure who or when it was entered.

## 2023-07-04 NOTE — Telephone Encounter (Signed)
Tiffany Gonzales called to request the removal of an allergy from her chart that was diagnosed by [facility] when she began her care in 2013. Please call to inform Tiffany Gonzales.

## 2023-07-06 ENCOUNTER — Telehealth: Payer: Self-pay

## 2023-07-06 MED ORDER — PREDNISONE 50 MG PO TABS: ORAL_TABLET | ORAL | 0 refills | Status: DC

## 2023-07-06 NOTE — Telephone Encounter (Signed)
Phone call to patient to review instructions for 13 hr prep for CT w/ contrast on 07/10/23  at 10:20AM. Prescription called into [facility]. Pt aware and verbalized understanding of instructions. Prescription: Pt to take 50 mg of prednisone on 07/09/23 at 9:20pm, 50 mg of prednisone on 07/10/23 at 3:30AM, and 50 mg of prednisone on 07/10/23 at 9:20AM. Pt is also to take 50 mg of benadryl on 07/10/23 at 9:20AM. Please call [phone] with any questions.

## 2023-07-10 ENCOUNTER — Ambulatory Visit
Admission: RE | Admit: 2023-07-10 | Discharge: 2023-07-10 | Disposition: A | Discharge status: Home / Self Care | Payer: Medicare HMO | Source: Ambulatory Visit | Attending: Physician Assistant | Admitting: Physician Assistant

## 2023-07-10 MED ORDER — IOPAMIDOL (ISOVUE-300) INJECTION 61%
100.0000 mL | Freq: Once | INTRAVENOUS | Status: AC | PRN
  Administered 2023-07-10: 100 mL via INTRAVENOUS

## 2023-07-19 ENCOUNTER — Encounter: Payer: Self-pay | Admitting: Physician Assistant

## 2023-07-19 ENCOUNTER — Ambulatory Visit: Payer: Medicare HMO | Admitting: Physician Assistant

## 2023-07-19 VITALS — BP 92/66 | HR 76 | Ht 62.0 in | Wt 162.4 lb

## 2023-07-19 DIAGNOSIS — K5900 Constipation, unspecified: Secondary | ICD-10-CM

## 2023-07-19 NOTE — Patient Instructions (Signed)
Start Miralax daily.  If your blood pressure at your visit was 140/90 or greater, please contact your primary care physician to follow up on this.  _______________________________________________________  If you are age 69 or older, your body mass index should be between 23-30. Your Body mass index is 29.7 kg/m. If this is out of the aforementioned range listed, please consider follow up with your Primary Care Provider.  If you are age 47 or younger, your body mass index should be between 19-25. Your Body mass index is 29.7 kg/m. If this is out of the aformentioned range listed, please consider follow up with your Primary Care Provider.   ________________________________________________________  The [facility] providers would like to encourage you to use [facility] to communicate with providers for non-urgent requests or questions.  Due to long hold times on the telephone, sending your provider a message by [facility] may be a faster and more efficient way to get a response.  Please allow 48 business hours for a response.  Please remember that this is for non-urgent requests.   It was a pleasure to see you today!  Thank you for trusting me with your gastrointestinal care!    Hyacinth Meeker, PA-C

## 2023-07-19 NOTE — Progress Notes (Signed)
Chief Complaint: Follow-up change in bowel habits  HPI:    Tiffany Gonzales is a 67 year old female with a past medical history as listed below including depression and hypertension, known to Dr. Lavon Paganini, who returns to clinic today for follow-up of her change in bowel habits.    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 Repeat recommended in 10 years.     04/24/2023 patient seen in clinic and at that time discussed that after time of her colonoscopy she is start a fiber supplement and it worked for a while but then she started noticing some leakage of her stool in her underwear and she had to wear a pad.  Continued to be somewhat constipated.  At that time recommended she stop all pre-/probiotics and go back to fiber supplementation 1 dose daily for the next week and then increase to twice daily dosage.    07/17/2023 CTAP with contrast for altered bowel habits with constipation and diverticulosis.    Today, the patient tells me that she has not been having any further fecal leakage.  She did increase her fiber supplement up to 4 times a day and has not noticed any change in her bowel habits, in fact can still go 3 to 4 days without a bowel movement and has some discomfort and bloating.    Denies fever, chills, blood in her stool or weight loss.  Past Medical History:  Diagnosis Date   Allergy    seasonal   Allergy    foods-corn, pork, apples, shellfish   Atypical chest pain 01/2007   negative stress test   Colon polyps    Depression    Fibroids    Hyperlipidemia    Hypertension    Panic attacks    Sleep apnea    uses cpap    Past Surgical History:  Procedure Laterality Date   ABDOMINAL HYSTERECTOMY  1998   CATARACT EXTRACTION     COLONOSCOPY  03/30/2010   hyperplastic polyps   NASAL SINUS SURGERY  2007   Dr Esperanza Richters out  area to help breath    Current Outpatient Medications  Medication Sig Dispense Refill   amLODipine (NORVASC) 5 MG tablet Take 1 tablet (5 mg total) by mouth daily. 90 tablet 3   aspirin 81 MG tablet Take 81 mg by mouth daily.     Cholecalciferol 50 MCG (2000 UT) CAPS Take by mouth.     FIBER ADULT GUMMIES PO Take by mouth.     Ginkgo Biloba 40 MG TABS Take 120 mg by mouth daily.     hydrOXYzine (ATARAX/VISTARIL) 10 MG tablet Take 1 tablet (10 mg total) by mouth daily as needed. 90 tablet 3   levocetirizine (XYZAL) 5 MG tablet Take 1 tablet (5 mg total) by mouth daily. 30 tablet 11   Melatonin 5 MG CAPS Take by mouth.     montelukast (SINGULAIR) 10 MG tablet TAKE 1 TABLET BY MOUTH AT BEDTIME. GENERIC EQUIVALENT FOR SINGULAIR 90 tablet 3   Multiple Vitamins-Minerals (MULTIVITAMIN GUMMIES ADULT PO) Take 2 each by mouth daily.     predniSONE (DELTASONE) 50 MG tablet Pt to take 50 mg of prednisone on 07/09/23 at 9:20pm, 50 mg of prednisone on 07/10/23 at 3:30AM, and 50 mg of prednisone on 07/10/23 at 9:20AM. Pt is also to take 50 mg of benadryl on 07/10/23 at 9:20AM. Please call [phone] with any questions.  3 tablet 0   simvastatin (ZOCOR) 20 MG tablet TAKE 1 TABLET EVERY DAY AT 6 PM 90 tablet 3   triamcinolone (NASACORT) 55 MCG/ACT AERO nasal inhaler Place 2 sprays into the nose daily. 2 sprays each nostril at night 1 each 12   No current facility-administered medications for this visit.    Allergies as of 07/19/2023 - Review Complete 07/10/2023  Allergen Reaction Noted   Iodinated contrast media Shortness Of Breath and Other (See Comments) 05/24/2012   Lisinopril Hives 10/09/2012   Shellfish allergy  10/15/2016    Family History  Problem Relation Age of Onset   Arthritis Mother    Hypertension Father    Heart disease Maternal Grandmother    Heart disease Paternal Grandmother     Social History   Socioeconomic History   Marital status: Divorced    Spouse name: Not on file    Number of children: 2   Years of education: Not on file   Highest education level: Not on file  Occupational History   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[employer]  Tobacco Use   Smoking status: Former    Current packs/day: 0.00    Average packs/day: 0.3 packs/day for 34.0 years (10.2 ttl pk-yrs)    Types: Cigarettes    Start date: 08/13/1984    Quit date: 08/13/2018    Years since quitting: 4.9   Smokeless tobacco: Never   Tobacco comments:    7 cigs daily 08/20/13  Vaping Use   Vaping status: Never Used  Substance and Sexual Activity   Alcohol use: Yes    Alcohol/week: 1.0 standard drink of alcohol    Types: 1 Glasses of wine per week    Comment: per day   Drug use: No   Sexual activity: Yes    Birth control/protection: Surgical  Other Topics Concern   Not on file  Social History Narrative   Retired   Divorced   2 children   Social Determinants of Corporate investment banker Strain: Not on file  Food Insecurity: Not on file  Transportation Needs: Not on file  Physical Activity: Not on file  Stress: Not on file  Social Connections: Not on file  Intimate Partner Violence: Not on file    Review of Systems:    Constitutional: No weight loss, fever or chills Cardiovascular: No chest pain   Respiratory: No SOB  Gastrointestinal: See HPI and otherwise negative   Physical Exam:  Vital signs: BP 92/66   Pulse 76   Ht 5\' 2"  (1.575 m)   Wt 162 lb 6.4 oz (73.7 kg)   BMI 29.70 kg/m    Constitutional:   Pleasant AA female appears to be in NAD, Well developed, Well nourished, alert and cooperative Respiratory: Respirations even and unlabored. Lungs clear to auscultation bilaterally.   No wheezes, crackles, or rhonchi.  Cardiovascular: Normal S1, S2. No MRG. Regular rate and rhythm. No peripheral edema, cyanosis or pallor.  Gastrointestinal:  Soft, nondistended, nontender. No rebound or guarding. Decreased BS all four quadrants, No appreciable masses or  hepatomegaly. Rectal:  Not performed.  Psychiatric: Demonstrates good judgement and reason without abnormal affect or behaviors.  RELEVANT LABS AND IMAGING: CBC    Component Value Date/Time   WBC 7.9 04/07/2022 2123   RBC 4.41 04/07/2022 2123   HGB 13.3 04/07/2022 2123   HGB 13.2 12/30/2020 1443   HCT 36.8 04/07/2022 2123   HCT 37.9 12/30/2020 1443   PLT 261 04/07/2022  2123   PLT 229 03/13/2020 1208   MCV 83.4 04/07/2022 2123   MCV 86 12/30/2020 1443   MCH 30.2 04/07/2022 2123   MCHC 36.1 (H) 04/07/2022 2123   RDW 13.6 04/07/2022 2123   RDW 13.4 12/30/2020 1443   LYMPHSABS 2.3 04/07/2022 2123   LYMPHSABS 1.9 12/30/2020 1443   MONOABS 0.4 04/07/2022 2123   EOSABS 0.1 04/07/2022 2123   EOSABS 0.1 12/30/2020 1443   BASOSABS 0.0 04/07/2022 2123   BASOSABS 0.0 12/30/2020 1443    CMP     Component Value Date/Time   NA 138 04/07/2022 2123   NA 144 12/30/2020 1443   K 3.2 (L) 04/07/2022 2123   CL 101 04/07/2022 2123   CO2 28 04/07/2022 2123   GLUCOSE 93 04/07/2022 2123   BUN 17 04/07/2022 2123   BUN 15 12/30/2020 1443   CREATININE 0.85 04/07/2022 2123   CREATININE 0.96 02/16/2016 1601   CALCIUM 9.5 04/07/2022 2123   PROT 7.6 04/07/2022 2123   PROT 6.4 12/30/2020 1443   ALBUMIN 4.2 04/07/2022 2123   ALBUMIN 4.2 12/30/2020 1443   AST 23 04/07/2022 2123   ALT 20 04/07/2022 2123   ALKPHOS 56 04/07/2022 2123   BILITOT 0.8 04/07/2022 2123   BILITOT <0.2 12/30/2020 1443   GFRNONAA >60 04/07/2022 2123   GFRNONAA >89 09/03/2012 0950   GFRAA 88 12/30/2020 1443   GFRAA >89 09/03/2012 0950    Assessment: 1.  Change in bowel habits: To constipation, CT showing constipation, no change with increase in fiber, up-to-date on colonoscopy; likely slow transit  Plan: 1.  Patient will start MiraLAX once daily for the next 3 days, if no real change in bowel habits and will increase to twice daily, titrate up to 4 times daily as needed. 2.  Cut back fiber to twice daily. 3.   Patient will let me know how she is doing, if no real results from MiraLAX then can try Linzess would start at 145 mcg daily 4.  Patient to follow in clinic with me in 2 to 3 months or sooner if necessary.  Hyacinth Meeker, PA-C [facility] 07/19/2023, 3:06 PM  Cc: Aliene Beams, MD

## 2023-07-26 ENCOUNTER — Other Ambulatory Visit: Payer: Self-pay

## 2023-07-26 ENCOUNTER — Emergency Department (HOSPITAL_BASED_OUTPATIENT_CLINIC_OR_DEPARTMENT_OTHER)
Admission: EM | Admit: 2023-07-26 | Discharge: 2023-07-26 | Disposition: A | Discharge status: Home / Self Care | Payer: Medicare HMO | Attending: Emergency Medicine | Admitting: Emergency Medicine

## 2023-07-26 ENCOUNTER — Emergency Department (HOSPITAL_BASED_OUTPATIENT_CLINIC_OR_DEPARTMENT_OTHER): Payer: Medicare HMO

## 2023-07-26 ENCOUNTER — Encounter (HOSPITAL_BASED_OUTPATIENT_CLINIC_OR_DEPARTMENT_OTHER): Payer: Self-pay | Admitting: Emergency Medicine

## 2023-07-26 DIAGNOSIS — T63461A Toxic effect of venom of wasps, accidental (unintentional), initial encounter: Secondary | ICD-10-CM | POA: Insufficient documentation

## 2023-07-26 DIAGNOSIS — Z7982 Long term (current) use of aspirin: Secondary | ICD-10-CM | POA: Diagnosis not present

## 2023-07-26 DIAGNOSIS — T782XXA Anaphylactic shock, unspecified, initial encounter: Secondary | ICD-10-CM | POA: Insufficient documentation

## 2023-07-26 DIAGNOSIS — R220 Localized swelling, mass and lump, head: Secondary | ICD-10-CM | POA: Diagnosis present

## 2023-07-26 LAB — CBC WITH DIFFERENTIAL/PLATELET
Abs Immature Granulocytes: 0.01 10*3/uL (ref 0.00–0.07)
Basophils Absolute: 0 10*3/uL (ref 0.0–0.1)
Basophils Relative: 0 %
Eosinophils Absolute: 0.1 10*3/uL (ref 0.0–0.5)
Eosinophils Relative: 1 %
HCT: 34.1 % — ABNORMAL LOW (ref 36.0–46.0)
Hemoglobin: 12.3 g/dL (ref 12.0–15.0)
Immature Granulocytes: 0 %
Lymphocytes Relative: 27 %
Lymphs Abs: 1.7 10*3/uL (ref 0.7–4.0)
MCH: 30.4 pg (ref 26.0–34.0)
MCHC: 36.1 g/dL — ABNORMAL HIGH (ref 30.0–36.0)
MCV: 84.4 fL (ref 80.0–100.0)
Monocytes Absolute: 0.3 10*3/uL (ref 0.1–1.0)
Monocytes Relative: 5 %
Neutro Abs: 4.1 10*3/uL (ref 1.7–7.7)
Neutrophils Relative %: 67 %
Platelets: 211 10*3/uL (ref 150–400)
RBC: 4.04 MIL/uL (ref 3.87–5.11)
RDW: 13 % (ref 11.5–15.5)
WBC: 6.2 10*3/uL (ref 4.0–10.5)
nRBC: 0 % (ref 0.0–0.2)

## 2023-07-26 LAB — TROPONIN I (HIGH SENSITIVITY): Troponin I (High Sensitivity): 4 ng/L (ref <18)

## 2023-07-26 LAB — BASIC METABOLIC PANEL
Anion gap: 10 (ref 5–15)
BUN: 19 mg/dL (ref 8–23)
CO2: 30 mmol/L (ref 22–32)
Calcium: 9.1 mg/dL (ref 8.9–10.3)
Chloride: 97 mmol/L — ABNORMAL LOW (ref 98–111)
Creatinine, Ser: 0.97 mg/dL (ref 0.44–1.00)
GFR, Estimated: >60 mL/min (ref >60)
Glucose, Bld: 106 mg/dL — ABNORMAL HIGH (ref 70–99)
Potassium: 2.8 mmol/L — ABNORMAL LOW (ref 3.5–5.1)
Sodium: 137 mmol/L (ref 135–145)

## 2023-07-26 MED ORDER — FAMOTIDINE IN NACL 20-0.9 MG/50ML-% IV SOLN
20.0000 mg | Freq: Once | INTRAVENOUS | Status: AC
  Administered 2023-07-26: 20 mg via INTRAVENOUS
  Filled 2023-07-26: qty 50

## 2023-07-26 MED ORDER — METHYLPREDNISOLONE SODIUM SUCC 125 MG IJ SOLR
125.0000 mg | Freq: Once | INTRAMUSCULAR | Status: AC
  Administered 2023-07-26: 125 mg via INTRAVENOUS
  Filled 2023-07-26: qty 2

## 2023-07-26 MED ORDER — EPINEPHRINE 0.3 MG/0.3ML IJ SOAJ: 0.3000 mg | INTRAMUSCULAR | 0 refills | Status: AC | PRN

## 2023-07-26 MED ORDER — FAMOTIDINE 20 MG PO TABS: 20.0000 mg | ORAL_TABLET | Freq: Every day | ORAL | 0 refills | Status: DC

## 2023-07-26 MED ORDER — SODIUM CHLORIDE 0.9 % IV BOLUS
1000.0000 mL | Freq: Once | INTRAVENOUS | Status: AC
  Administered 2023-07-26: 1000 mL via INTRAVENOUS

## 2023-07-26 MED ORDER — DIPHENHYDRAMINE HCL 50 MG/ML IJ SOLN
25.0000 mg | Freq: Once | INTRAMUSCULAR | Status: AC
  Administered 2023-07-26: 25 mg via INTRAVENOUS
  Filled 2023-07-26: qty 1

## 2023-07-26 MED ORDER — EPINEPHRINE 0.3 MG/0.3ML IJ SOAJ
0.3000 mg | Freq: Once | INTRAMUSCULAR | Status: AC
  Administered 2023-07-26: 0.3 mg via INTRAMUSCULAR
  Filled 2023-07-26: qty 0.3

## 2023-07-26 MED ORDER — PREDNISONE 50 MG PO TABS: ORAL_TABLET | ORAL | 0 refills | Status: DC

## 2023-07-26 MED ORDER — POTASSIUM CHLORIDE CRYS ER 20 MEQ PO TBCR
40.0000 meq | EXTENDED_RELEASE_TABLET | Freq: Once | ORAL | Status: AC
  Administered 2023-07-26: 40 meq via ORAL
  Filled 2023-07-26: qty 2

## 2023-07-26 NOTE — Discharge Instructions (Signed)
Take the steroids and antihistamines as prescribed.  Use the epinephrine pen only for severe allergic reaction with difficulty breathing, difficulty swallowing, chest pain, shortness of breath, tongue swelling or lip swelling.  If you use the epinephrine pen, you must come to the hospital afterwards to be observed.  Return to the ED with new or worsening symptoms.

## 2023-07-26 NOTE — ED Triage Notes (Signed)
Patient arrived via POV c/o allergic reaction to wasp stings x 7 hrs. Patient states being stung in left hand, right arm x 2. Patient states taking 50 mg benadryl at approximately 2100. Patient is AO x 4, VS WDL, normal gait.

## 2023-07-26 NOTE — ED Provider Notes (Signed)
[place] [facility] Provider Note   CSN: [id number] Arrival date & time: 07/26/23  0126     History  Chief Complaint  Patient presents with   Allergic Reaction    Tiffany Gonzales is a 67 y.o. female.  Patient states she was stung by wasp to her left hand, right forearm and left right upper arm about 4 PM.  She applied Benadryl cream to orally about 9.  Comes in with worsening pain, swelling, redness sting sites.  Does feel tight in her throat with some difficulty swallowing.  Does feel somewhat short of breath.  No chest pain.  No abdominal pain, nausea or vomiting.  Feels like there is something "stuck in my throat", with difficulty swallowing her spit.  Denies throat feeling tight but feels that is closing.  Denies chest pain but does feel short of breath.  No history of anaphylaxis previously.  The history is provided by the patient.  Allergic Reaction Presenting symptoms: no rash        Home Medications Prior to Admission medications   Medication Sig Start Date End Date Taking? Authorizing Provider  aspirin 81 MG tablet Take 81 mg by mouth daily.    [provider]  Calcium-Magnesium-Vitamin D 600-40-500 MG-MG-UNIT TB24 Take 1 tablet by mouth daily at 6 (six) AM.    [provider]  chlorthalidone (HYGROTON) 25 MG tablet Take 25 mg by mouth daily.    [provider]  levocetirizine (XYZAL) 5 MG tablet Take 1 tablet (5 mg total) by mouth daily. 02/23/21   Just, Azalee Course, FNP  Melatonin 5 MG CAPS Take by mouth.    [provider]  montelukast (SINGULAIR) 10 MG tablet TAKE 1 TABLET BY MOUTH AT BEDTIME. GENERIC EQUIVALENT FOR SINGULAIR 11/05/20   Just, Azalee Course, FNP  Multiple Vitamins-Minerals (MULTIVITAMIN GUMMIES ADULT PO) Take 2 each by mouth daily.    [provider]  rosuvastatin (CRESTOR) 20 MG tablet Take 20 mg by mouth at bedtime. 05/15/23   [provider]  triamcinolone  (NASACORT) 55 MCG/ACT AERO nasal inhaler Place 2 sprays into the nose daily. 2 sprays each nostril at night 06/16/21   Drema Halon, MD      Allergies    Iodinated contrast media, Lisinopril, and Shellfish allergy    Review of Systems   Review of Systems  Constitutional:  Negative for activity change, appetite change, fatigue and fever.  HENT:  Positive for sore throat. Negative for congestion.   Respiratory:  Positive for shortness of breath. Negative for cough and chest tightness.   Cardiovascular:  Negative for chest pain.  Gastrointestinal:  Negative for abdominal pain, nausea and vomiting.  Genitourinary:  Negative for dysuria.  Musculoskeletal:  Negative for arthralgias and myalgias.  Skin:  Negative for rash.  Neurological:  Negative for facial asymmetry and weakness.   all other systems are negative except as noted in the HPI and PMH.    Physical Exam Updated Vital Signs BP 106/82 (BP Location: Left Arm)   Pulse 78   Temp 98.5 F (36.9 C) (Oral)   Resp 19   Ht 5\' 2"  (1.575 m)   Wt 73.5 kg   SpO2 98%   BMI 29.63 kg/m  Physical Exam Vitals and nursing note reviewed.  Constitutional:      General: She is not in acute distress.    Appearance: She is well-developed.  HENT:     Head: Normocephalic and atraumatic.  Mouth/Throat:     Pharynx: No oropharyngeal exudate.     Comments: Uvula midline, controlling secretions, no tongue or lip swelling Eyes:     Conjunctiva/sclera: Conjunctivae normal.     Pupils: Pupils are equal, round, and reactive to light.  Neck:     Comments: No meningismus. Cardiovascular:     Rate and Rhythm: Normal rate and regular rhythm.     Heart sounds: Normal heart sounds. No murmur heard. Pulmonary:     Effort: Pulmonary effort is normal. No respiratory distress.     Breath sounds: Normal breath sounds.  Abdominal:     Palpations: Abdomen is soft.     Tenderness: There is no abdominal tenderness. There is no guarding or  rebound.  Musculoskeletal:        General: No tenderness. Normal range of motion.     Cervical back: Normal range of motion and neck supple.  Skin:    General: Skin is warm.     Findings: Erythema and rash present.     Comments: Swelling left dorsal hand.  Redness and swelling right palmar forearm and right upper arm.  Neurological:     Mental Status: She is alert and oriented to person, place, and time.     Cranial Nerves: No cranial nerve deficit.     Motor: No abnormal muscle tone.     Coordination: Coordination normal.     Comments:  5/5 strength throughout. CN 2-12 intact.Equal grip strength.   Psychiatric:        Behavior: Behavior normal.     ED Results / Procedures / Treatments   Labs (all labs ordered are listed, but only abnormal results are displayed) Labs Reviewed  CBC WITH DIFFERENTIAL/PLATELET - Abnormal; Notable for the following components:      Result Value   HCT 34.1 (*)    MCHC 36.1 (*)    All other components within normal limits  BASIC METABOLIC PANEL - Abnormal; Notable for the following components:   Potassium 2.8 (*)    Chloride 97 (*)    Glucose, Bld 106 (*)    All other components within normal limits  TROPONIN I (HIGH SENSITIVITY)  TROPONIN I (HIGH SENSITIVITY)    EKG EKG Interpretation Date/Time:  Wednesday July 26 2023 02:39:42 EDT Ventricular Rate:  69 PR Interval:  176 QRS Duration:  89 QT Interval:  409 QTC Calculation: 439 R Axis:   -3  Text Interpretation: Sinus rhythm Abnormal R-wave progression, early transition No significant change was found Confirmed by Glynn Octave [phone]) on 07/26/2023 2:56:59 AM  Radiology DG Chest Portable 1 View  Result Date: 07/26/2023 CLINICAL DATA:  Chest pain EXAM: PORTABLE CHEST 1 VIEW COMPARISON:  None Available. FINDINGS: The heart size and mediastinal contours are within normal limits. Both lungs are clear. The visualized skeletal structures are unremarkable. IMPRESSION: Normal study.  Electronically Signed   By: Charlett Nose M.D.   On: 07/26/2023 02:22    Procedures .Critical Care  Performed by: Glynn Octave, MD Authorized by: Glynn Octave, MD   Critical care provider statement:    Critical care time (minutes):  35   Critical care time was exclusive of:  Separately billable procedures and treating other patients   Critical care was necessary to treat or prevent imminent or life-threatening deterioration of the following conditions: anaphylaxis.   Critical care was time spent personally by me on the following activities:  Development of treatment plan with patient or surrogate, discussions with consultants, evaluation of patient's response  to treatment, examination of patient, ordering and review of laboratory studies, ordering and review of radiographic studies, ordering and performing treatments and interventions, pulse oximetry, re-evaluation of patient's condition, review of old charts, blood draw for specimens and obtaining history from patient or surrogate   I assumed direction of critical care for this patient from another provider in my specialty: no     Care discussed with: admitting provider       Medications Ordered in ED Medications  EPINEPHrine (EPI-PEN) injection 0.3 mg (has no administration in time range)  sodium chloride 0.9 % bolus 1,000 mL (has no administration in time range)  methylPREDNISolone sodium succinate (SOLU-MEDROL) 125 mg/2 mL injection 125 mg (has no administration in time range)  famotidine (PEPCID) IVPB 20 mg premix (has no administration in time range)  diphenhydrAMINE (BENADRYL) injection 25 mg (has no administration in time range)    ED Course/ Medical Decision Making/ A&P                                 Medical Decision Making Amount and/or Complexity of Data Reviewed Labs: ordered. Decision-making details documented in ED Course. Radiology: ordered and independent interpretation performed. Decision-making details  documented in ED Course. ECG/medicine tests: ordered and independent interpretation performed. Decision-making details documented in ED Course.  Risk Prescription drug management.   Wasp sting since 4 PM with worsening redness and swelling.  Some throat tightness and difficulty breathing and swallowing.  Epinephrine given with concern for anaphylaxis. IV fluids, steroids and antihistamines  Observed in the ED 3 hours status post epinephrine injection.  No deterioration of symptoms.  She feels back to baseline.  Throat tightness and shortness of breath have resolved.  No chest pain.  EKG is sinus rhythm.  Chest x-ray is negative for infiltrate.  Lab reassuring.  Troponin negative.  Potassium low and replaced.  She is given steroids, antihistamines for home use as well as epinephrine pen with instructions for use.  Suspect anaphylaxis secondary to wasp sting.  Continue steroids and antihistamines for home use.  Use epinephrine pen only for difficulty breathing, difficulty swallowing, chest pain, shortness of breath, tongue swelling, lip swelling or other concerns.        Final Clinical Impression(s) / ED Diagnoses Final diagnoses:  Anaphylaxis, initial encounter    Rx / DC Orders ED Discharge Orders     None         Jniya Madara, Jeannett Senior, MD 07/26/23 [phone]

## 2023-09-29 ENCOUNTER — Ambulatory Visit: Payer: Medicare HMO | Admitting: Physician Assistant

## 2023-11-02 ENCOUNTER — Ambulatory Visit: Payer: Medicare HMO | Admitting: Physician Assistant

## 2023-11-02 ENCOUNTER — Encounter: Payer: Self-pay | Admitting: Physician Assistant

## 2023-11-02 VITALS — BP 114/70 | HR 72 | Ht 63.0 in | Wt 158.2 lb

## 2023-11-02 DIAGNOSIS — L290 Pruritus ani: Secondary | ICD-10-CM

## 2023-11-02 DIAGNOSIS — K5900 Constipation, unspecified: Secondary | ICD-10-CM

## 2023-11-02 DIAGNOSIS — K5730 Diverticulosis of large intestine without perforation or abscess without bleeding: Secondary | ICD-10-CM | POA: Diagnosis not present

## 2023-11-02 NOTE — Patient Instructions (Addendum)
Please purchase the following medications over the counter and take as directed: Balnex-  mix with recti care advanced- 3x daily   Continue fiber supplement.   Get baby wipes instead of toilet paper.   Please reach out with any issues or concerns.  _______________________________________________________  If your blood pressure at your visit was 140/90 or greater, please contact your primary care physician to follow up on this.  _______________________________________________________  If you are age 73 or older, your body mass index should be between 23-30. Your Body mass index is 28.03 kg/m. If this is out of the aforementioned range listed, please consider follow up with your Primary Care Provider.  If you are age 31 or younger, your body mass index should be between 19-25. Your Body mass index is 28.03 kg/m. If this is out of the aformentioned range listed, please consider follow up with your Primary Care Provider.   ________________________________________________________  The [place] [facility] providers would like to encourage you to use [facility] to communicate with providers for non-urgent requests or questions.  Due to long hold times on the telephone, sending your provider a message by [facility] may be a faster and more efficient way to get a response.  Please allow 48 business hours for a response.  Please remember that this is for non-urgent requests.  _______________________________________________________ It was a pleasure to see you today!  Thank you for trusting me with your gastrointestinal care!

## 2023-11-02 NOTE — Progress Notes (Signed)
Subjective:    Patient ID: Tiffany Gonzales, female    DOB: May 20, 1956, 67 y.o.   MRN: [id number]  HPI  Tiffany Gonzales  is a pleasant 67 yo female, established with Dr. Lavon Paganini, who was last seen in the office in August 2024 by Hyacinth Meeker, PA-C for change in bowel habits.  She was also noticing some increased intermittent fecal smearing.  She has been taking fiber supplement on a regular basis since then and says that that has helped her symptoms but she still will have some fecal soilage off and on if her stools get too soft or loose. She did have CT of the abdomen and pelvis done in August 2024 which did not show any acute or concerning abnormalities.  She does have diverticuli in the descending and sigmoid colon and had some increase in stool consistent with constipation at that time. 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 She comes in today with new complaint of perianal/anal itching and burning over the past 3 weeks.  She has attributed this to hemorrhoids and has been treating it with over-the-counter Preparation H cream and hemorrhoid wipes.  She says she has been seeing small amounts of blood on the tissue with wiping but no blood noted in the stool or in the commode.  She really has not had any improvement in symptoms and therefore made an appointment here. She does not recall having any significant straining or hard stools prior to onset of symptoms, says overall her constipation symptoms are better and has been having a bowel movement at least 4 to 5 days/week.  No internal rectal pain  Review of Systems Pertinent positive and negative review of systems were noted in the above HPI section.  All other review of systems was otherwise negative.   Outpatient Encounter Medications as of 11/02/2023  Medication Sig   Apoaequorin  (PREVAGEN PO) Take 1 tablet by mouth daily.   aspirin 81 MG tablet Take 81 mg by mouth daily.   Calcium-Magnesium-Vitamin D 600-40-500 MG-MG-UNIT TB24 Take 1 tablet by mouth daily at 6 (six) AM.   chlorthalidone (HYGROTON) 25 MG tablet Take 25 mg by mouth daily.   ipratropium (ATROVENT) 0.03 % nasal spray Place 2 sprays into both nostrils 2 (two) times daily.   levocetirizine (XYZAL) 5 MG tablet Take 1 tablet (5 mg total) by mouth daily.   Melatonin 5 MG CAPS Take by mouth.   mometasone (NASONEX) 50 MCG/ACT nasal spray Place 2 sprays into the nose daily.   montelukast (SINGULAIR) 10 MG tablet TAKE 1 TABLET BY MOUTH AT BEDTIME. GENERIC EQUIVALENT FOR SINGULAIR   Multiple Vitamins-Minerals (MULTIVITAMIN GUMMIES ADULT PO) Take 2 each by mouth daily.   potassium chloride (KLOR-CON) 10 MEQ tablet Take 20 mEq by mouth daily.   rosuvastatin (CRESTOR) 20 MG tablet Take 20 mg by mouth at bedtime.   triamcinolone (NASACORT) 55 MCG/ACT AERO nasal inhaler Place 2 sprays into the nose daily. 2 sprays each nostril at night   EPINEPHrine 0.3 mg/0.3 mL IJ SOAJ injection Inject 0.3 mg into the muscle as needed for anaphylaxis (for severe allergic reaction only with Chest pain, shortness of breath, tongue or lip swelling.). (Patient not taking: Reported on 11/02/2023)   [DISCONTINUED] famotidine (PEPCID) 20 MG tablet Take 1 tablet (20 mg total) by mouth daily.   [DISCONTINUED] predniSONE (DELTASONE) 50 MG tablet  1 tablet PO daily   No facility-administered encounter medications on file as of 11/02/2023.   Allergies  Allergen Reactions   Iodinated Contrast Media Shortness Of Breath and Other (See Comments)    Pt states she is not allergic to contrast; has been given contrast on 07/10/2023 with 13 hour premedication w/o incident; has asked PMD to remove allergy   Lisinopril Hives   Amlodipine Swelling   Shellfish Allergy    Patient Active Problem List   Diagnosis Date Noted   Plantar fasciitis of right foot  03/13/2020   Osteoarthritis of both hips 03/13/2020   Sialolithiasis 08/30/2018   Breast abscess 04/08/2015   Memory loss 12/09/2013   History of insect sting allergy 11/18/2012   Post-menopause atrophic vaginitis 09/03/2012   Allergic reaction 06/05/2012   OSA (obstructive sleep apnea) 11/08/2011   Cervical disc disease 06/28/2011   TOBACCO ABUSE 03/01/2010   LEUKOPENIA, MILD 12/10/2008   HYPERCHOLESTEROLEMIA 12/09/2008   Essential hypertension 03/05/2008   DEPRESSIVE DISORDER 11/13/2007   Social History   Socioeconomic History   Marital status: Divorced    Spouse name: Not on file   Number of children: 2   Years of education: Not on file   Highest education level: Not on file  Occupational History   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[employer]  Tobacco Use   Smoking status: Former    Current packs/day: 0.00    Average packs/day: 0.3 packs/day for 34.0 years (10.2 ttl pk-yrs)    Types: Cigarettes    Start date: 08/13/1984    Quit date: 08/13/2018    Years since quitting: 5.2   Smokeless tobacco: Never   Tobacco comments:    7 cigs daily 08/20/13  Vaping Use   Vaping status: Never Used  Substance and Sexual Activity   Alcohol use: Yes    Alcohol/week: 1.0 standard drink of alcohol    Types: 1 Glasses of wine per week    Comment: per day   Drug use: No   Sexual activity: Yes    Birth control/protection: Surgical  Other Topics Concern   Not on file  Social History Narrative   Retired   Divorced   2 children   Social Drivers of Corporate investment banker Strain: Not on file  Food Insecurity: Not on file  Transportation Needs: Not on file  Physical Activity: Not on file  Stress: Not on file  Social Connections: Not on file  Intimate Partner Violence: Not on file    Ms. Gains's family history includes Arthritis in her mother; Heart disease in her maternal grandmother and paternal grandmother; Hypertension in her father.      Objective:     Vitals:   11/02/23 1425  BP: 114/70  Pulse: 72    Physical Exam Well-developed well-nourished older AA female 89 in no acute distress.  Height, Weight,158 BMI 28.03  HEENT; nontraumatic normocephalic, EOMI, PE R LA, sclera anicteric. Oropharynx;not done Neck; supple, no JVD Cardiovascular; regular rate and rhythm with S1-S2, no murmur rub or gallop Pulmonary; Clear bilaterally Abdomen; soft, nontender, nondistended, no palpable mass or hepatosplenomegaly, bowel sounds are active Rectal;nontender on digital exam, no fissure , mild decrease in sphincter tone  with squeeze, small perianal excoriations , no blisters, no surrounding erythema, no obvious yeast , small external hemorrhoidal tags Skin; benign exam, no jaundice rash or appreciable lesions Extremities; no clubbing cyanosis or edema skin warm and dry Neuro/Psych; alert and oriented x4, grossly nonfocal mood and  affect appropriate        Assessment & Plan:   #93 67 year old African-American female with 3-week history of perianal/anal itching and burning. On exam there is no evidence of a fissure, and I do not see any significant hemorrhoids.  She does have several small excoriations which I think are from scratching, these lesions are tiny, do not appear to be fluid-filled, there is no obvious perianal erythema or thrush/yeast.  Will treat as pruritus ani  #2 colon cancer screening-up-to-date last colonoscopy done July 2021-no polyps #3.  Scattered pandiverticulosis #4.  Mild constipation #5.  Sleep apnea #6.  Hypertension #7.  Osteoarthritis  Plan; we discussed switching to hypoallergenic baby wipes. Continue fiber Gummies 2/day. Will treat with Balmex cream, mixed with RectiCare advanced, apply after bowel movement, and 3-4 times daily total.  Treat until symptoms resolve, patient cautioned this may take a couple of weeks. If she does not have resolution of symptoms of asked her to call back and speak to my nurse. We  also briefly discussed referral to pelvic floor physical therapy, she wants to think about this and may pursue if symptoms worsen.  Shavaughn Seidl Oswald Hillock PA-C 11/02/2023   Cc: Aliene Beams, MD

## 2023-12-04 DIAGNOSIS — G4733 Obstructive sleep apnea (adult) (pediatric): Secondary | ICD-10-CM | POA: Diagnosis not present

## 2023-12-04 DIAGNOSIS — J439 Emphysema, unspecified: Secondary | ICD-10-CM | POA: Diagnosis not present

## 2023-12-04 DIAGNOSIS — I10 Essential (primary) hypertension: Secondary | ICD-10-CM | POA: Diagnosis not present

## 2023-12-04 DIAGNOSIS — R7301 Impaired fasting glucose: Secondary | ICD-10-CM | POA: Diagnosis not present

## 2023-12-04 DIAGNOSIS — E7800 Pure hypercholesterolemia, unspecified: Secondary | ICD-10-CM | POA: Diagnosis not present

## 2023-12-04 DIAGNOSIS — Z0000 Encounter for general adult medical examination without abnormal findings: Secondary | ICD-10-CM | POA: Diagnosis not present

## 2023-12-04 DIAGNOSIS — Z79899 Other long term (current) drug therapy: Secondary | ICD-10-CM | POA: Diagnosis not present

## 2023-12-04 DIAGNOSIS — Z23 Encounter for immunization: Secondary | ICD-10-CM | POA: Diagnosis not present

## 2023-12-04 DIAGNOSIS — J309 Allergic rhinitis, unspecified: Secondary | ICD-10-CM | POA: Diagnosis not present

## 2023-12-04 DIAGNOSIS — H6123 Impacted cerumen, bilateral: Secondary | ICD-10-CM | POA: Diagnosis not present

## 2023-12-25 DIAGNOSIS — T148XXA Other injury of unspecified body region, initial encounter: Secondary | ICD-10-CM | POA: Diagnosis not present

## 2024-01-11 ENCOUNTER — Ambulatory Visit (INDEPENDENT_AMBULATORY_CARE_PROVIDER_SITE_OTHER): Payer: Medicare HMO | Admitting: Otolaryngology

## 2024-01-11 ENCOUNTER — Encounter (INDEPENDENT_AMBULATORY_CARE_PROVIDER_SITE_OTHER): Payer: Self-pay | Admitting: Otolaryngology

## 2024-01-11 VITALS — BP 127/78 | HR 73 | Ht 64.0 in | Wt 158.0 lb

## 2024-01-11 DIAGNOSIS — K219 Gastro-esophageal reflux disease without esophagitis: Secondary | ICD-10-CM | POA: Diagnosis not present

## 2024-01-11 DIAGNOSIS — J343 Hypertrophy of nasal turbinates: Secondary | ICD-10-CM

## 2024-01-11 DIAGNOSIS — R0981 Nasal congestion: Secondary | ICD-10-CM

## 2024-01-11 DIAGNOSIS — J342 Deviated nasal septum: Secondary | ICD-10-CM

## 2024-01-11 DIAGNOSIS — J3089 Other allergic rhinitis: Secondary | ICD-10-CM | POA: Diagnosis not present

## 2024-01-11 DIAGNOSIS — R1310 Dysphagia, unspecified: Secondary | ICD-10-CM

## 2024-01-11 DIAGNOSIS — J3489 Other specified disorders of nose and nasal sinuses: Secondary | ICD-10-CM | POA: Diagnosis not present

## 2024-01-11 MED ORDER — FAMOTIDINE 20 MG PO TABS: 20.0000 mg | ORAL_TABLET | Freq: Two times a day (BID) | ORAL | 1 refills | Status: AC

## 2024-01-11 NOTE — Patient Instructions (Signed)

## 2024-01-11 NOTE — Progress Notes (Signed)
ENT CONSULT:  Reason for Consult: chronic nasal congestion, worse x 1 mo, dysphagia x 1 mo   HPI: Discussed the use of AI scribe software for clinical note transcription with the patient, who gave verbal consent to proceed.  History of Present Illness   Tiffany Gonzales "Tiffany Gonzales" is a 68 year old female with hx of sleep apnea on CPAP who presents with chronic nasal congestion/obstriction and swallowing difficulties.  She has been experiencing nasal congestion primarily on the left side of her nose. The congestion is constant, with either her nose being stopped up or constantly running, necessitating frequent blowing. Despite using medications such as levocetirizine, Nasonex spray, and Singulair for her year-round allergies, she finds them ineffective in alleviating her symptoms. She has a history of nasal surgery on the left side in 2012, but she does not recall the specific procedure.  She also reports difficulty swallowing, which began around the same time as the nasal congestion. She describes a sensation of something in her throat, requiring a 'second swallow' to clear it. No history of heartburn or reflux and has never been on medication for these conditions. She has not undergone a swallow test before.  Diagnosed with sleep apnea in 2018 and has been using a CPAP machine. However, she stopped using it about a month ago due to discomfort caused by nasal congestion. She had been compliant with CPAP use until then.  She mentions a weight gain from 140 to 158 pounds, attributing it to increased appetite during cold weather. She also notes occasional swelling of her salivary glands, which she manages with warm compresses and increased fluid intake.     Records Reviewed:  ED visit 07/26/23 Patient states she was stung by wasp to her left hand, right forearm and left right upper arm about 4 PM. She applied Benadryl cream to orally about 9. Comes in with worsening pain, swelling, redness sting  sites. Does feel tight in her throat with some difficulty swallowing. Does feel somewhat short of breath. No chest pain. No abdominal pain, nausea or vomiting. Feels like there is something "stuck in my throat", with difficulty swallowing her spit. Denies throat feeling tight but feels that is closing. Denies chest pain but does feel short of breath.   Given Epi and observed.     Past Medical History:  Diagnosis Date   Allergy    seasonal   Allergy    foods-corn, pork, apples, shellfish   Atypical chest pain 01/2007   negative stress test   Colon polyps    Depression    Fibroids    Hyperlipidemia    Hypertension    Panic attacks    Sleep apnea    uses cpap    Past Surgical History:  Procedure Laterality Date   ABDOMINAL HYSTERECTOMY  1998   CATARACT EXTRACTION     COLONOSCOPY  03/30/2010   hyperplastic polyps   NASAL SINUS SURGERY  2007   Dr Esperanza Richters out area to help breath    Family History  Problem Relation Age of Onset   Arthritis Mother    Hypertension Father    Heart disease Maternal Grandmother    Heart disease Paternal Grandmother     Social History:  reports that she quit smoking about 5 years ago. Her smoking use included cigarettes. She started smoking about 39 years ago. She has a 10.2 pack-year smoking history. She has never used smokeless tobacco. She reports current alcohol use of about 1.0 standard drink of alcohol per  week. She reports that she does not use drugs.  Allergies:  Allergies  Allergen Reactions   Iodinated Contrast Media Shortness Of Breath and Other (See Comments)    Pt states she is not allergic to contrast; has been given contrast on 07/10/2023 with 13 hour premedication w/o incident; has asked PMD to remove allergy   Lisinopril Hives   Amlodipine Swelling   Shellfish Allergy     Medications: I have reviewed the patient's current medications.  The PMH, PSH, Medications, Allergies, and SH were reviewed and  updated.  ROS: Constitutional: Negative for fever, weight loss and weight gain. Cardiovascular: Negative for chest pain and dyspnea on exertion. Respiratory: Is not experiencing shortness of breath at rest. Gastrointestinal: Negative for nausea and vomiting. Neurological: Negative for headaches. Psychiatric: The patient is not nervous/anxious  Blood pressure 127/78, pulse 73, height 5\' 4"  (1.626 m), weight 158 lb (71.7 kg), SpO2 98%. Body mass index is 27.12 kg/m.  PHYSICAL EXAM:  Exam: General: Well-developed, well-nourished Communication and Voice: slightly raspy Respiratory Respiratory effort: Equal inspiration and expiration without stridor Cardiovascular Peripheral Vascular: Warm extremities with equal color/perfusion Eyes: No nystagmus with equal extraocular motion bilaterally Neuro/Psych/Balance: Patient oriented to person, place, and time; Appropriate mood and affect; Gait is intact with no imbalance; Cranial nerves I-XII are intact Head and Face Inspection: Normocephalic and atraumatic without mass or lesion Palpation: Facial skeleton intact without bony stepoffs Salivary Glands: No mass or tenderness Facial Strength: Facial motility symmetric and full bilaterally ENT Pinna: External ear intact and fully developed External canal: Canal is patent with intact skin Tympanic Membrane: Clear and mobile External Nose: No scar or anatomic deformity Internal Nose: Septum is with slight deviation to the left. No polyp, or purulence. Mucosal edema and erythema present.  Lips, Teeth, and gums: Mucosa and teeth intact and viable TMJ: No pain to palpation with full mobility Oral cavity/oropharynx: No erythema or exudate, no lesions present Nasopharynx: No mass or lesion with intact mucosa Hypopharynx: Intact mucosa without pooling of secretions Larynx Glottic: Full true vocal cord mobility without lesion or mass Supraglottic: Normal appearing epiglottis and AE  folds Interarytenoid Space: Moderate pachydermia&edema Subglottic Space: Patent without lesion or edema Neck Neck and Trachea: Midline trachea without mass or lesion Thyroid: No mass or nodularity Lymphatics: No lymphadenopathy  Procedure: Preoperative diagnosis: dysphagia   Postoperative diagnosis:   Same + GERD LPR  Procedure: Flexible fiberoptic laryngoscopy  Surgeon: Ashok Croon, MD  Anesthesia: Topical lidocaine and Afrin Complications: None Condition is stable throughout exam  Indications and consent:  The patient presents to the clinic with dysphonia.  Indirect laryngoscopy view was incomplete. Thus it was recommended that they undergo a flexible fiberoptic laryngoscopy. All of the risks, benefits, and potential complications were reviewed with the patient preoperatively and verbal informed consent was obtained.  Procedure: The patient was seated upright in the clinic. Topical lidocaine and Afrin were applied to the nasal cavity. After adequate anesthesia had occurred, I then proceeded to pass the flexible telescope into the nasal cavity. The nasal cavity was patent without rhinorrhea or polyp. The nasopharynx was also patent without mass or lesion. The base of tongue was visualized and was normal. There were no signs of pooling of secretions in the piriform sinuses. The true vocal folds were mobile bilaterally. There were no signs of glottic or supraglottic mucosal lesion or mass. There was moderate interarytenoid pachydermia and post cricoid edema. The telescope was then slowly withdrawn and the patient tolerated the procedure throughout.  PROCEDURE NOTE: nasal endoscopy  Preoperative diagnosis: chronic nasal congestion and obstruction symptoms CPAP intolerance   Postoperative diagnosis: same  Procedure: Diagnostic nasal endoscopy (78295)  Surgeon: Ashok Croon, M.D.  Anesthesia: Topical lidocaine and Afrin  H&P REVIEW: The patient's history and physical were  reviewed today prior to procedure. All medications were reviewed and updated as well. Complications: None Condition is stable throughout exam Indications and consent: The patient presents with symptoms of chronic sinusitis not responding to previous therapies. All the risks, benefits, and potential complications were reviewed with the patient preoperatively and informed consent was obtained. The time out was completed with confirmation of the correct procedure.   Procedure: The patient was seated upright in the clinic. Topical lidocaine and Afrin were applied to the nasal cavity. After adequate anesthesia had occurred, the rigid nasal endoscope was passed into the nasal cavity. The nasal mucosa, turbinates, septum, and sinus drainage pathways were visualized bilaterally. This revealed no purulence or significant secretions that might be cultured. There were no polyps or sites of significant inflammation. The mucosa was intact and there was no crusting present. The scope was then slowly withdrawn and the patient tolerated the procedure well. There were no complications or blood loss.   Studies Reviewed: CXR 07/26/23 - unremarkable   Assessment/Plan: Encounter Diagnoses  Name Primary?   Chronic nasal congestion Yes   Nasal obstruction    Environmental and seasonal allergies    Hypertrophy of both inferior nasal turbinates    Nasal septal deviation    Dysphagia, unspecified type    Chronic GERD     Assessment and Plan    Chronic Nasal Congestion and L > R nasal obstruction  Environmental Allergies  Chronic nasal congestion primarily affecting the left side, worsening over the past month. History of nasal surgery on the left side in 2012. Examination revealed open nasal passages with clear drainage and mucosal edema, but no significant septal deviation or inferior turbinate hypertrophy. Her surgery on the left side could have been septo/ITR since left IT appears shrunken. CPAP use exacerbates  nasal congestion due to artificial air pressure. Discussed potential resistance to current antihistamines and a trial of another antihistamine for improved symptom control.  - Recommend nasal saline rinses with NeilMed bottle - Continue current allergy medications (levocetirizine, Nasonex, Singulair) and consider changing to a different antihistamine    Obstructive Sleep Apnea Diagnosed in 2018, using CPAP since then. Recently stopped CPAP due to discomfort from nasal congestion. Addressing nasal congestion may improve CPAP tolerance. - Address nasal congestion to improve CPAP tolerance  Dysphagia Difficulty swallowing with sensation of food stuck in the throat. No history of stroke or other significant medical events. Examination showed post-cricoid edema/pachydermia, but no pooling of secretions in pyriforms and no masses or lesions. Chronic inflammation from reflux may contribute to dysphagia 2/2 CP spasm hypertrophy. Swallow test to be ordered to assess swallowing function. - Order swallow study MBS esophagram - Trial of Pepcid (famotidine) 20 mg BID for reflux - Recommend Reflux Gourmet supplement after meals - Provide information on reflux dietary and lifestyle changes  GERD LPR Suspected chronic inflammation from reflux contributing to dysphagia. Examination showed reflux changes. Denies history of heartburn or reflux but reports symptoms consistent with globus sensation. Reflux can cause lump sensation, throat clearing, and cough. Trial of Pepcid and Reflux Gourmet supplement may help alleviate symptoms. - Trial of Pepcid (famotidine) - Recommend Reflux Gourmet supplement - Provide information on reflux dietary and lifestyle changes  Follow-up - Schedule follow-up  appointment after swallow study.      Thank you for allowing me to participate in the care of this patient. Please do not hesitate to contact me with any questions or concerns.   Ashok Croon, MD Otolaryngology [facility] Phone: [phone] Fax: [phone]    01/11/2024, 4:43 PM

## 2024-01-15 ENCOUNTER — Other Ambulatory Visit (HOSPITAL_COMMUNITY): Payer: Self-pay | Admitting: Otolaryngology

## 2024-01-15 DIAGNOSIS — R1310 Dysphagia, unspecified: Secondary | ICD-10-CM

## 2024-01-15 DIAGNOSIS — R059 Cough, unspecified: Secondary | ICD-10-CM

## 2024-01-18 ENCOUNTER — Ambulatory Visit (HOSPITAL_COMMUNITY)
Admission: RE | Admit: 2024-01-18 | Discharge: 2024-01-18 | Discharge status: Home / Self Care | Payer: Medicare HMO | Source: Ambulatory Visit | Attending: Family Medicine

## 2024-01-18 ENCOUNTER — Ambulatory Visit (HOSPITAL_COMMUNITY)
Admission: RE | Admit: 2024-01-18 | Discharge: 2024-01-18 | Disposition: A | Discharge status: Home / Self Care | Payer: Medicare HMO | Source: Ambulatory Visit | Attending: Otolaryngology | Admitting: Otolaryngology

## 2024-01-18 ENCOUNTER — Ambulatory Visit (HOSPITAL_COMMUNITY)
Admission: RE | Admit: 2024-01-18 | Discharge: 2024-01-18 | Disposition: A | Discharge status: Home / Self Care | Payer: Medicare HMO | Source: Ambulatory Visit | Attending: Family Medicine | Admitting: Family Medicine

## 2024-01-18 ENCOUNTER — Other Ambulatory Visit (INDEPENDENT_AMBULATORY_CARE_PROVIDER_SITE_OTHER): Payer: Self-pay | Admitting: Otolaryngology

## 2024-01-18 DIAGNOSIS — R1310 Dysphagia, unspecified: Secondary | ICD-10-CM

## 2024-01-18 DIAGNOSIS — K219 Gastro-esophageal reflux disease without esophagitis: Secondary | ICD-10-CM | POA: Diagnosis not present

## 2024-01-18 DIAGNOSIS — R059 Cough, unspecified: Secondary | ICD-10-CM

## 2024-01-18 DIAGNOSIS — J3089 Other allergic rhinitis: Secondary | ICD-10-CM

## 2024-01-18 DIAGNOSIS — J343 Hypertrophy of nasal turbinates: Secondary | ICD-10-CM

## 2024-01-18 DIAGNOSIS — R0981 Nasal congestion: Secondary | ICD-10-CM

## 2024-01-18 DIAGNOSIS — J3489 Other specified disorders of nose and nasal sinuses: Secondary | ICD-10-CM

## 2024-01-18 DIAGNOSIS — J342 Deviated nasal septum: Secondary | ICD-10-CM

## 2024-01-18 NOTE — Progress Notes (Signed)
 Modified Barium Swallow Study  Patient Details  Name: Tiffany Gonzales MRN: [id number] Date of Birth: 11/07/1956  Today's Date: 01/18/2024  Modified Barium Swallow completed.  Full report located under Chart Review in the Imaging Section.  History of Present Illness Tiffany Gonzales is a 68 yo female presenting for OP MBS. Recently seen by ENT 2/20 due to primarily L sided nasal congestion and difficulty swallowing x 1 month. This is described by a globus sensation. Laryngoscopy showed moderate interarytenoid pachydermia and post cricoid edema. PMH includes sleep anea on CPAP   Clinical Impression Pt exhibits a functional oropharyngeal swallow. There are suspected cervical osteophytes that protrude anteriorly, but do not appear to affect flow into the UES. No penetration or aspiration occurred with any consistencies trialed. She endorsed a globus sensation throughout. Continue current diet with general esophageal precautions in place, of which education was provided. Factors that may increase risk of adverse event in presence of aspiration [employer]ye [employer]aks [employer]ance Coots 2021):    Swallow Evaluation Recommendations Recommendations: PO diet PO Diet Recommendation: Regular;Thin liquids (Level 0) Liquid Administration via: Cup;Straw Medication Administration: Whole meds with liquid Supervision: Patient able to self-feed Swallowing strategies  : Slow rate;Small bites/sips Postural changes: Position pt fully upright for meals;Stay upright 30-60 min after meals Oral care recommendations: Oral care BID (2x/day)      Gwynneth Aliment, M.A., CF-SLP Speech Language Pathology, Acute Rehabilitation Services  Secure Chat preferred [phone]  01/18/2024,2:14 PM

## 2024-03-13 ENCOUNTER — Ambulatory Visit (INDEPENDENT_AMBULATORY_CARE_PROVIDER_SITE_OTHER): Payer: Medicare HMO | Admitting: Otolaryngology

## 2024-04-16 DIAGNOSIS — J439 Emphysema, unspecified: Secondary | ICD-10-CM | POA: Diagnosis not present

## 2024-04-16 DIAGNOSIS — I10 Essential (primary) hypertension: Secondary | ICD-10-CM | POA: Diagnosis not present

## 2024-04-20 DIAGNOSIS — I10 Essential (primary) hypertension: Secondary | ICD-10-CM | POA: Diagnosis not present

## 2024-04-20 DIAGNOSIS — E7800 Pure hypercholesterolemia, unspecified: Secondary | ICD-10-CM | POA: Diagnosis not present

## 2024-04-20 DIAGNOSIS — J439 Emphysema, unspecified: Secondary | ICD-10-CM | POA: Diagnosis not present

## 2024-04-25 DIAGNOSIS — R5383 Other fatigue: Secondary | ICD-10-CM | POA: Diagnosis not present

## 2024-04-25 DIAGNOSIS — E7800 Pure hypercholesterolemia, unspecified: Secondary | ICD-10-CM | POA: Diagnosis not present

## 2024-04-25 DIAGNOSIS — G4733 Obstructive sleep apnea (adult) (pediatric): Secondary | ICD-10-CM | POA: Diagnosis not present

## 2024-04-25 DIAGNOSIS — I10 Essential (primary) hypertension: Secondary | ICD-10-CM | POA: Diagnosis not present

## 2024-04-25 DIAGNOSIS — I700 Atherosclerosis of aorta: Secondary | ICD-10-CM | POA: Diagnosis not present

## 2024-05-15 DIAGNOSIS — I10 Essential (primary) hypertension: Secondary | ICD-10-CM | POA: Diagnosis not present

## 2024-05-15 DIAGNOSIS — J439 Emphysema, unspecified: Secondary | ICD-10-CM | POA: Diagnosis not present

## 2024-05-31 DIAGNOSIS — I700 Atherosclerosis of aorta: Secondary | ICD-10-CM | POA: Diagnosis not present

## 2024-05-31 DIAGNOSIS — E7800 Pure hypercholesterolemia, unspecified: Secondary | ICD-10-CM | POA: Diagnosis not present

## 2024-05-31 DIAGNOSIS — E678 Other specified hyperalimentation: Secondary | ICD-10-CM | POA: Diagnosis not present

## 2024-05-31 DIAGNOSIS — I10 Essential (primary) hypertension: Secondary | ICD-10-CM | POA: Diagnosis not present

## 2024-06-10 DIAGNOSIS — G4733 Obstructive sleep apnea (adult) (pediatric): Secondary | ICD-10-CM | POA: Diagnosis not present

## 2024-06-10 DIAGNOSIS — K219 Gastro-esophageal reflux disease without esophagitis: Secondary | ICD-10-CM | POA: Diagnosis not present

## 2024-06-14 DIAGNOSIS — I10 Essential (primary) hypertension: Secondary | ICD-10-CM | POA: Diagnosis not present

## 2024-06-14 DIAGNOSIS — J439 Emphysema, unspecified: Secondary | ICD-10-CM | POA: Diagnosis not present

## 2024-06-20 DIAGNOSIS — J439 Emphysema, unspecified: Secondary | ICD-10-CM | POA: Diagnosis not present

## 2024-06-20 DIAGNOSIS — E7800 Pure hypercholesterolemia, unspecified: Secondary | ICD-10-CM | POA: Diagnosis not present

## 2024-06-20 DIAGNOSIS — I10 Essential (primary) hypertension: Secondary | ICD-10-CM | POA: Diagnosis not present

## 2024-06-21 DIAGNOSIS — I10 Essential (primary) hypertension: Secondary | ICD-10-CM | POA: Diagnosis not present

## 2024-06-24 DIAGNOSIS — H524 Presbyopia: Secondary | ICD-10-CM | POA: Diagnosis not present

## 2024-07-14 DIAGNOSIS — I10 Essential (primary) hypertension: Secondary | ICD-10-CM | POA: Diagnosis not present

## 2024-07-14 DIAGNOSIS — J439 Emphysema, unspecified: Secondary | ICD-10-CM | POA: Diagnosis not present

## 2024-07-21 DIAGNOSIS — I10 Essential (primary) hypertension: Secondary | ICD-10-CM | POA: Diagnosis not present

## 2024-07-21 DIAGNOSIS — J439 Emphysema, unspecified: Secondary | ICD-10-CM | POA: Diagnosis not present

## 2024-07-21 DIAGNOSIS — E7800 Pure hypercholesterolemia, unspecified: Secondary | ICD-10-CM | POA: Diagnosis not present

## 2024-07-29 DIAGNOSIS — M7060 Trochanteric bursitis, unspecified hip: Secondary | ICD-10-CM | POA: Diagnosis not present

## 2024-07-29 DIAGNOSIS — S335XXA Sprain of ligaments of lumbar spine, initial encounter: Secondary | ICD-10-CM | POA: Diagnosis not present

## 2024-08-13 DIAGNOSIS — J439 Emphysema, unspecified: Secondary | ICD-10-CM | POA: Diagnosis not present

## 2024-08-13 DIAGNOSIS — I10 Essential (primary) hypertension: Secondary | ICD-10-CM | POA: Diagnosis not present

## 2024-08-20 DIAGNOSIS — E7800 Pure hypercholesterolemia, unspecified: Secondary | ICD-10-CM | POA: Diagnosis not present

## 2024-08-20 DIAGNOSIS — I10 Essential (primary) hypertension: Secondary | ICD-10-CM | POA: Diagnosis not present

## 2024-08-20 DIAGNOSIS — J439 Emphysema, unspecified: Secondary | ICD-10-CM | POA: Diagnosis not present

## 2024-08-29 DIAGNOSIS — Z1231 Encounter for screening mammogram for malignant neoplasm of breast: Secondary | ICD-10-CM | POA: Diagnosis not present

## 2024-09-12 DIAGNOSIS — I10 Essential (primary) hypertension: Secondary | ICD-10-CM | POA: Diagnosis not present

## 2024-09-12 DIAGNOSIS — J439 Emphysema, unspecified: Secondary | ICD-10-CM | POA: Diagnosis not present

## 2024-09-20 DIAGNOSIS — E7800 Pure hypercholesterolemia, unspecified: Secondary | ICD-10-CM | POA: Diagnosis not present

## 2024-09-20 DIAGNOSIS — J439 Emphysema, unspecified: Secondary | ICD-10-CM | POA: Diagnosis not present

## 2024-09-20 DIAGNOSIS — I10 Essential (primary) hypertension: Secondary | ICD-10-CM | POA: Diagnosis not present

## 2024-10-12 DIAGNOSIS — I10 Essential (primary) hypertension: Secondary | ICD-10-CM | POA: Diagnosis not present

## 2024-10-12 DIAGNOSIS — J439 Emphysema, unspecified: Secondary | ICD-10-CM | POA: Diagnosis not present

## 2024-10-20 DIAGNOSIS — I10 Essential (primary) hypertension: Secondary | ICD-10-CM | POA: Diagnosis not present

## 2024-10-20 DIAGNOSIS — J439 Emphysema, unspecified: Secondary | ICD-10-CM | POA: Diagnosis not present

## 2024-10-20 DIAGNOSIS — E7800 Pure hypercholesterolemia, unspecified: Secondary | ICD-10-CM | POA: Diagnosis not present
